# Patient Record
Sex: Female | Born: 1955 | Race: Black or African American | Hispanic: No | Smoking: Never smoker
Health system: Southern US, Community
[De-identification: ages and names within clinical notes are randomized; demographics above are authoritative.]

## PROBLEM LIST (undated history)

## (undated) DIAGNOSIS — D649 Anemia, unspecified: Secondary | ICD-10-CM

## (undated) DIAGNOSIS — I10 Essential (primary) hypertension: Secondary | ICD-10-CM

## (undated) DIAGNOSIS — D573 Sickle-cell trait: Secondary | ICD-10-CM

## (undated) DIAGNOSIS — Z9289 Personal history of other medical treatment: Secondary | ICD-10-CM

---

## 1989-08-12 HISTORY — PX: DILATION AND CURETTAGE OF UTERUS: SHX78

## 1989-08-12 HISTORY — PX: VAGINAL HYSTERECTOMY: SUR661

## 2003-12-27 ENCOUNTER — Emergency Department (HOSPITAL_COMMUNITY): Admission: EM | Admit: 2003-12-27 | Discharge: 2003-12-27 | Payer: Self-pay | Admitting: Internal Medicine

## 2007-10-02 ENCOUNTER — Ambulatory Visit: Payer: Self-pay | Admitting: Family Medicine

## 2007-10-02 LAB — CONVERTED CEMR LAB
AST: 16 units/L (ref 0–37)
Albumin: 4.2 g/dL (ref 3.5–5.2)
BUN: 15 mg/dL (ref 6–23)
Basophils Relative: 1 % (ref 0–1)
CO2: 25 meq/L (ref 19–32)
Chloride: 107 meq/L (ref 96–112)
Creatinine, Ser: 1.04 mg/dL (ref 0.40–1.20)
Eosinophils Absolute: 0.1 10*3/uL (ref 0.0–0.7)
Glucose, Bld: 82 mg/dL (ref 70–99)
HCT: 38.6 % (ref 36.0–46.0)
Hemoglobin: 12.4 g/dL (ref 12.0–15.0)
LDL Cholesterol: 129 mg/dL — ABNORMAL HIGH (ref 0–99)
Lymphocytes Relative: 45 % (ref 12–46)
Lymphs Abs: 2 10*3/uL (ref 0.7–3.3)
Neutro Abs: 2.1 10*3/uL (ref 1.7–7.7)
Sodium: 144 meq/L (ref 135–145)
TSH: 2.848 microintl units/mL (ref 0.350–5.50)
Total Protein: 8 g/dL (ref 6.0–8.3)
VLDL: 20 mg/dL (ref 0–40)

## 2007-10-30 ENCOUNTER — Ambulatory Visit: Payer: Self-pay | Admitting: *Deleted

## 2007-11-02 ENCOUNTER — Ambulatory Visit: Payer: Self-pay | Admitting: Family Medicine

## 2008-01-10 ENCOUNTER — Ambulatory Visit: Payer: Self-pay | Admitting: Family Medicine

## 2008-05-20 ENCOUNTER — Ambulatory Visit: Payer: Self-pay | Admitting: Family Medicine

## 2008-05-20 ENCOUNTER — Encounter (INDEPENDENT_AMBULATORY_CARE_PROVIDER_SITE_OTHER): Payer: Self-pay | Admitting: Family Medicine

## 2009-06-25 ENCOUNTER — Ambulatory Visit: Payer: Self-pay | Admitting: Internal Medicine

## 2009-08-10 ENCOUNTER — Ambulatory Visit: Payer: Self-pay | Admitting: Family Medicine

## 2009-12-03 ENCOUNTER — Ambulatory Visit: Payer: Self-pay | Admitting: Family Medicine

## 2010-05-06 ENCOUNTER — Ambulatory Visit: Payer: Self-pay | Admitting: Family Medicine

## 2010-10-11 ENCOUNTER — Encounter (INDEPENDENT_AMBULATORY_CARE_PROVIDER_SITE_OTHER): Payer: Self-pay | Admitting: Family Medicine

## 2010-10-11 LAB — CONVERTED CEMR LAB
Alkaline Phosphatase: 53 units/L (ref 39–117)
CO2: 27 meq/L (ref 19–32)
Calcium: 9.2 mg/dL (ref 8.4–10.5)
Chloride: 106 meq/L (ref 96–112)
Cholesterol: 187 mg/dL (ref 0–200)
Eosinophils Absolute: 0.1 10*3/uL (ref 0.0–0.7)
Eosinophils Relative: 3 % (ref 0–5)
HCT: 38.6 % (ref 36.0–46.0)
Hemoglobin: 12.1 g/dL (ref 12.0–15.0)
Monocytes Absolute: 0.3 10*3/uL (ref 0.1–1.0)
Neutro Abs: 2.3 10*3/uL (ref 1.7–7.7)
Neutrophils Relative %: 51 % (ref 43–77)
Potassium: 4.2 meq/L (ref 3.5–5.3)
RBC: 4.81 M/uL (ref 3.87–5.11)
Sodium: 143 meq/L (ref 135–145)
Total CHOL/HDL Ratio: 4.9
VLDL: 21 mg/dL (ref 0–40)
WBC: 4.5 10*3/uL (ref 4.0–10.5)

## 2011-01-03 ENCOUNTER — Encounter: Payer: Self-pay | Admitting: Family Medicine

## 2012-01-19 ENCOUNTER — Encounter (HOSPITAL_COMMUNITY): Payer: Self-pay | Admitting: Emergency Medicine

## 2012-01-19 ENCOUNTER — Emergency Department (HOSPITAL_COMMUNITY)
Admission: EM | Admit: 2012-01-19 | Discharge: 2012-01-19 | Disposition: A | Discharge status: Home / Self Care | Payer: Self-pay | Attending: Emergency Medicine | Admitting: Emergency Medicine

## 2012-01-19 DIAGNOSIS — R21 Rash and other nonspecific skin eruption: Secondary | ICD-10-CM | POA: Insufficient documentation

## 2012-01-19 DIAGNOSIS — Z79899 Other long term (current) drug therapy: Secondary | ICD-10-CM | POA: Insufficient documentation

## 2012-01-19 DIAGNOSIS — B029 Zoster without complications: Secondary | ICD-10-CM | POA: Insufficient documentation

## 2012-01-19 DIAGNOSIS — I10 Essential (primary) hypertension: Secondary | ICD-10-CM | POA: Insufficient documentation

## 2012-01-19 HISTORY — DX: Essential (primary) hypertension: I10

## 2012-01-19 MED ORDER — ACYCLOVIR 400 MG PO TABS: 400.0000 mg | ORAL_TABLET | Freq: Four times a day (QID) | ORAL | Status: AC

## 2012-01-19 MED ORDER — PREDNISONE 10 MG PO TABS: ORAL_TABLET | ORAL | Status: DC

## 2012-01-19 NOTE — ED Notes (Signed)
Pt st's she had pain in back then developed a patchy rash on right upper back

## 2012-01-19 NOTE — ED Provider Notes (Signed)
History     CSN: [id number]  Arrival date & time 01/19/12  2053   First MD Initiated Contact with Patient 01/19/12 2322      Chief Complaint  Patient presents with  . Rash     HPI  History provided by the patient. Patient is a 56 year old female with history of hypertension presents with complaints of painful rash to her right flank and side. She states that she began to have some slight pain and burning sensation to her skin 4 days ago. Symptoms are moderate. She then began to have small patches of erythema and rash to her right back and flank and under her right breast. Patient denies having similar symptoms previously. Pain is worse with palpation. She has tried using hydrocortisone cream over the area without any significant change. She denies any fever, chills, sweats.    Past Medical History  Diagnosis Date  . Hypertension     Past Surgical History  Procedure Date  . Abdominal hysterectomy     No family history on file.  History  Substance Use Topics  . Smoking status: Never Smoker   . Smokeless tobacco: Not on file  . Alcohol Use: No    OB History    Grav Para Term Preterm Abortions TAB SAB Ect Mult Living                  Review of Systems  Constitutional: Negative for fever and chills.  Cardiovascular: Negative for chest pain.  Gastrointestinal: Negative for vomiting, abdominal pain and diarrhea.  Skin: Positive for rash.  All other systems reviewed and are negative.    Allergies  Aspirin  Home Medications   Current Outpatient Rx  Name Route Sig Dispense Refill  . HYDROCORTISONE 1 % EX CREA Topical Apply 1 application topically 2 (two) times daily.    . IBUPROFEN 200 MG PO TABS Oral Take 400 mg by mouth every 6 (six) hours as needed. For pain    . LISINOPRIL-HYDROCHLOROTHIAZIDE 20-25 MG PO TABS Oral Take 1 tablet by mouth daily.      BP 131/77  Pulse 61  Temp(Src) 98.2 F (36.8 C) (Oral)  Resp 16  SpO2 97%  Physical Exam  Nursing note  and vitals reviewed. Constitutional: She is oriented to person, place, and time. She appears well-developed and well-nourished. No distress.  Neck: Normal range of motion. Neck supple.       No meningeal signs  Cardiovascular: Normal rate and regular rhythm.   Pulmonary/Chest: Effort normal and breath sounds normal. No respiratory distress. She has no wheezes.  Neurological: She is alert and oriented to person, place, and time.  Skin: Skin is warm and dry. No rash noted.       Erythematous rash a to right back and flank side with a few spots under the right breast area. No vesicles or blisters seen at this time. Skin is sensitive to touch. No induration.  Psychiatric: She has a normal mood and affect. Her behavior is normal.    ED Course  Procedures     1. Shingles       MDM  11:00 PM patient seen and evaluated. Patient no acute distress.        Angus Seller, [place] 01/20/12 [phone]

## 2012-01-20 NOTE — ED Provider Notes (Signed)
Medical screening examination/treatment/procedure(s) were performed by non-physician practitioner and as supervising physician I was immediately available for consultation/collaboration.  Nicholes Stairs, MD 01/20/12 [phone]

## 2012-11-29 ENCOUNTER — Emergency Department (INDEPENDENT_AMBULATORY_CARE_PROVIDER_SITE_OTHER)
Admission: EM | Admit: 2012-11-29 | Discharge: 2012-11-29 | Disposition: A | Discharge status: Home / Self Care | Payer: No Typology Code available for payment source | Source: Home / Self Care

## 2012-11-29 ENCOUNTER — Encounter (HOSPITAL_COMMUNITY): Payer: Self-pay

## 2012-11-29 DIAGNOSIS — I10 Essential (primary) hypertension: Secondary | ICD-10-CM

## 2012-11-29 DIAGNOSIS — R799 Abnormal finding of blood chemistry, unspecified: Secondary | ICD-10-CM

## 2012-11-29 DIAGNOSIS — R7989 Other specified abnormal findings of blood chemistry: Secondary | ICD-10-CM

## 2012-11-29 LAB — COMPREHENSIVE METABOLIC PANEL
ALT: 13 U/L (ref 0–35)
AST: 20 U/L (ref 0–37)
Alkaline Phosphatase: 56 U/L (ref 39–117)
CO2: 27 mEq/L (ref 19–32)
Chloride: 101 mEq/L (ref 96–112)
Creatinine, Ser: 1.13 mg/dL — ABNORMAL HIGH (ref 0.50–1.10)
GFR calc non Af Amer: 54 mL/min — ABNORMAL LOW (ref >90)
Potassium: 3.3 mEq/L — ABNORMAL LOW (ref 3.5–5.1)
Total Bilirubin: 0.5 mg/dL (ref 0.3–1.2)

## 2012-11-29 LAB — LIPID PANEL
LDL Cholesterol: 133 mg/dL — ABNORMAL HIGH (ref 0–99)
Triglycerides: 109 mg/dL (ref <150)
VLDL: 22 mg/dL (ref 0–40)

## 2012-11-29 MED ORDER — LISINOPRIL-HYDROCHLOROTHIAZIDE 20-25 MG PO TABS: 1.0000 | ORAL_TABLET | Freq: Every day | ORAL | Status: DC

## 2012-11-29 NOTE — ED Provider Notes (Signed)
History     CSN: [id number]  Arrival date & time 11/29/12  1508   Chief Complaint  Patient presents with  . Medication Refill   HPI Pt reports that she has been well.  No complaints.  Pt says that she was told 2 years ago that her creatinine was elevated but has not had it checked recently.  Pt also reports that her teeth need to be pulled.      Past Medical History  Diagnosis Date  . Hypertension     Past Surgical History  Procedure Date  . Abdominal hysterectomy     No family history on file.  History  Substance Use Topics  . Smoking status: Never Smoker   . Smokeless tobacco: Not on file  . Alcohol Use: No    OB History    Grav Para Term Preterm Abortions TAB SAB Ect Mult Living                  Review of Systems  Constitutional: Negative.   HENT: Negative.   Eyes: Negative.   Respiratory: Negative.   Gastrointestinal: Negative.   Musculoskeletal: Negative.   Neurological: Negative.   Psychiatric/Behavioral: Negative.   All other systems reviewed and are negative.    Allergies  Aspirin  Home Medications   Current Outpatient Rx  Name  Route  Sig  Dispense  Refill  . LISINOPRIL-HYDROCHLOROTHIAZIDE 20-25 MG PO TABS   Oral   Take 1 tablet by mouth daily.         Marland Kitchen HYDROCORTISONE 1 % EX CREA   Topical   Apply 1 application topically 2 (two) times daily.         . IBUPROFEN 200 MG PO TABS   Oral   Take 400 mg by mouth every 6 (six) hours as needed. For pain         . PREDNISONE 10 MG PO TABS      Take 6 tablets on day one, take 5 times on day 2, take Fortaz on day 3, take 3 tabs on day 4, take 2 tabs on day 5, take 1 tab on day 6   21 tablet   0     BP 136/84  Pulse 65  Temp 97.9 F (36.6 C) (Oral)  Resp 19  SpO2 100%  Physical Exam  Nursing note and vitals reviewed. Constitutional: She is oriented to person, place, and time. She appears well-developed and well-nourished. No distress.  HENT:  Head: Normocephalic and  atraumatic.  Eyes: EOM are normal. Pupils are equal, round, and reactive to light.  Neck: Normal range of motion. Neck supple.  Cardiovascular: Normal rate, regular rhythm and normal heart sounds.   Pulmonary/Chest: Effort normal and breath sounds normal.  Abdominal: Soft. Bowel sounds are normal.  Musculoskeletal: Normal range of motion. She exhibits no edema.  Neurological: She is alert and oriented to person, place, and time.  Skin: Skin is warm and dry. No erythema.  Psychiatric: She has a normal mood and affect. Her behavior is normal. Judgment and thought content normal.    ED Course  Procedures (including critical care time)  Labs Reviewed - No data to display No results found.   No diagnosis found.  MDM  IMPRESSION  hYPERTENSION  HISTORY OF ELEVATED CREATININE  DENTAL CARIES  RECOMMENDATIONS / PLAN DENTAL REFERRAL REFILLED LISINOPRIL/HCTZ 20/25 TAKE 1 PO DAILY   FOLLOW UP 3 MONTHS  The patient was given clear instructions to go to ER or return to  medical center if symptoms don't improve, worsen or new problems develop.  The patient verbalized understanding.  The patient was told to call to get lab results if they haven't heard anything in the next week.            Cleora Fleet, MD 11/29/12 [phone]

## 2012-11-29 NOTE — ED Notes (Signed)
[facility] client- needs medication refill and blood pressure checked

## 2012-12-03 ENCOUNTER — Telehealth (HOSPITAL_COMMUNITY): Payer: Self-pay

## 2012-12-03 NOTE — Telephone Encounter (Signed)
Message copied by Lestine Mount on Mon Dec 03, 2012  3:19 PM ------      Message from: Cleora Fleet      Created: Fri Nov 30, 2012  9:32 PM       Please notify patient that her kidney function is mildly diminished.  Her potassium is a little low and she should take some supplemental potassium with her blood pressure medication.  Please call in KCl 10 meq to take 1 tablet po daily, #30, RFx3.  Her cholesterol was a little elevated but not too bad.   Recheck labs in 3 months.                    Rodney Langton, MD, CDE, FAAFP      [facility]      [facility]      [place], [place]

## 2012-12-03 NOTE — Telephone Encounter (Signed)
Message copied by Lestine Mount on Mon Dec 03, 2012  3:26 PM ------      Message from: Cleora Fleet      Created: Fri Nov 30, 2012  9:32 PM       Please notify patient that her kidney function is mildly diminished.  Her potassium is a little low and she should take some supplemental potassium with her blood pressure medication.  Please call in KCl 10 meq to take 1 tablet po daily, #30, RFx3.  Her cholesterol was a little elevated but not too bad.   Recheck labs in 3 months.                    Rodney Langton, MD, CDE, FAAFP      [facility]      [facility]      [place], [place]

## 2013-06-21 ENCOUNTER — Other Ambulatory Visit (HOSPITAL_COMMUNITY): Payer: Self-pay | Admitting: Internal Medicine

## 2013-06-21 DIAGNOSIS — Z1231 Encounter for screening mammogram for malignant neoplasm of breast: Secondary | ICD-10-CM

## 2013-07-03 ENCOUNTER — Ambulatory Visit (HOSPITAL_COMMUNITY): Payer: No Typology Code available for payment source

## 2013-10-17 ENCOUNTER — Other Ambulatory Visit: Payer: Self-pay

## 2015-03-20 ENCOUNTER — Observation Stay (HOSPITAL_COMMUNITY): Payer: No Typology Code available for payment source

## 2015-03-20 ENCOUNTER — Observation Stay (HOSPITAL_COMMUNITY)
Admission: EM | Admit: 2015-03-20 | Discharge: 2015-03-21 | Disposition: A | Discharge status: Home / Self Care | Payer: Self-pay | Attending: Surgery | Admitting: Surgery

## 2015-03-20 ENCOUNTER — Encounter (HOSPITAL_COMMUNITY)
Admission: EM | Disposition: A | Discharge status: Home / Self Care | Payer: Self-pay | Source: Home / Self Care | Attending: Emergency Medicine

## 2015-03-20 ENCOUNTER — Observation Stay (HOSPITAL_COMMUNITY): Payer: Self-pay | Admitting: Anesthesiology

## 2015-03-20 ENCOUNTER — Emergency Department (HOSPITAL_COMMUNITY): Payer: Self-pay

## 2015-03-20 ENCOUNTER — Encounter (HOSPITAL_COMMUNITY): Payer: Self-pay | Admitting: Emergency Medicine

## 2015-03-20 DIAGNOSIS — J449 Chronic obstructive pulmonary disease, unspecified: Secondary | ICD-10-CM | POA: Insufficient documentation

## 2015-03-20 DIAGNOSIS — E876 Hypokalemia: Secondary | ICD-10-CM | POA: Diagnosis present

## 2015-03-20 DIAGNOSIS — Z6841 Body Mass Index (BMI) 40.0 and over, adult: Secondary | ICD-10-CM | POA: Insufficient documentation

## 2015-03-20 DIAGNOSIS — K8000 Calculus of gallbladder with acute cholecystitis without obstruction: Principal | ICD-10-CM | POA: Diagnosis present

## 2015-03-20 DIAGNOSIS — Z886 Allergy status to analgesic agent status: Secondary | ICD-10-CM | POA: Insufficient documentation

## 2015-03-20 DIAGNOSIS — K420 Umbilical hernia with obstruction, without gangrene: Secondary | ICD-10-CM | POA: Insufficient documentation

## 2015-03-20 DIAGNOSIS — F17210 Nicotine dependence, cigarettes, uncomplicated: Secondary | ICD-10-CM | POA: Insufficient documentation

## 2015-03-20 DIAGNOSIS — Z90710 Acquired absence of both cervix and uterus: Secondary | ICD-10-CM | POA: Insufficient documentation

## 2015-03-20 DIAGNOSIS — I129 Hypertensive chronic kidney disease with stage 1 through stage 4 chronic kidney disease, or unspecified chronic kidney disease: Secondary | ICD-10-CM | POA: Insufficient documentation

## 2015-03-20 DIAGNOSIS — I10 Essential (primary) hypertension: Secondary | ICD-10-CM | POA: Diagnosis present

## 2015-03-20 DIAGNOSIS — K8020 Calculus of gallbladder without cholecystitis without obstruction: Secondary | ICD-10-CM

## 2015-03-20 DIAGNOSIS — K829 Disease of gallbladder, unspecified: Secondary | ICD-10-CM

## 2015-03-20 DIAGNOSIS — R1110 Vomiting, unspecified: Secondary | ICD-10-CM

## 2015-03-20 DIAGNOSIS — N189 Chronic kidney disease, unspecified: Secondary | ICD-10-CM | POA: Diagnosis present

## 2015-03-20 HISTORY — PX: CHOLECYSTECTOMY: SHX55

## 2015-03-20 HISTORY — DX: Anemia, unspecified: D64.9

## 2015-03-20 HISTORY — DX: Personal history of other medical treatment: Z92.89

## 2015-03-20 HISTORY — PX: LAPAROSCOPIC CHOLECYSTECTOMY: SUR755

## 2015-03-20 HISTORY — DX: Sickle-cell trait: D57.3

## 2015-03-20 LAB — COMPREHENSIVE METABOLIC PANEL
ALT: 14 U/L (ref 0–35)
ANION GAP: 11 (ref 5–15)
AST: 23 U/L (ref 0–37)
Albumin: 3.7 g/dL (ref 3.5–5.2)
Alkaline Phosphatase: 51 U/L (ref 39–117)
BUN: 17 mg/dL (ref 6–23)
CALCIUM: 9 mg/dL (ref 8.4–10.5)
CO2: 25 mmol/L (ref 19–32)
Chloride: 105 mmol/L (ref 96–112)
Creatinine, Ser: 1.17 mg/dL — ABNORMAL HIGH (ref 0.50–1.10)
GFR, EST AFRICAN AMERICAN: 59 mL/min — AB (ref >90)
GFR, EST NON AFRICAN AMERICAN: 51 mL/min — AB (ref >90)
GLUCOSE: 122 mg/dL — AB (ref 70–99)
Potassium: 3.4 mmol/L — ABNORMAL LOW (ref 3.5–5.1)
Sodium: 141 mmol/L (ref 135–145)
TOTAL PROTEIN: 7.8 g/dL (ref 6.0–8.3)
Total Bilirubin: 0.8 mg/dL (ref 0.3–1.2)

## 2015-03-20 LAB — CBC WITH DIFFERENTIAL/PLATELET
Basophils Absolute: 0 10*3/uL (ref 0.0–0.1)
Basophils Relative: 0 % (ref 0–1)
EOS ABS: 0.1 10*3/uL (ref 0.0–0.7)
EOS PCT: 2 % (ref 0–5)
HEMATOCRIT: 39 % (ref 36.0–46.0)
Hemoglobin: 12.7 g/dL (ref 12.0–15.0)
LYMPHS ABS: 2 10*3/uL (ref 0.7–4.0)
LYMPHS PCT: 35 % (ref 12–46)
MCH: 25 pg — AB (ref 26.0–34.0)
MCHC: 32.6 g/dL (ref 30.0–36.0)
MCV: 76.8 fL — AB (ref 78.0–100.0)
MONO ABS: 0.3 10*3/uL (ref 0.1–1.0)
Monocytes Relative: 4 % (ref 3–12)
Neutro Abs: 3.3 10*3/uL (ref 1.7–7.7)
Neutrophils Relative %: 59 % (ref 43–77)
Platelets: 228 10*3/uL (ref 150–400)
RBC: 5.08 MIL/uL (ref 3.87–5.11)
RDW: 14.5 % (ref 11.5–15.5)
WBC: 5.7 10*3/uL (ref 4.0–10.5)

## 2015-03-20 LAB — TROPONIN I: Troponin I: <0.03 ng/mL (ref <0.031)

## 2015-03-20 LAB — LIPASE, BLOOD: Lipase: 27 U/L (ref 11–59)

## 2015-03-20 LAB — I-STAT CG4 LACTIC ACID, ED: Lactic Acid, Venous: 2.4 mmol/L (ref 0.5–2.0)

## 2015-03-20 SURGERY — LAPAROSCOPIC CHOLECYSTECTOMY WITH INTRAOPERATIVE CHOLANGIOGRAM
Anesthesia: General | Site: Abdomen

## 2015-03-20 MED ORDER — ONDANSETRON HCL 4 MG/2ML IJ SOLN
INTRAMUSCULAR | Status: DC | PRN
  Administered 2015-03-20: 4 mg via INTRAVENOUS

## 2015-03-20 MED ORDER — PHENYLEPHRINE HCL 10 MG/ML IJ SOLN
INTRAMUSCULAR | Status: DC | PRN
  Administered 2015-03-20: 80 ug via INTRAVENOUS

## 2015-03-20 MED ORDER — BUPIVACAINE HCL (PF) 0.25 % IJ SOLN
INTRAMUSCULAR | Status: AC
  Filled 2015-03-20: qty 30

## 2015-03-20 MED ORDER — LISINOPRIL 20 MG PO TABS
20.0000 mg | ORAL_TABLET | Freq: Every day | ORAL | Status: DC
  Administered 2015-03-20 – 2015-03-21 (×2): 20 mg via ORAL
  Filled 2015-03-20 (×2): qty 1

## 2015-03-20 MED ORDER — PHENOL 1.4 % MT LIQD
1.0000 | OROMUCOSAL | Status: DC | PRN
  Administered 2015-03-20 – 2015-03-21 (×2): 1 via OROMUCOSAL
  Filled 2015-03-20: qty 177

## 2015-03-20 MED ORDER — ACETAMINOPHEN 325 MG PO TABS: 650.0000 mg | ORAL_TABLET | Freq: Four times a day (QID) | ORAL | Status: DC | PRN

## 2015-03-20 MED ORDER — PROMETHAZINE HCL 25 MG/ML IJ SOLN: 6.2500 mg | INTRAMUSCULAR | Status: DC | PRN

## 2015-03-20 MED ORDER — MORPHINE SULFATE 2 MG/ML IJ SOLN: 2.0000 mg | INTRAMUSCULAR | Status: DC | PRN

## 2015-03-20 MED ORDER — ONDANSETRON HCL 4 MG/2ML IJ SOLN: 4.0000 mg | Freq: Four times a day (QID) | INTRAMUSCULAR | Status: DC | PRN

## 2015-03-20 MED ORDER — HYDROMORPHONE HCL 1 MG/ML IJ SOLN
0.2500 mg | INTRAMUSCULAR | Status: DC | PRN
  Administered 2015-03-20: 0.5 mg via INTRAVENOUS

## 2015-03-20 MED ORDER — GLYCOPYRROLATE 0.2 MG/ML IJ SOLN
INTRAMUSCULAR | Status: DC | PRN
  Administered 2015-03-20: 0.4 mg via INTRAVENOUS

## 2015-03-20 MED ORDER — ENOXAPARIN SODIUM 40 MG/0.4ML ~~LOC~~ SOLN: 40.0000 mg | SUBCUTANEOUS | Status: DC

## 2015-03-20 MED ORDER — MIDAZOLAM HCL 5 MG/5ML IJ SOLN
INTRAMUSCULAR | Status: DC | PRN
  Administered 2015-03-20: 2 mg via INTRAVENOUS

## 2015-03-20 MED ORDER — PROPOFOL 10 MG/ML IV BOLUS
INTRAVENOUS | Status: AC
  Filled 2015-03-20: qty 20

## 2015-03-20 MED ORDER — ACETAMINOPHEN 10 MG/ML IV SOLN
1000.0000 mg | Freq: Once | INTRAVENOUS | Status: AC
Start: 2015-03-20 — End: 2015-03-20
  Administered 2015-03-20: 1000 mg via INTRAVENOUS

## 2015-03-20 MED ORDER — HYDROMORPHONE HCL 1 MG/ML IJ SOLN
1.0000 mg | Freq: Once | INTRAMUSCULAR | Status: AC
  Administered 2015-03-20: 1 mg via INTRAVENOUS
  Filled 2015-03-20: qty 1

## 2015-03-20 MED ORDER — ACETAMINOPHEN 10 MG/ML IV SOLN
INTRAVENOUS | Status: AC
  Filled 2015-03-20: qty 100

## 2015-03-20 MED ORDER — HYOSCYAMINE SULFATE 0.125 MG PO TABS
0.1250 mg | ORAL_TABLET | Freq: Once | ORAL | Status: DC
  Filled 2015-03-20: qty 1

## 2015-03-20 MED ORDER — SODIUM CHLORIDE 0.9 % IV SOLN
INTRAVENOUS | Status: DC | PRN
  Administered 2015-03-20: 14 mL

## 2015-03-20 MED ORDER — SUCCINYLCHOLINE CHLORIDE 20 MG/ML IJ SOLN
INTRAMUSCULAR | Status: DC | PRN
  Administered 2015-03-20: 160 mg via INTRAVENOUS

## 2015-03-20 MED ORDER — ACETAMINOPHEN 650 MG RE SUPP: 650.0000 mg | Freq: Four times a day (QID) | RECTAL | Status: DC | PRN

## 2015-03-20 MED ORDER — FENTANYL CITRATE 0.05 MG/ML IJ SOLN
INTRAMUSCULAR | Status: DC | PRN
  Administered 2015-03-20 (×3): 50 ug via INTRAVENOUS
  Administered 2015-03-20: 100 ug via INTRAVENOUS

## 2015-03-20 MED ORDER — HYDROMORPHONE HCL 1 MG/ML IJ SOLN
INTRAMUSCULAR | Status: AC
  Filled 2015-03-20: qty 1

## 2015-03-20 MED ORDER — PROPOFOL 10 MG/ML IV BOLUS
INTRAVENOUS | Status: DC | PRN
  Administered 2015-03-20: 200 mg via INTRAVENOUS

## 2015-03-20 MED ORDER — 0.9 % SODIUM CHLORIDE (POUR BTL) OPTIME
TOPICAL | Status: DC | PRN
  Administered 2015-03-20: 1000 mL

## 2015-03-20 MED ORDER — BUPIVACAINE HCL 0.25 % IJ SOLN
INTRAMUSCULAR | Status: DC | PRN
  Administered 2015-03-20: 30 mL

## 2015-03-20 MED ORDER — FENTANYL CITRATE 0.05 MG/ML IJ SOLN
INTRAMUSCULAR | Status: AC
  Filled 2015-03-20: qty 5

## 2015-03-20 MED ORDER — HYDROMORPHONE HCL 1 MG/ML IJ SOLN
1.0000 mg | Freq: Once | INTRAMUSCULAR | Status: AC
  Administered 2015-03-20: 0.5 mg via INTRAVENOUS
  Filled 2015-03-20: qty 1

## 2015-03-20 MED ORDER — PROMETHAZINE HCL 25 MG/ML IJ SOLN
25.0000 mg | Freq: Once | INTRAMUSCULAR | Status: AC
  Administered 2015-03-20: 25 mg via INTRAVENOUS
  Filled 2015-03-20: qty 1

## 2015-03-20 MED ORDER — ROCURONIUM BROMIDE 100 MG/10ML IV SOLN
INTRAVENOUS | Status: DC | PRN
  Administered 2015-03-20: 40 mg via INTRAVENOUS

## 2015-03-20 MED ORDER — ONDANSETRON HCL 4 MG/2ML IJ SOLN
4.0000 mg | Freq: Once | INTRAMUSCULAR | Status: AC
  Administered 2015-03-20: 4 mg via INTRAVENOUS
  Filled 2015-03-20: qty 2

## 2015-03-20 MED ORDER — LACTATED RINGERS IV SOLN: INTRAVENOUS | Status: DC

## 2015-03-20 MED ORDER — LIDOCAINE HCL (CARDIAC) 20 MG/ML IV SOLN
INTRAVENOUS | Status: DC | PRN
  Administered 2015-03-20: 50 mg via INTRAVENOUS

## 2015-03-20 MED ORDER — KCL IN DEXTROSE-NACL 20-5-0.9 MEQ/L-%-% IV SOLN
INTRAVENOUS | Status: DC
  Administered 2015-03-20 – 2015-03-21 (×2): via INTRAVENOUS
  Filled 2015-03-20 (×3): qty 1000

## 2015-03-20 MED ORDER — DEXTROSE 5 % IV SOLN
2.0000 g | INTRAVENOUS | Status: DC
  Administered 2015-03-20: 2 g via INTRAVENOUS
  Filled 2015-03-20: qty 2

## 2015-03-20 MED ORDER — LACTATED RINGERS IV SOLN
INTRAVENOUS | Status: DC | PRN
  Administered 2015-03-20 (×2): via INTRAVENOUS

## 2015-03-20 MED ORDER — HYDROCHLOROTHIAZIDE 25 MG PO TABS
25.0000 mg | ORAL_TABLET | Freq: Every day | ORAL | Status: DC
  Administered 2015-03-20 – 2015-03-21 (×2): 25 mg via ORAL
  Filled 2015-03-20 (×2): qty 1

## 2015-03-20 MED ORDER — MEPERIDINE HCL 25 MG/ML IJ SOLN: 6.2500 mg | INTRAMUSCULAR | Status: DC | PRN

## 2015-03-20 MED ORDER — HYOSCYAMINE SULFATE 0.125 MG SL SUBL
0.1250 mg | SUBLINGUAL_TABLET | Freq: Once | SUBLINGUAL | Status: AC
Start: 2015-03-20 — End: 2015-03-20
  Administered 2015-03-20: 0.125 mg via ORAL

## 2015-03-20 MED ORDER — HYDROCODONE-ACETAMINOPHEN 5-325 MG PO TABS
1.0000 | ORAL_TABLET | ORAL | Status: DC | PRN
  Administered 2015-03-20 – 2015-03-21 (×2): 2 via ORAL
  Filled 2015-03-20 (×2): qty 2

## 2015-03-20 MED ORDER — KCL IN DEXTROSE-NACL 20-5-0.9 MEQ/L-%-% IV SOLN
INTRAVENOUS | Status: DC
  Administered 2015-03-20: 09:00:00 via INTRAVENOUS
  Filled 2015-03-20 (×3): qty 1000

## 2015-03-20 MED ORDER — LISINOPRIL-HYDROCHLOROTHIAZIDE 20-25 MG PO TABS: 1.0000 | ORAL_TABLET | Freq: Every day | ORAL | Status: DC

## 2015-03-20 MED ORDER — MORPHINE SULFATE 4 MG/ML IJ SOLN
4.0000 mg | Freq: Once | INTRAMUSCULAR | Status: AC
Start: 2015-03-20 — End: 2015-03-20
  Administered 2015-03-20: 4 mg via INTRAVENOUS
  Filled 2015-03-20: qty 1

## 2015-03-20 MED ORDER — MIDAZOLAM HCL 2 MG/2ML IJ SOLN
INTRAMUSCULAR | Status: AC
  Filled 2015-03-20: qty 2

## 2015-03-20 MED ORDER — NEOSTIGMINE METHYLSULFATE 10 MG/10ML IV SOLN
INTRAVENOUS | Status: DC | PRN
  Administered 2015-03-20: 3 mg via INTRAVENOUS

## 2015-03-20 MED ORDER — SODIUM CHLORIDE 0.9 % IR SOLN
Status: DC | PRN
  Administered 2015-03-20: 1000 mL

## 2015-03-20 SURGICAL SUPPLY — 57 items
APPLIER CLIP 5 13 M/L LIGAMAX5 (MISCELLANEOUS)
APPLIER CLIP ROT 10 11.4 M/L (STAPLE)
APR CLP MED LRG 11.4X10 (STAPLE)
APR CLP MED LRG 5 ANG JAW (MISCELLANEOUS)
BAG SPEC RTRVL LRG 6X4 10 (ENDOMECHANICALS) ×1
BLADE SURG ROTATE 9660 (MISCELLANEOUS) IMPLANT
CANISTER SUCTION 2500CC (MISCELLANEOUS) ×3 IMPLANT
CHLORAPREP W/TINT 26ML (MISCELLANEOUS) ×3 IMPLANT
CHOLANGIOGRAM CATH TAUT (CATHETERS) ×3 IMPLANT
CLIP APPLIE 5 13 M/L LIGAMAX5 (MISCELLANEOUS) IMPLANT
CLIP APPLIE ROT 10 11.4 M/L (STAPLE) IMPLANT
COVER MAYO STAND STRL (DRAPES) ×3 IMPLANT
COVER SURGICAL LIGHT HANDLE (MISCELLANEOUS) ×3 IMPLANT
DEVICE TROCAR PUNCTURE CLOSURE (ENDOMECHANICALS) ×2 IMPLANT
DRAPE C-ARM 42X72 X-RAY (DRAPES) ×3 IMPLANT
DRAPE LAPAROSCOPIC ABDOMINAL (DRAPES) ×3 IMPLANT
ELECT REM PT RETURN 9FT ADLT (ELECTROSURGICAL) ×3
ELECTRODE REM PT RTRN 9FT ADLT (ELECTROSURGICAL) ×1 IMPLANT
FILTER SMOKE EVAC LAPAROSHD (FILTER) ×3 IMPLANT
GLOVE BIOGEL PI IND STRL 6.5 (GLOVE) IMPLANT
GLOVE BIOGEL PI IND STRL 7.5 (GLOVE) IMPLANT
GLOVE BIOGEL PI IND STRL 8 (GLOVE) IMPLANT
GLOVE BIOGEL PI INDICATOR 6.5 (GLOVE) ×2
GLOVE BIOGEL PI INDICATOR 7.5 (GLOVE) ×2
GLOVE BIOGEL PI INDICATOR 8 (GLOVE) ×2
GLOVE ECLIPSE 6.5 STRL STRAW (GLOVE) ×2 IMPLANT
GLOVE ECLIPSE 7.0 STRL STRAW (GLOVE) ×2 IMPLANT
GLOVE SURG SIGNA 7.5 PF LTX (GLOVE) ×3 IMPLANT
GLOVE SURG SS PI 7.5 STRL IVOR (GLOVE) ×2 IMPLANT
GOWN STRL REUS W/ TWL LRG LVL3 (GOWN DISPOSABLE) ×2 IMPLANT
GOWN STRL REUS W/ TWL XL LVL3 (GOWN DISPOSABLE) ×1 IMPLANT
GOWN STRL REUS W/TWL LRG LVL3 (GOWN DISPOSABLE) ×9
GOWN STRL REUS W/TWL XL LVL3 (GOWN DISPOSABLE) ×3
IV CATH 14GX2 1/4 (CATHETERS) ×3 IMPLANT
KIT BASIN OR (CUSTOM PROCEDURE TRAY) ×3 IMPLANT
KIT ROOM TURNOVER OR (KITS) ×3 IMPLANT
LIQUID BAND (GAUZE/BANDAGES/DRESSINGS) ×3 IMPLANT
NS IRRIG 1000ML POUR BTL (IV SOLUTION) ×3 IMPLANT
PAD ARMBOARD 7.5X6 YLW CONV (MISCELLANEOUS) ×3 IMPLANT
POUCH SPECIMEN RETRIEVAL 10MM (ENDOMECHANICALS) ×3 IMPLANT
SCISSORS LAP 5X35 DISP (ENDOMECHANICALS) ×3 IMPLANT
SET IRRIG TUBING LAPAROSCOPIC (IRRIGATION / IRRIGATOR) ×3 IMPLANT
SLEEVE ENDOPATH XCEL 5M (ENDOMECHANICALS) ×3 IMPLANT
SPECIMEN JAR SMALL (MISCELLANEOUS) ×3 IMPLANT
STAPLER VISISTAT 35W (STAPLE) ×2 IMPLANT
STOPCOCK 4 WAY LG BORE MALE ST (IV SETS) ×3 IMPLANT
SUT MNCRL AB 4-0 PS2 18 (SUTURE) ×2 IMPLANT
SUT MON AB 5-0 PS2 18 (SUTURE) ×3 IMPLANT
SUT VICRYL 0 UR6 27IN ABS (SUTURE) ×2 IMPLANT
TOWEL OR 17X24 6PK STRL BLUE (TOWEL DISPOSABLE) ×3 IMPLANT
TOWEL OR 17X26 10 PK STRL BLUE (TOWEL DISPOSABLE) ×3 IMPLANT
TRAY LAPAROSCOPIC (CUSTOM PROCEDURE TRAY) ×3 IMPLANT
TROCAR XCEL BLUNT TIP 100MML (ENDOMECHANICALS) ×3 IMPLANT
TROCAR XCEL NON-BLD 11X100MML (ENDOMECHANICALS) ×2 IMPLANT
TROCAR XCEL NON-BLD 5MMX100MML (ENDOMECHANICALS) ×3 IMPLANT
TUBING EXTENTION W/L.L. (IV SETS) ×3 IMPLANT
TUBING INSUFFLATION (TUBING) ×3 IMPLANT

## 2015-03-20 NOTE — ED Provider Notes (Signed)
CSN: [id number]     Arrival date & time 03/20/15  0158 History  This chart was scribed for Marisa Severin, MD by Annye Asa, ED Scribe. This patient was seen in room B15C/B15C and the patient's care was started at 2:26 AM.    Chief Complaint  Patient presents with  . Chest Pain   Patient is a 59 y.o. female presenting with chest pain. The history is provided by the patient. No language interpreter was used.  Chest Pain Associated symptoms: dizziness, nausea, shortness of breath and vomiting    HPI Comments: Aeliana Spates is a 59 y.o. female who presents to the Emergency Department complaining of gradually worsening, constant "sharp" chest pain beginning around 23:00. Patient localizes her pain in her epigastric region, radiating across her right upper flank and into her back; it is exacerbated with applied pressure. She also notes SOB, dizziness and vomiting (3x, beginning en route). Per EMS, patient received  Zofran IM en route; no ASA given due to allergy.   She is a nonsmoker. Prior surgical history includes abdominal hysterectomy; still has both appendix and gallbladder. She denies personal history of gallbladder concerns.   Past Medical History  Diagnosis Date  . Hypertension    Past Surgical History  Procedure Laterality Date  . Abdominal hysterectomy     History reviewed. No pertinent family history. History  Substance Use Topics  . Smoking status: Never Smoker   . Smokeless tobacco: Not on file  . Alcohol Use: No   OB History    No data available     Review of Systems  Respiratory: Positive for shortness of breath.   Cardiovascular: Positive for chest pain.  Gastrointestinal: Positive for nausea and vomiting.  Neurological: Positive for dizziness.  All other systems reviewed and are negative.  Allergies  Aspirin  Home Medications   Prior to Admission medications   Medication Sig Start Date End Date Taking? Authorizing Provider  lisinopril-hydrochlorothiazide  (PRINZIDE,ZESTORETIC) 20-25 MG per tablet Take 1 tablet by mouth daily. 11/29/12  Yes Clanford L Johnson, MD   BP 143/99 mmHg  Pulse 86  Resp 21  SpO2 99% Physical Exam  Constitutional: She is oriented to person, place, and time. She appears well-developed and well-nourished. No distress.  HENT:  Head: Normocephalic and atraumatic.  Mouth/Throat: Oropharynx is clear and moist. No oropharyngeal exudate.  Moist mucous membranes  Eyes: EOM are normal. Pupils are equal, round, and reactive to light.  Neck: Normal range of motion. Neck supple. No JVD present.  Cardiovascular: Normal rate, regular rhythm and normal heart sounds.  Exam reveals no gallop and no friction rub.   No murmur heard. Pulmonary/Chest: Effort normal and breath sounds normal. No respiratory distress. She has no wheezes. She has no rales.  Abdominal: Soft. Bowel sounds are normal. She exhibits no mass. There is tenderness (Epigastrium, LUQ and RUQ). There is no rebound and no guarding.  Musculoskeletal: Normal range of motion. She exhibits no edema.  Moves all extremities normally.   Lymphadenopathy:    She has no cervical adenopathy.  Neurological: She is alert and oriented to person, place, and time. She displays normal reflexes.  Skin: Skin is warm and dry. No rash noted.  Psychiatric: She has a normal mood and affect. Her behavior is normal.  Nursing note and vitals reviewed.   ED Course  Procedures   DIAGNOSTIC STUDIES: Oxygen Saturation is 99% on 2L Carlos, normal by my interpretation.    COORDINATION OF CARE: 2:33 AM Discussed treatment plan  with pt at bedside and pt agreed to plan.   Labs Review Labs Reviewed  COMPREHENSIVE METABOLIC PANEL - Abnormal; Notable for the following:    Potassium 3.4 (*)    Glucose, Bld 122 (*)    Creatinine, Ser 1.17 (*)    GFR calc non Af Amer 51 (*)    GFR calc Af Amer 59 (*)    All other components within normal limits  CBC WITH DIFFERENTIAL/PLATELET - Abnormal; Notable  for the following:    MCV 76.8 (*)    MCH 25.0 (*)    All other components within normal limits  I-STAT CG4 LACTIC ACID, ED - Abnormal; Notable for the following:    Lactic Acid, Venous 2.40 (*)    All other components within normal limits  TROPONIN I  LIPASE, BLOOD    Imaging Review [place] Abdomen Limited  03/20/2015   CLINICAL DATA:  Right upper quadrant pain.  EXAM: US ABDOMEN LIMITED - RIGHT UPPER QUADRANT  COMPARISON:  None.  FINDINGS: Gallbladder:  Multiple stones are demonstrated throughout the dependent portion of the gallbladder. Largest measures about 1.5 cm diameter. No gallbladder wall thickening, sludge, or edema. Murphy's sign is negative.  Common bile duct:  Not visualized.  No visible intrahepatic bile duct dilatation.  Liver:  Diffusely increased parenchymal echotexture suggesting fatty infiltration. Several cysts are demonstrated in the liver, largest measuring about 2.2 cm maximal diameter.  Examination is technically limited due to patient body habitus, bowel gas, and patient's movement.  IMPRESSION: Cholelithiasis without additional changes to suggest cholecystitis. Diffuse fatty infiltration of the liver. Multiple hepatic cysts. Bile ducts not identified.   Electronically Signed   By: Burman NievesWilliam  Stevens M.D.   On: 03/20/2015 04:05     EKG Interpretation   Date/Time:  Friday March 20 2015 [id number]:10:[id number]:24 EDT Ventricular Rate:  80 PR Interval:    QRS Duration: 97 QT Interval:  360 QTC Calculation: 415 R Axis:   32 Text Interpretation:  Normal sinus rhythm Abnormal R-wave progression,  early transition No old tracing to compare Confirmed by Lakeia Bradshaw  MD, Tarquin Welcher  ([id number]) on 03/20/2015 2:34:19 AM      MDM   Final diagnoses:  Cholelithiasis without cholecystitis  Intractable vomiting with nausea, vomiting of unspecified type    I personally performed the services described in this documentation, which was scribed in my presence. The recorded information has been reviewed and is  accurate.   Pt with acute onset of upper abdominal pain tonight when lying down at 11 pm.  N/v, diaphoresis.  Pain with palpation of RUQ, epigastrium.  Plan for labs, pain/nausea medications and RUQ u/s.  5:36 AM Cholelithiasis without cholecystitis on u/s.  Labs without elevated wbc or lfts.      6:23 AM Pt has had persistent nausea and vomiting.  Will d/w surgery for their evaluation.  Marisa Severinlga Madesyn Ast, MD 03/20/15 [phone]

## 2015-03-20 NOTE — ED Notes (Signed)
Pt brought in by EMS. Pt started experiencing chest pain last night sometime after 11 PM. Pain is in the epigastric region, and the pt describes the pain as sharp. Pt reporting SOb and dizziness. Pt only started vomiting en route. Pt received 4mg  Zofran IM en route. Pt did not receive ASA because she has an allergy.

## 2015-03-20 NOTE — Anesthesia Preprocedure Evaluation (Addendum)
Anesthesia Evaluation  Patient identified by MRN, date of birth, ID band Patient awake    Reviewed: Allergy & Precautions, NPO status , Patient's Chart, lab work & pertinent test results  Airway Mallampati: II  TM Distance: >3 FB Neck ROM: Full    Dental no notable dental hx.    Pulmonary COPD COPD inhaler, Current Smoker,  breath sounds clear to auscultation  Pulmonary exam normal       Cardiovascular hypertension, Pt. on medications Rhythm:Regular Rate:Normal     Neuro/Psych negative neurological ROS  negative psych ROS   GI/Hepatic negative GI ROS, Neg liver ROS,   Endo/Other  Morbid obesity  Renal/GU negative Renal ROS     Musculoskeletal negative musculoskeletal ROS (+)   Abdominal   Peds  Hematology negative hematology ROS (+)   Anesthesia Other Findings   Reproductive/Obstetrics negative OB ROS                            Anesthesia Physical Anesthesia Plan  ASA: III  Anesthesia Plan: General   Post-op Pain Management:    Induction: Intravenous  Airway Management Planned: Oral ETT  Additional Equipment: None  Intra-op Plan:   Post-operative Plan: Extubation in OR  Informed Consent: I have reviewed the patients History and Physical, chart, labs and discussed the procedure including the risks, benefits and alternatives for the proposed anesthesia with the patient or authorized representative who has indicated his/her understanding and acceptance.   Dental advisory given  Plan Discussed with: CRNA  Anesthesia Plan Comments:        Anesthesia Quick Evaluation

## 2015-03-20 NOTE — Op Note (Signed)
03/20/2015  1:29 PM  PATIENT:  Rhonda Payne, 59 y.o., female, MRN: [id number]  PREOP DIAGNOSIS:  cholelithiasis  POSTOP DIAGNOSIS:   Acute edematous cholecystitis, cholelithiasis, umbilical hernia with incarcerated fat  PROCEDURE:   Procedure(s): LAPAROSCOPIC CHOLECYSTECTOMY WITH INTRAOPERATIVE CHOLANGIOGRAM  SURGEON:   Rhonda Payne, M.D.  ASSISTANT:   Barnetta Chapel, PA  ANESTHESIA:   general  Anesthesiologist: Judie Petit, MD; Lewie Loron, MD CRNA: Sheppard Evens, CRNA  General  ASA: 3  EBL:  minimal  ml  BLOOD ADMINISTERED: none  DRAINS: none   LOCAL MEDICATIONS USED:   25 cc 1/4 % marcaine  SPECIMEN:   Gall bladder  COUNTS CORRECT:  YES  INDICATIONS FOR PROCEDURE:  Rhonda Payne is a 59 y.o. (DOB: 1956/07/05) AA  female whose primary care physician is Pcp Not In System and comes for cholecystectomy.   She came in through the [facility] ER today with the signs and symptoms of acute cholecystitis.   The indications and risks of the gall bladder surgery were explained to the patient.  The risks include, but are not limited to, infection, bleeding, common bile duct injury and open surgery.  SURGERY:  The patient was taken to room #1 at [facility] OR.  The abdomen was prepped with chloroprep.  The patient was given 2 gm ceftriaxone in the ER.   A time out was held and the surgical checklist run.   An infraumbilical incision was made into the abdominal cavity.  A 12 mm Hasson trocar was inserted into the abdominal cavity through the infraumbilical incision and secured with a 0 Vicryl suture.  Three additional trocars were inserted: a 10 mm trocar in the sub-xiphoid location, a 5 mm trocar in the right mid subcostal area, and a 5 mm trocar in the right lateral subcostal area.   The abdomen was explored and the liver, stomach, and bowel that could be seen were unremarkable.  For her obesity, her liver did not look bad.  She does have omentum  incarcerated in an umbilical hernia.  I left this alone during the operatin.   The gall bladder was edematous.  I grasped, the gall bladder and rotated it cephalad.  Disssection was carried down to the gall bladder/cystic duct junction and the cystic duct isolated.  There was a stone impacted at the neck of the gall bladder and cystic duct.    A clip was placed on the gall bladder side of the cystic duct.   An intra-operative cholangiogram was shot.   The intra-operative cholangiogram was shot using a cut off Taut catheter placed through a 14 gauge angiocath in the RUQ.  The Taut catheter was inserted in the cut cystic duct and secured with an endoclip.  A cholangiogram was shot with 12 cc of 1/2 strength Omnipaque.  Using fluoroscopy, the cholangiogram showed the flow of contrast into the common bile duct, up the hepatic radicals, and into the duodenum.  There was no mass or obstruction.  This was a normal intra-operative cholangiogram.   The Taut catheter was removed.  The cystic duct was tripley endoclipped and the cystic artery was identified and clipped.  The gall bladder was bluntly and sharpley dissected from the gall bladder bed.   After the gall bladder was removed from the liver, the gall bladder bed and [place] were inspected.  There was no bleeding or bile leak.  The gall bladder was placed in a endocatch bag and delivered through the umbilicus.  The  abdomen was irrigated with 1,000 cc saline.   The trocars were then removed.  I infiltrated 25 cc of 1/4% Marcaine into the incisions.  The umbilical port closed with a 0 Vicryl suture and the skin closed with 5-0 Monocryl.  The skin was painted with Dermabond.  The patient's sponge and needle count were correct.  The patient was transported to the RR in good condition.  Rhonda Kinavid Alyla Pietila, MD, [facility] Pager: [phone] Office phone:  [phone]

## 2015-03-20 NOTE — ED Notes (Signed)
OR ready for pt. Taking to bay 36.

## 2015-03-20 NOTE — Anesthesia Procedure Notes (Signed)
Procedure Name: Intubation Date/Time: 03/20/2015 11:53 AM Performed by: Arlice ColtMANESS, Ashling Roane B Pre-anesthesia Checklist: Patient identified, Emergency Drugs available, Suction available, Patient being monitored and Timeout performed Patient Re-evaluated:Patient Re-evaluated prior to inductionOxygen Delivery Method: Circle system utilized Preoxygenation: Pre-oxygenation with 100% oxygen Intubation Type: IV induction and Rapid sequence Laryngoscope Size: Mac and 3 Grade View: Grade I Tube type: Oral Tube size: 7.5 mm Number of attempts: 1 Airway Equipment and Method: Stylet Placement Confirmation: ETT inserted through vocal cords under direct vision,  positive ETCO2 and breath sounds checked- equal and bilateral Secured at: 21 cm Tube secured with: Tape Dental Injury: Teeth and Oropharynx as per pre-operative assessment

## 2015-03-20 NOTE — Anesthesia Postprocedure Evaluation (Signed)
  Anesthesia Post-op Note  Patient: Rhonda Payne  Procedure(s) Performed: Procedure(s): LAPAROSCOPIC CHOLECYSTECTOMY WITH INTRAOPERATIVE CHOLANGIOGRAM (N/A)  Patient Location: PACU  Anesthesia Type:General  Level of Consciousness: awake  Airway and Oxygen Therapy: Patient Spontanous Breathing  Post-op Pain: mild  Post-op Assessment: Post-op Vital signs reviewed  Post-op Vital Signs: Reviewed  Last Vitals:  Filed Vitals:   03/20/15 1430  BP: 122/83  Pulse: 82  Temp:   Resp: 15    Complications: No apparent anesthesia complications

## 2015-03-20 NOTE — ED Notes (Signed)
Per surgery NP, do not send pt to the floor. OR will be coming down to get her. Surgeon also coming down to talk with patient. Pt made aware.

## 2015-03-20 NOTE — H&P (Signed)
Chief Complaint: epigastric abdominal pain HPI: Rhonda Payne is 59 year old female with a history of obesity and hypertension who presents with epigastric abdominal pain.  Onset was sudden last night at about 2300.  She had hamburgers and ice cream for dinner.  She denies previous symptoms.  Onset was sudden.  Coarse is unchanged.  Severe in severity.  Time pattern is constant.  Associated with nausea and vomiting.  She denies fever, chills or sweats.  Denies melena or hematochezia.  Denies recent weight loss.  Denies chest pains or shortness of breath.  Denies a cardiac history and can ambulate up a flight of stairs without shortness of breath. Her work up shows cholelithiasis by abdominal US, normal LFTs, white count was normal.  Scr 1.17 appears her baseline is 1.23 and a potassium of 3.4. We have been asked to evaluate for symptomatic gallstones.  She has been NPO since last evening.    Past Medical History  Diagnosis Date  . Hypertension     Past Surgical History  Procedure Laterality Date  . Abdominal hysterectomy      History reviewed. No pertinent family history. Social History:  reports that she has never smoked. She does not have any smokeless tobacco history on file. She reports that she does not drink alcohol or use illicit drugs.  Allergies:  Allergies  Allergen Reactions  . Aspirin Rash and Other (See Comments)    Caused pain and rash   Medication: Prior to Admission medications   Medication Sig Start Date End Date Taking? Authorizing Provider  lisinopril-hydrochlorothiazide (PRINZIDE,ZESTORETIC) 20-25 MG per tablet Take 1 tablet by mouth daily. 11/29/12  Yes Clanford Marisa Hua, MD     (Not in a hospital admission)  Results for orders placed or performed during the hospital encounter of 03/20/15 (from the past 48 hour(s))  Comprehensive metabolic panel     Status: Abnormal   Collection Time: 03/20/15  2:44 AM  Result Value Ref Range   Sodium 141 135 - 145  mmol/L   Potassium 3.4 (L) 3.5 - 5.1 mmol/L   Chloride 105 96 - 112 mmol/L   CO2 25 19 - 32 mmol/L   Glucose, Bld 122 (H) 70 - 99 mg/dL   BUN 17 6 - 23 mg/dL   Creatinine, Ser 1.17 (H) 0.50 - 1.10 mg/dL   Calcium 9.0 8.4 - 10.5 mg/dL   Total Protein 7.8 6.0 - 8.3 g/dL   Albumin 3.7 3.5 - 5.2 g/dL   AST 23 0 - 37 U/L   ALT 14 0 - 35 U/L   Alkaline Phosphatase 51 39 - 117 U/L   Total Bilirubin 0.8 0.3 - 1.2 mg/dL   GFR calc non Af Amer 51 (L) >90 mL/min   GFR calc Af Amer 59 (L) >90 mL/min    Comment: (NOTE) The eGFR has been calculated using the CKD EPI equation. This calculation has not been validated in all clinical situations. eGFR's persistently <90 mL/min signify possible Chronic Kidney Disease.    Anion gap 11 5 - 15  Troponin I     Status: None   Collection Time: 03/20/15  2:44 AM  Result Value Ref Range   Troponin I <0.03 <0.031 ng/mL    Comment:        NO INDICATION OF MYOCARDIAL INJURY.   CBC with Differential     Status: Abnormal   Collection Time: 03/20/15  2:44 AM  Result Value Ref Range   WBC 5.7 4.0 - 10.5 K/uL  RBC 5.08 3.87 - 5.11 MIL/uL   Hemoglobin 12.7 12.0 - 15.0 g/dL   HCT 39.0 36.0 - 46.0 %   MCV 76.8 (L) 78.0 - 100.0 fL   MCH 25.0 (L) 26.0 - 34.0 pg   MCHC 32.6 30.0 - 36.0 g/dL   RDW 14.5 11.5 - 15.5 %   Platelets 228 150 - 400 K/uL   Neutrophils Relative % 59 43 - 77 %   Neutro Abs 3.3 1.7 - 7.7 K/uL   Lymphocytes Relative 35 12 - 46 %   Lymphs Abs 2.0 0.7 - 4.0 K/uL   Monocytes Relative 4 3 - 12 %   Monocytes Absolute 0.3 0.1 - 1.0 K/uL   Eosinophils Relative 2 0 - 5 %   Eosinophils Absolute 0.1 0.0 - 0.7 K/uL   Basophils Relative 0 0 - 1 %   Basophils Absolute 0.0 0.0 - 0.1 K/uL  Lipase, blood     Status: None   Collection Time: 03/20/15  2:44 AM  Result Value Ref Range   Lipase 27 11 - 59 U/L  I-Stat CG4 Lactic Acid, ED     Status: Abnormal   Collection Time: 03/20/15  3:30 AM  Result Value Ref Range   Lactic Acid, Venous 2.40  (HH) 0.5 - 2.0 mmol/L   Comment NOTIFIED PHYSICIAN    US Abdomen Limited  03/20/2015   CLINICAL DATA:  Right upper quadrant pain.  EXAM: US ABDOMEN LIMITED - RIGHT UPPER QUADRANT  COMPARISON:  None.  FINDINGS: Gallbladder:  Multiple stones are demonstrated throughout the dependent portion of the gallbladder. Largest measures about 1.5 cm diameter. No gallbladder wall thickening, sludge, or edema. Murphy's sign is negative.  Common bile duct:  Not visualized.  No visible intrahepatic bile duct dilatation.  Liver:  Diffusely increased parenchymal echotexture suggesting fatty infiltration. Several cysts are demonstrated in the liver, largest measuring about 2.2 cm maximal diameter.  Examination is technically limited due to patient body habitus, bowel gas, and patient's movement.  IMPRESSION: Cholelithiasis without additional changes to suggest cholecystitis. Diffuse fatty infiltration of the liver. Multiple hepatic cysts. Bile ducts not identified.   Electronically Signed   By: Lucienne Capers M.D.   On: 03/20/2015 04:05    Review of Systems  All other systems reviewed and are negative.   Blood pressure 120/79, pulse 63, temperature 98 F (36.7 C), temperature source Oral, resp. rate 15, SpO2 100 %. Physical Exam  Constitutional: She is oriented to person, place, and time. She appears well-developed and well-nourished. She appears distressed.  Cardiovascular: Normal rate, regular rhythm, normal heart sounds and intact distal pulses.  Exam reveals no gallop and no friction rub.   No murmur heard. Respiratory: Effort normal and breath sounds normal. No respiratory distress. She has no wheezes. She has no rales. She exhibits no tenderness.  GI: Soft. Bowel sounds are normal. She exhibits no distension and no mass. There is no rebound and no guarding.  +murphy's sign  Musculoskeletal: Normal range of motion. She exhibits no edema or tenderness.  Neurological: She is alert and oriented to person,  place, and time.  Skin: Skin is warm. No rash noted. She is not diaphoretic. No erythema. No pallor.  Psychiatric: She has a normal mood and affect. Her behavior is normal. Judgment and thought content normal.     Assessment/Plan Cholelithiasis with possible early cholecystitis -admit -laparoscopic cholecystectomy with IOC today -NPO -risks of surgery discussed including infection, bleeding, injury to surrounding structures, anesthesia risks.  She verbalizes  understanding and wishes to proceed. -obtain a consent -start rocephin -pain control and anti-emetics -SCDs, lovenox post OP -IVF Hypokalemia -KCL in IVF, repeat labs in AM Chronic renal insufficiency -hydrate, avoid nephrotoxins Hypertension -BP is a little soft, give IVF and resume home meds tomorrow if blood pressure is stable   RIEBOCK, EMINA ANP-BC 03/20/2015, 8:45 AM   Agree with above.  Her husband is in the room.  She has children in town, but they are not here.  She does not work.  Her only prior abdominal surgery was a hysterectomy.  She sees Dr. Marlou Sa on [facility].  She is somewhat undecided about surgery.  I told her it was her choice, but most likely the gall bladder would continue to bother her.  I discussed with the patient the indications and risks of gall bladder surgery.  The primary risks of gall bladder surgery include, but are not limited to, bleeding, infection, common bile duct injury, and open surgery.  There is also the risk that the patient may have continued symptoms after surgery.   We discussed the typical post-operative recovery course. I tried to answer the patient's questions.  Alphonsa Overall, MD, [facility] Surgery Pager: [phone] Office phone:  [phone]

## 2015-03-20 NOTE — Transfer of Care (Signed)
Immediate Anesthesia Transfer of Care Note  Patient: Rhonda Payne  Procedure(s) Performed: Procedure(s): LAPAROSCOPIC CHOLECYSTECTOMY WITH INTRAOPERATIVE CHOLANGIOGRAM (N/A)  Patient Location: PACU  Anesthesia Type:General  Level of Consciousness: awake, alert  and oriented  Airway & Oxygen Therapy: Patient Spontanous Breathing and Patient connected to nasal cannula oxygen  Post-op Assessment: Report given to RN and Post -op Vital signs reviewed and stable  Post vital signs: Reviewed and stable  Last Vitals:  Filed Vitals:   03/20/15 1400  BP: 160/68  Pulse: 85  Temp: 36.4 C  Resp: 16    Complications: No apparent anesthesia complications

## 2015-03-21 LAB — BASIC METABOLIC PANEL
Anion gap: 8 (ref 5–15)
BUN: 10 mg/dL (ref 6–23)
CALCIUM: 8.8 mg/dL (ref 8.4–10.5)
CHLORIDE: 106 mmol/L (ref 96–112)
CO2: 26 mmol/L (ref 19–32)
CREATININE: 1.16 mg/dL — AB (ref 0.50–1.10)
GFR calc Af Amer: 59 mL/min — ABNORMAL LOW (ref >90)
GFR calc non Af Amer: 51 mL/min — ABNORMAL LOW (ref >90)
GLUCOSE: 106 mg/dL — AB (ref 70–99)
Potassium: 3.3 mmol/L — ABNORMAL LOW (ref 3.5–5.1)
SODIUM: 140 mmol/L (ref 135–145)

## 2015-03-21 MED ORDER — HYDROCODONE-ACETAMINOPHEN 5-325 MG PO TABS: 1.0000 | ORAL_TABLET | ORAL | Status: DC | PRN

## 2015-03-21 MED ORDER — POTASSIUM CHLORIDE 20 MEQ PO PACK
20.0000 meq | PACK | Freq: Once | ORAL | Status: AC
Start: 2015-03-21 — End: 2015-03-21
  Administered 2015-03-21: 20 meq via ORAL
  Filled 2015-03-21 (×2): qty 1

## 2015-03-21 NOTE — Progress Notes (Signed)
UR completed 

## 2015-03-21 NOTE — Progress Notes (Signed)
Pt ready for discharge to home with family.  DC instructions given and reviewed and Rx given for Vicodin and reviewed.  Family can asssist pt at home as needed.  FU with [facility] for 2 weeks, # given to call on Monday to make appt.  No further questions verbalized about home self care.

## 2015-03-21 NOTE — Progress Notes (Signed)
Patient ID: Rhonda Payne, female   DOB: 08-20-1956, 59 y.o.   MRN: [id number] 1 Day Post-Op  Subjective: Very sore but no severe pain. No nausea, tolerating breakfast. Has been getting up and down to the bathroom. No other complaints.  Objective: Vital signs in last 24 hours: Temp:  [97.6 F (36.4 C)-99.1 F (37.3 C)] 98.4 F (36.9 C) (04/09 0554) Pulse Rate:  [57-85] 83 (04/09 0554) Resp:  [13-25] 17 (04/09 0554) BP: (105-130)/(68-88) 129/79 mmHg (04/09 0554) SpO2:  [92 %-100 %] 95 % (04/09 0554) Weight:  [122.2 kg (269 lb 6.4 oz)] 122.2 kg (269 lb 6.4 oz) (04/08 1500) Last BM Date: 03/19/15  Intake/Output from previous day: 04/08 0701 - 04/09 0700 In: 2858.8 [P.O.:360; I.V.:2498.8] Out: -  Intake/Output this shift:    General appearance: alert, cooperative, no distress and morbidly obese GI: mild appropriate incisional tenderness Incision/Wound: clean and dry without evidence of infection  Lab Results:   Recent Labs  03/20/15 0244  WBC 5.7  HGB 12.7  HCT 39.0  PLT 228   BMET  Recent Labs  03/20/15 0244 03/21/15 0536  NA 141 140  K 3.4* 3.3*  CL 105 106  CO2 25 26  GLUCOSE 122* 106*  BUN 17 10  CREATININE 1.17* 1.16*  CALCIUM 9.0 8.8     Studies/Results: Dg Cholangiogram Operative  03/20/2015   CLINICAL DATA:  Intraoperative cholangiogram for cholelithiasis  EXAM: INTRAOPERATIVE CHOLANGIOGRAM  TECHNIQUE: Cholangiographic images from the C-arm fluoroscopic device were submitted for interpretation post-operatively. Please see the procedural report for the amount of contrast and the fluoroscopy time utilized.  COMPARISON:  None.  FINDINGS: Contrast fills the biliary tree and duodenum without filling defect ending common bile ducts.  IMPRESSION: Patent biliary tree without evidence of common bile duct stones.   Electronically Signed   By: Jolaine ClickArthur  Hoss M.D.   On: 03/20/2015 13:46   [place] Abdomen Limited  03/20/2015   CLINICAL DATA:  Right upper quadrant pain.   EXAM: US ABDOMEN LIMITED - RIGHT UPPER QUADRANT  COMPARISON:  None.  FINDINGS: Gallbladder:  Multiple stones are demonstrated throughout the dependent portion of the gallbladder. Largest measures about 1.5 cm diameter. No gallbladder wall thickening, sludge, or edema. Murphy's sign is negative.  Common bile duct:  Not visualized.  No visible intrahepatic bile duct dilatation.  Liver:  Diffusely increased parenchymal echotexture suggesting fatty infiltration. Several cysts are demonstrated in the liver, largest measuring about 2.2 cm maximal diameter.  Examination is technically limited due to patient body habitus, bowel gas, and patient's movement.  IMPRESSION: Cholelithiasis without additional changes to suggest cholecystitis. Diffuse fatty infiltration of the liver. Multiple hepatic cysts. Bile ducts not identified.   Electronically Signed   By: Burman NievesWilliam  Stevens M.D.   On: 03/20/2015 04:05    Anti-infectives: Anti-infectives    Start     Dose/Rate Route Frequency Ordered Stop   03/20/15 0845  cefTRIAXone (ROCEPHIN) 2 g in dextrose 5 % 50 mL IVPB  Status:  Discontinued    Comments:  Pharmacy may adjust dosing strength / duration / interval for maximal efficacy   2 g 100 mL/hr over 30 Minutes Intravenous Every 24 hours 03/20/15 0830 03/20/15 1521      Assessment/Plan: s/p Procedure(s): LAPAROSCOPIC CHOLECYSTECTOMY WITH INTRAOPERATIVE CHOLANGIOGRAM Doing well postoperatively without apparent complication. Okay for discharge.      Herve Haug T 03/21/2015

## 2015-03-21 NOTE — Discharge Instructions (Signed)
[facility] ______[facility] °LAPAROSCOPIC SURGERY: POST OP INSTRUCTIONS °Always review your discharge instruction sheet given to you by the facility where your surgery was performed. °IF YOU HAVE DISABILITY OR FAMILY LEAVE FORMS, YOU MUST BRING THEM TO THE OFFICE FOR PROCESSING.   °DO NOT GIVE THEM TO YOUR DOCTOR. ° °1. A prescription for pain medication may be given to you upon discharge.  Take your pain medication as prescribed, if needed.  If narcotic pain medicine is not needed, then you may take acetaminophen (Tylenol) or ibuprofen (Advil) as needed. °2. Take your usually prescribed medications unless otherwise directed. °3. If you need a refill on your pain medication, please contact your pharmacy.  They will contact our office to request authorization. Prescriptions will not be filled after 5pm or on week-ends. °4. You should follow a light diet the first few days after arrival home, such as soup and crackers, etc.  Be sure to include lots of fluids daily. °5. Most patients will experience some swelling and bruising in the area of the incisions.  Ice packs will help.  Swelling and bruising can take several days to resolve.  °6. It is common to experience some constipation if taking pain medication after surgery.  Increasing fluid intake and taking a stool softener (such as Colace) will usually help or prevent this problem from occurring.  A mild laxative (Milk of Magnesia or Miralax) should be taken according to package instructions if there are no bowel movements after 48 hours. °7. Unless discharge instructions indicate otherwise, you may remove your bandages 24-48 hours after surgery, and you may shower at that time.  You may have steri-strips (small skin tapes) in place directly over the incision.  These strips should be left on the skin for 7-10 days.  If your surgeon used skin glue on the incision, you may shower in 24 hours.  The glue will flake off over the next 2-3 weeks.  Any sutures or  staples will be removed at the office during your follow-up visit. °8. ACTIVITIES:  You may resume regular (light) daily activities beginning the next day--such as daily self-care, walking, climbing stairs--gradually increasing activities as tolerated.  You may have sexual intercourse when it is comfortable.  Refrain from any heavy lifting or straining until approved by your doctor. °a. You may drive when you are no longer taking prescription pain medication, you can comfortably wear a seatbelt, and you can safely maneuver your car and apply brakes. °b. RETURN TO WORK:  __________________________________________________________ °9. You should see your doctor in the office for a follow-up appointment approximately 2-3 weeks after your surgery.  Make sure that you call for this appointment within a day or two after you arrive home to insure a convenient appointment time. °10. OTHER INSTRUCTIONS: __________________________________________________________________________________________________________________________ __________________________________________________________________________________________________________________________ °WHEN TO CALL YOUR DOCTOR: °1. Fever over 101.0 °2. Inability to urinate °3. Continued bleeding from incision. °4. Increased pain, redness, or drainage from the incision. °5. Increasing abdominal pain ° °The clinic staff is available to answer your questions during regular business hours.  Please don’t hesitate to call and ask to speak to one of the nurses for clinical concerns.  If you have a medical emergency, go to the nearest emergency room or call 911.  A surgeon from [facility] is always on call at the hospital. °[address], [address], [place], [place]  [address] ? [address] [address], [place], [place]   [address] °[phone] ? [phone] ? FAX [phone] °Web site:   [email or web address] °

## 2015-03-24 ENCOUNTER — Encounter (HOSPITAL_COMMUNITY): Payer: Self-pay | Admitting: Surgery

## 2015-03-30 NOTE — Discharge Summary (Signed)
Patient ID: Rhonda Payne MRN: [id number] DOB/AGE: June 07, 1956 59 y.o.  Admit date: 03/20/2015 Discharge date: 03/21/2015  Procedures: lap chole with IOC  Consults: None  Reason for Admission: Rhonda Payne is 59 year old female with a history of obesity and hypertension who presents with epigastric abdominal pain. Onset was sudden last night at about 2300. She had hamburgers and ice cream for dinner. She denies previous symptoms. Onset was sudden. Coarse is unchanged. Severe in severity. Time pattern is constant. Associated with nausea and vomiting. She denies fever, chills or sweats. Denies melena or hematochezia. Denies recent weight loss. Denies chest pains or shortness of breath. Denies a cardiac history and can ambulate up a flight of stairs without shortness of breath. Her work up shows cholelithiasis by abdominal US, normal LFTs, white count was normal. Scr 1.17 appears her baseline is 1.23 and a potassium of 3.4. We have been asked to evaluate for symptomatic gallstones.  She has been NPO since last evening.   Admission Diagnoses:  1. Cholelithiasis, possible early cholecystitis 2. HTN 3. CRI  Hospital Course: THe patient was admitted and taken to the OR where she underwent a lap chole with IOC.  The patient tolerated this well.  She was tolerating a regular diet and her pain was well controlled on POD 1.  She was stable for dc home.  Discharge Diagnoses:  Principal Problem:   Cholelithiasis with acute cholecystitis Active Problems:   Chronic renal insufficiency   Essential hypertension   Hypokalemia   Discharge Medications:   Medication List    TAKE these medications        HYDROcodone-acetaminophen 5-325 MG per tablet  Commonly known as:  NORCO/VICODIN  Take 1-2 tablets by mouth every 4 (four) hours as needed for moderate pain.     lisinopril-hydrochlorothiazide 20-25 MG per tablet  Commonly known as:  PRINZIDE,ZESTORETIC  Take 1 tablet by mouth  daily.        Discharge Instructions:     Follow-up Information    [facility] Schedule an appointment as soon as possible for a visit in 2 weeks.   Specialty:  General Surgery   Contact information:   [address] [place] [address] [phone]       Signed: Letha CapeOSBORNE,Jaleel Allen E 03/30/2015, 9:39 AM

## 2015-05-25 ENCOUNTER — Emergency Department (HOSPITAL_COMMUNITY)
Admission: EM | Admit: 2015-05-25 | Discharge: 2015-05-25 | Disposition: A | Discharge status: Home / Self Care | Payer: No Typology Code available for payment source | Attending: Emergency Medicine | Admitting: Emergency Medicine

## 2015-05-25 ENCOUNTER — Emergency Department (HOSPITAL_COMMUNITY): Payer: No Typology Code available for payment source

## 2015-05-25 ENCOUNTER — Encounter (HOSPITAL_COMMUNITY): Payer: Self-pay | Admitting: Family Medicine

## 2015-05-25 DIAGNOSIS — I10 Essential (primary) hypertension: Secondary | ICD-10-CM | POA: Insufficient documentation

## 2015-05-25 DIAGNOSIS — Z862 Personal history of diseases of the blood and blood-forming organs and certain disorders involving the immune mechanism: Secondary | ICD-10-CM | POA: Insufficient documentation

## 2015-05-25 DIAGNOSIS — F419 Anxiety disorder, unspecified: Secondary | ICD-10-CM | POA: Insufficient documentation

## 2015-05-25 DIAGNOSIS — Z79899 Other long term (current) drug therapy: Secondary | ICD-10-CM | POA: Insufficient documentation

## 2015-05-25 DIAGNOSIS — R0602 Shortness of breath: Secondary | ICD-10-CM | POA: Insufficient documentation

## 2015-05-25 DIAGNOSIS — R079 Chest pain, unspecified: Secondary | ICD-10-CM | POA: Insufficient documentation

## 2015-05-25 DIAGNOSIS — M546 Pain in thoracic spine: Secondary | ICD-10-CM

## 2015-05-25 LAB — CBC
HEMATOCRIT: 36.2 % (ref 36.0–46.0)
HEMOGLOBIN: 11.9 g/dL — AB (ref 12.0–15.0)
MCH: 24.9 pg — AB (ref 26.0–34.0)
MCHC: 32.9 g/dL (ref 30.0–36.0)
MCV: 75.7 fL — ABNORMAL LOW (ref 78.0–100.0)
Platelets: 257 10*3/uL (ref 150–400)
RBC: 4.78 MIL/uL (ref 3.87–5.11)
RDW: 15 % (ref 11.5–15.5)
WBC: 4.6 10*3/uL (ref 4.0–10.5)

## 2015-05-25 LAB — BASIC METABOLIC PANEL WITH GFR
Anion gap: 11 (ref 5–15)
BUN: 9 mg/dL (ref 6–20)
CO2: 22 mmol/L (ref 22–32)
Calcium: 9.5 mg/dL (ref 8.9–10.3)
Chloride: 109 mmol/L (ref 101–111)
Creatinine, Ser: 1.17 mg/dL — ABNORMAL HIGH (ref 0.44–1.00)
GFR calc Af Amer: 59 mL/min — ABNORMAL LOW
GFR calc non Af Amer: 51 mL/min — ABNORMAL LOW
Glucose, Bld: 95 mg/dL (ref 65–99)
Potassium: 3.2 mmol/L — ABNORMAL LOW (ref 3.5–5.1)
Sodium: 142 mmol/L (ref 135–145)

## 2015-05-25 LAB — I-STAT TROPONIN, ED: TROPONIN I, POC: 0 ng/mL (ref 0.00–0.08)

## 2015-05-25 LAB — TROPONIN I: Troponin I: <0.03 ng/mL

## 2015-05-25 MED ORDER — LORAZEPAM 1 MG PO TABS: 1.0000 mg | ORAL_TABLET | Freq: Three times a day (TID) | ORAL | Status: DC | PRN

## 2015-05-25 NOTE — Discharge Instructions (Signed)
Take ativan as needed for anxiety. Refer to attached documents for more information. Return to the ED with worsening or concerning symptoms.

## 2015-05-25 NOTE — ED Notes (Signed)
pt sts she was talking to her son this am and became upset. sts she got a pain in her back that radiated to chest and SOB. Pt crying. sts sharp pain.

## 2015-05-25 NOTE — ED Provider Notes (Signed)
CSN: [id number]     Arrival date & time 05/25/15  1022 History   First MD Initiated Contact with Patient 05/25/15 1129     Chief Complaint  Patient presents with  . Back Pain  . Chest Pain     (Consider location/radiation/quality/duration/timing/severity/associated sxs/prior Treatment) HPI Comments: Patient is a 59 year old female with a past medical history of hypertension and sickle cell trait who presents with back pain that started suddenly prior to arrival. The pain is sharp and starts in her central upper back and radiates to her central chest. The pain is severe and lasted about 30 minutes and gradually started to improve. She reports associated SOB. Patient's symptoms started after having an emotional conversation with her son. No aggravating/alleviating factors. No other associated symptoms.    Past Medical History  Diagnosis Date  . Hypertension   . Sickle cell trait   . History of blood transfusion     "when I was a child; related to sickle cell trait"  . Anemia    Past Surgical History  Procedure Laterality Date  . Laparoscopic cholecystectomy  03/20/2015    w/IOC  . Vaginal hysterectomy  1990's  . Dilation and curettage of uterus  1990's  . Cholecystectomy N/A 03/20/2015    Procedure: LAPAROSCOPIC CHOLECYSTECTOMY WITH INTRAOPERATIVE CHOLANGIOGRAM;  Surgeon: Ovidio Kin, MD;  Location: [facility] OR;  Service: General;  Laterality: N/A;   History reviewed. No pertinent family history. History  Substance Use Topics  . Smoking status: Never Smoker   . Smokeless tobacco: Never Used  . Alcohol Use: No   OB History    No data available     Review of Systems  Constitutional: Negative for fever, chills and fatigue.  HENT: Negative for trouble swallowing.   Eyes: Negative for visual disturbance.  Respiratory: Positive for shortness of breath.   Cardiovascular: Positive for chest pain. Negative for palpitations.  Gastrointestinal: Negative for nausea, vomiting, abdominal pain  and diarrhea.  Genitourinary: Negative for dysuria and difficulty urinating.  Musculoskeletal: Positive for back pain. Negative for arthralgias and neck pain.  Skin: Negative for color change.  Neurological: Negative for dizziness and weakness.  Psychiatric/Behavioral: Negative for dysphoric mood.      Allergies  Aspirin  Home Medications   Prior to Admission medications   Medication Sig Start Date End Date Taking? Authorizing Provider  lisinopril-hydrochlorothiazide (PRINZIDE,ZESTORETIC) 20-25 MG per tablet Take 1 tablet by mouth daily. 11/29/12  Yes Cl[place] Cyndie Mull, MD  HYDROcodone-acetaminophen (NORCO/VICODIN) 5-325 MG per tablet Take 1-2 tablets by mouth every 4 (four) hours as needed for moderate pain. Patient not taking: Reported on 05/25/2015 03/21/15   Glenna Fellows, MD   BP 111/71 mmHg  Pulse 69  Temp(Src) 98.1 F (36.7 C) (Oral)  Resp 18  SpO2 100% Physical Exam  Constitutional: She is oriented to person, place, and time. She appears well-developed and well-nourished. No distress.  HENT:  Head: Normocephalic and atraumatic.  Eyes: Conjunctivae and EOM are normal.  Neck: Normal range of motion.  Cardiovascular: Normal rate and regular rhythm.  Exam reveals no gallop and no friction rub.   No murmur heard. Pulmonary/Chest: Effort normal and breath sounds normal. She has no wheezes. She has no rales. She exhibits no tenderness.  Abdominal: Soft. She exhibits no distension. There is no tenderness. There is no rebound.  Musculoskeletal: Normal range of motion.  No midline spine tenderness to palpation.   Neurological: She is alert and oriented to person, place, and time. Coordination normal.  Speech is goal-oriented. Moves limbs without ataxia.   Skin: Skin is warm and dry.  Psychiatric: She has a normal mood and affect. Her behavior is normal.  Nursing note and vitals reviewed.   ED Course  Procedures (including critical care time) Labs Review Labs Reviewed   CBC - Abnormal; Notable for the following:    Hemoglobin 11.9 (*)    MCV 75.7 (*)    MCH 24.9 (*)    All other components within normal limits  BASIC METABOLIC PANEL - Abnormal; Notable for the following:    Potassium 3.2 (*)    Creatinine, Ser 1.17 (*)    GFR calc non Af Amer 51 (*)    GFR calc Af Amer 59 (*)    All other components within normal limits  TROPONIN I  I-STAT TROPOININ, ED    Imaging Review Dg Chest 2 View  05/25/2015   CLINICAL DATA:  Mid chest pain, shortness of breath, anxiety, dizziness, history hypertension, sickle cell trait  EXAM: CHEST  2 VIEW  COMPARISON:  None  FINDINGS: Normal heart size, mediastinal contours, and pulmonary vascularity.  Atherosclerotic calcification aorta.  Minimal central peribronchial thickening.  Lungs clear.  No pleural effusion or pneumothorax.  No acute osseous findings.  IMPRESSION: Minimal bronchitic changes without infiltrate.   Electronically Signed   By: Ulyses Southward M.D.   On: 05/25/2015 11:07     EKG Interpretation   Date/Time:  Monday May 25 2015 10:31:07 EDT Ventricular Rate:  73 PR Interval:  164 QRS Duration: 94 QT Interval:  382 QTC Calculation: 420 R Axis:   13 Text Interpretation:  Sinus rhythm with marked sinus arrhythmia Otherwise  normal ECG Confirmed by Rubin Payor  MD, Harrold Donath [phone]) on 05/25/2015 3:01:35  PM      MDM   Final diagnoses:  Midline thoracic back pain  Chest pain, unspecified chest pain type  Anxiety    11:56 AM  Labs pending. Chest xray unremarkable for acute changes.   3:18 PM Delta trop unremarkable. Patient feeling better. Patient's heart score 2. Patient attributes her symptoms to emotional upset or anxiety and is similar to previous episodes. Patient will have ativan for anxiety. Patient has PCP follow up. Vitals stable and patient afebrile. Patient instructed to return with worsening or concerning symptoms.   Emilia Beck, PA-C 05/25/15 1527  Benjiman Core, MD 05/28/15  [phone]

## 2015-05-25 NOTE — Progress Notes (Signed)
[facility] & Eligibility Specialist Partnership for [facility] [phone]  Spoke to patient in regarding to her [facility] orange card. Patient currently established with care at [facility] [place]. Pt does have an upcoming appointment with her pcp June 29,2016 @ 1:30pm, I spoke with pts pcp in attempts to gain a closer appointment but unsuccessful. PCP explained to call in the afternoon for cancellations. Pt will be linked with a [facility] case manager upon discharge. My contact information provided for any future questions or concerns. No other [facility]ligibility Specialist needs identified at this time.

## 2019-07-19 ENCOUNTER — Ambulatory Visit: Payer: Self-pay | Attending: Family Medicine | Admitting: Family Medicine

## 2019-07-19 ENCOUNTER — Encounter: Payer: Self-pay | Admitting: Family Medicine

## 2019-07-19 ENCOUNTER — Other Ambulatory Visit: Payer: Self-pay

## 2019-07-19 VITALS — Ht 65.5 in

## 2019-07-19 DIAGNOSIS — I10 Essential (primary) hypertension: Secondary | ICD-10-CM

## 2019-07-19 MED ORDER — LISINOPRIL-HYDROCHLOROTHIAZIDE 20-25 MG PO TABS: 1.0000 | ORAL_TABLET | Freq: Every day | ORAL | 4 refills | Status: DC

## 2019-07-19 NOTE — Progress Notes (Signed)
Virtual Visit via Telephone Note  I connected with Rhonda Payne 07/19/19 at 4:45 PM EDT by telephone and verified that I am speaking with the correct person using two identifiers.   I discussed the limitations, risks, security and privacy concerns of performing an evaluation and management service by telephone and the availability of in person appointments. I also discussed with the patient that there may be a patient responsible charge related to this service. The patient expressed understanding and agreed to proceed.  Patient Location: Home Provider Location: [facility] Others participating in call: Call initiated by Emilio Aspen, RMA who then transferred the call to me   History of Present Illness:       63 year old female new to the practice who states that she is currently uninsured and needs refills of her blood pressure medication.  She states that she did have some old pills at home and she has been taking those and occasionally skipping a day's dose in order to stretch out the medication.  She denies any headaches or dizziness related to her blood pressure.  She has not checked her blood pressure recently but states that her blood pressure has always been controlled on this medication in the past.  She denies any other current issues.  No chest pain or palpitations, no shortness of breath or cough, no fever or chills, no abdominal pain-no nausea or vomiting.   Past Medical History:  Diagnosis Date  . Anemia   . History of blood transfusion    "when I was a child; related to sickle cell trait"  . Hypertension   . Sickle cell trait [facility])     Past Surgical History:  Procedure Laterality Date  . CHOLECYSTECTOMY N/A 03/20/2015   Procedure: LAPAROSCOPIC CHOLECYSTECTOMY WITH INTRAOPERATIVE CHOLANGIOGRAM;  Surgeon: Alphonsa Overall, MD;  Location: [place];  Service: General;  Laterality: N/A;  . DILATION AND CURETTAGE OF UTERUS  1990's  . LAPAROSCOPIC CHOLECYSTECTOMY  03/20/2015    w/IOC  . VAGINAL HYSTERECTOMY  1990's    Family History  Problem Relation Age of Onset  . Diabetes Mother   . Cancer Father        no sure but thinks it's prostate    Social History   Tobacco Use  . Smoking status: Never Smoker  . Smokeless tobacco: Never Used  Substance Use Topics  . Alcohol use: No  . Drug use: No     Allergies  Allergen Reactions  . Aspirin Rash and Other (See Comments)    Caused pain and rash       Observations/Objective: No vital signs or physical exam conducted as visit was done via telephone  Assessment and Plan: 1. Essential hypertension She reports that her blood pressure is stable and controlled with the use of her current medication lisinopril-HCTZ 25-25 and new prescription was sent to [place] with refills.  Discussed with the patient that on the review of her chart she also has a past medical history of renal insufficiency but has not had blood work per chart since 2016 at which time her creatinine was 1.17 and renal insufficiency was discussed with the patient.  Patient was asked to make a follow-up appointment in 6 to 7 weeks and in the meantime meet with the financial counselors in order to see if she qualifies for discount program for medical follow-up and medications.  At her follow-up appointment, will recheck blood work in follow-up of renal insufficiency as well as patient with history of anemia/sickle cell  trait.  Per chart, her last hemoglobin was near normal at 11.9.  Patient is encouraged to remain well-hydrated, avoid the use of nonsteroidal anti-inflammatories and continue blood pressure medication to help decrease renal insufficiency/chronic kidney disease. - lisinopril-hydrochlorothiazide (ZESTORETIC) 20-25 MG tablet; Take 1 tablet by mouth daily.  Dispense: 30 tablet; Refill: 4  Follow Up Instructions:Return in about 6 weeks (around 08/30/2019) for Hypertension/renal insufficiency/anemia .    I discussed the assessment and  treatment plan with the patient. The patient was provided an opportunity to ask questions and all were answered. The patient agreed with the plan and demonstrated an understanding of the instructions.   The patient was advised to call back or seek an in-person evaluation if the symptoms worsen or if the condition fails to improve as anticipated.  I provided 7 minutes of non-face-to-face time during this encounter.   Cain Saupeammie Valerye Kobus, MD

## 2019-07-19 NOTE — Progress Notes (Signed)
Per pt she is a New Patient and do not having any insurance. Per pt she need med refills

## 2019-07-23 ENCOUNTER — Inpatient Hospital Stay (HOSPITAL_COMMUNITY)
Admission: EM | Admit: 2019-07-23 | Discharge: 2019-08-03 | DRG: 354 | Disposition: A | Discharge status: Home Health | Payer: Self-pay | Attending: General Surgery | Admitting: General Surgery

## 2019-07-23 ENCOUNTER — Encounter (HOSPITAL_COMMUNITY): Admission: EM | Disposition: A | Discharge status: Home Health | Payer: Self-pay | Source: Home / Self Care

## 2019-07-23 ENCOUNTER — Encounter (HOSPITAL_COMMUNITY): Payer: Self-pay | Admitting: Emergency Medicine

## 2019-07-23 ENCOUNTER — Other Ambulatory Visit: Payer: Self-pay

## 2019-07-23 ENCOUNTER — Observation Stay (HOSPITAL_COMMUNITY): Payer: Self-pay | Admitting: Certified Registered"

## 2019-07-23 ENCOUNTER — Emergency Department (HOSPITAL_COMMUNITY): Payer: Self-pay

## 2019-07-23 DIAGNOSIS — Z809 Family history of malignant neoplasm, unspecified: Secondary | ICD-10-CM

## 2019-07-23 DIAGNOSIS — E876 Hypokalemia: Secondary | ICD-10-CM | POA: Diagnosis not present

## 2019-07-23 DIAGNOSIS — D573 Sickle-cell trait: Secondary | ICD-10-CM | POA: Diagnosis present

## 2019-07-23 DIAGNOSIS — Y839 Surgical procedure, unspecified as the cause of abnormal reaction of the patient, or of later complication, without mention of misadventure at the time of the procedure: Secondary | ICD-10-CM | POA: Diagnosis not present

## 2019-07-23 DIAGNOSIS — K460 Unspecified abdominal hernia with obstruction, without gangrene: Secondary | ICD-10-CM | POA: Diagnosis present

## 2019-07-23 DIAGNOSIS — K91872 Postprocedural seroma of a digestive system organ or structure following a digestive system procedure: Secondary | ICD-10-CM | POA: Diagnosis not present

## 2019-07-23 DIAGNOSIS — Z90710 Acquired absence of both cervix and uterus: Secondary | ICD-10-CM

## 2019-07-23 DIAGNOSIS — K430 Incisional hernia with obstruction, without gangrene: Principal | ICD-10-CM | POA: Diagnosis present

## 2019-07-23 DIAGNOSIS — Z833 Family history of diabetes mellitus: Secondary | ICD-10-CM

## 2019-07-23 DIAGNOSIS — Z886 Allergy status to analgesic agent status: Secondary | ICD-10-CM

## 2019-07-23 DIAGNOSIS — N179 Acute kidney failure, unspecified: Secondary | ICD-10-CM | POA: Diagnosis present

## 2019-07-23 DIAGNOSIS — N182 Chronic kidney disease, stage 2 (mild): Secondary | ICD-10-CM | POA: Diagnosis present

## 2019-07-23 DIAGNOSIS — K439 Ventral hernia without obstruction or gangrene: Secondary | ICD-10-CM

## 2019-07-23 DIAGNOSIS — Z20828 Contact with and (suspected) exposure to other viral communicable diseases: Secondary | ICD-10-CM | POA: Diagnosis present

## 2019-07-23 DIAGNOSIS — K219 Gastro-esophageal reflux disease without esophagitis: Secondary | ICD-10-CM | POA: Diagnosis not present

## 2019-07-23 DIAGNOSIS — R140 Abdominal distension (gaseous): Secondary | ICD-10-CM

## 2019-07-23 DIAGNOSIS — Z6841 Body Mass Index (BMI) 40.0 and over, adult: Secondary | ICD-10-CM

## 2019-07-23 DIAGNOSIS — I129 Hypertensive chronic kidney disease with stage 1 through stage 4 chronic kidney disease, or unspecified chronic kidney disease: Secondary | ICD-10-CM | POA: Diagnosis present

## 2019-07-23 DIAGNOSIS — Z9049 Acquired absence of other specified parts of digestive tract: Secondary | ICD-10-CM

## 2019-07-23 DIAGNOSIS — K567 Ileus, unspecified: Secondary | ICD-10-CM | POA: Diagnosis not present

## 2019-07-23 HISTORY — PX: LAPAROSCOPY: SHX197

## 2019-07-23 HISTORY — DX: Unspecified abdominal hernia with obstruction, without gangrene: K46.0

## 2019-07-23 LAB — COMPREHENSIVE METABOLIC PANEL
ALT: 13 U/L (ref 0–44)
AST: 21 U/L (ref 15–41)
Albumin: 3.8 g/dL (ref 3.5–5.0)
Alkaline Phosphatase: 47 U/L (ref 38–126)
Anion gap: 8 (ref 5–15)
BUN: 11 mg/dL (ref 8–23)
CO2: 25 mmol/L (ref 22–32)
Calcium: 9.2 mg/dL (ref 8.9–10.3)
Chloride: 107 mmol/L (ref 98–111)
Creatinine, Ser: 1.08 mg/dL — ABNORMAL HIGH (ref 0.44–1.00)
GFR calc Af Amer: >60 mL/min (ref >60)
GFR calc non Af Amer: 55 mL/min — ABNORMAL LOW (ref >60)
Glucose, Bld: 107 mg/dL — ABNORMAL HIGH (ref 70–99)
Potassium: 4 mmol/L (ref 3.5–5.1)
Sodium: 140 mmol/L (ref 135–145)
Total Bilirubin: 1 mg/dL (ref 0.3–1.2)
Total Protein: 7.8 g/dL (ref 6.5–8.1)

## 2019-07-23 LAB — URINALYSIS, ROUTINE W REFLEX MICROSCOPIC
Bacteria, UA: NONE SEEN
Bilirubin Urine: NEGATIVE
Glucose, UA: 50 mg/dL — AB
Ketones, ur: 5 mg/dL — AB
Leukocytes,Ua: NEGATIVE
Nitrite: NEGATIVE
Protein, ur: NEGATIVE mg/dL
Specific Gravity, Urine: >1.046 — ABNORMAL HIGH (ref 1.005–1.030)
pH: 7 (ref 5.0–8.0)

## 2019-07-23 LAB — TYPE AND SCREEN
ABO/RH(D): O POS
Antibody Screen: NEGATIVE

## 2019-07-23 LAB — CBC
HCT: 44.9 % (ref 36.0–46.0)
Hemoglobin: 14.2 g/dL (ref 12.0–15.0)
MCH: 25.8 pg — ABNORMAL LOW (ref 26.0–34.0)
MCHC: 31.6 g/dL (ref 30.0–36.0)
MCV: 81.6 fL (ref 80.0–100.0)
Platelets: 236 10*3/uL (ref 150–400)
RBC: 5.5 MIL/uL — ABNORMAL HIGH (ref 3.87–5.11)
RDW: 14 % (ref 11.5–15.5)
WBC: 7 10*3/uL (ref 4.0–10.5)
nRBC: 0 % (ref 0.0–0.2)

## 2019-07-23 LAB — ABO/RH: ABO/RH(D): O POS

## 2019-07-23 LAB — SARS CORONAVIRUS 2 BY RT PCR (HOSPITAL ORDER, PERFORMED IN ~~LOC~~ HOSPITAL LAB): SARS Coronavirus 2: NEGATIVE

## 2019-07-23 LAB — LIPASE, BLOOD: Lipase: 23 U/L (ref 11–51)

## 2019-07-23 SURGERY — LAPAROSCOPY, DIAGNOSTIC
Anesthesia: General

## 2019-07-23 MED ORDER — FENTANYL CITRATE (PF) 250 MCG/5ML IJ SOLN
INTRAMUSCULAR | Status: DC | PRN
  Administered 2019-07-23 (×5): 50 ug via INTRAVENOUS

## 2019-07-23 MED ORDER — PANTOPRAZOLE SODIUM 40 MG IV SOLR
40.0000 mg | Freq: Every day | INTRAVENOUS | Status: DC
  Administered 2019-07-23 – 2019-08-02 (×10): 40 mg via INTRAVENOUS
  Filled 2019-07-23 (×9): qty 40

## 2019-07-23 MED ORDER — HYDROMORPHONE HCL 1 MG/ML IJ SOLN
1.0000 mg | INTRAMUSCULAR | Status: DC | PRN
  Administered 2019-07-23 – 2019-07-25 (×3): 1 mg via INTRAVENOUS
  Administered 2019-07-27: 0.5 mg via INTRAVENOUS
  Administered 2019-07-27 – 2019-07-31 (×7): 1 mg via INTRAVENOUS
  Filled 2019-07-23 (×12): qty 1

## 2019-07-23 MED ORDER — PHENYLEPHRINE 40 MCG/ML (10ML) SYRINGE FOR IV PUSH (FOR BLOOD PRESSURE SUPPORT)
PREFILLED_SYRINGE | INTRAVENOUS | Status: DC | PRN
  Administered 2019-07-23: 120 ug via INTRAVENOUS
  Administered 2019-07-23: 80 ug via INTRAVENOUS
  Administered 2019-07-23 (×2): 120 ug via INTRAVENOUS

## 2019-07-23 MED ORDER — PROPOFOL 10 MG/ML IV BOLUS
INTRAVENOUS | Status: DC | PRN
  Administered 2019-07-23: 200 mg via INTRAVENOUS

## 2019-07-23 MED ORDER — SODIUM CHLORIDE 0.9 % IV SOLN
2.0000 g | INTRAVENOUS | Status: AC
  Administered 2019-07-23: 2 g via INTRAVENOUS
  Filled 2019-07-23: qty 2

## 2019-07-23 MED ORDER — SODIUM CHLORIDE 0.9 % IV SOLN
INTRAVENOUS | Status: DC | PRN
  Administered 2019-07-23: 25 ug/min via INTRAVENOUS

## 2019-07-23 MED ORDER — ACETAMINOPHEN 650 MG RE SUPP: 650.0000 mg | Freq: Four times a day (QID) | RECTAL | Status: DC | PRN

## 2019-07-23 MED ORDER — OXYCODONE HCL 5 MG PO TABS
5.0000 mg | ORAL_TABLET | ORAL | Status: DC | PRN
  Administered 2019-07-24: 10 mg via ORAL
  Filled 2019-07-23 (×4): qty 2

## 2019-07-23 MED ORDER — PROPOFOL 10 MG/ML IV BOLUS
INTRAVENOUS | Status: AC
  Filled 2019-07-23: qty 20

## 2019-07-23 MED ORDER — ACETAMINOPHEN 325 MG PO TABS
650.0000 mg | ORAL_TABLET | Freq: Four times a day (QID) | ORAL | Status: DC | PRN
  Filled 2019-07-23: qty 2

## 2019-07-23 MED ORDER — DOCUSATE SODIUM 100 MG PO CAPS
100.0000 mg | ORAL_CAPSULE | Freq: Two times a day (BID) | ORAL | Status: DC
  Administered 2019-07-24 – 2019-07-30 (×11): 100 mg via ORAL
  Filled 2019-07-23 (×13): qty 1

## 2019-07-23 MED ORDER — SODIUM CHLORIDE 0.9 % IR SOLN
Status: DC | PRN
  Administered 2019-07-23: 1000 mL

## 2019-07-23 MED ORDER — ONDANSETRON 4 MG PO TBDP: 4.0000 mg | ORAL_TABLET | Freq: Four times a day (QID) | ORAL | Status: DC | PRN

## 2019-07-23 MED ORDER — METOPROLOL TARTRATE 5 MG/5ML IV SOLN: 5.0000 mg | Freq: Four times a day (QID) | INTRAVENOUS | Status: DC | PRN

## 2019-07-23 MED ORDER — MORPHINE SULFATE (PF) 2 MG/ML IV SOLN
2.0000 mg | Freq: Once | INTRAVENOUS | Status: AC
  Administered 2019-07-23: 2 mg via INTRAVENOUS
  Filled 2019-07-23: qty 1

## 2019-07-23 MED ORDER — LACTATED RINGERS IV SOLN
INTRAVENOUS | Status: DC | PRN
  Administered 2019-07-23: 18:00:00 via INTRAVENOUS

## 2019-07-23 MED ORDER — MORPHINE SULFATE (PF) 2 MG/ML IV SOLN
2.0000 mg | INTRAVENOUS | Status: DC | PRN
  Filled 2019-07-23: qty 1

## 2019-07-23 MED ORDER — ONDANSETRON HCL 4 MG/2ML IJ SOLN: 4.0000 mg | Freq: Four times a day (QID) | INTRAMUSCULAR | Status: DC | PRN

## 2019-07-23 MED ORDER — LACTATED RINGERS IV SOLN
INTRAVENOUS | Status: DC | PRN
  Administered 2019-07-23: 17:00:00 via INTRAVENOUS

## 2019-07-23 MED ORDER — FENTANYL CITRATE (PF) 250 MCG/5ML IJ SOLN
INTRAMUSCULAR | Status: AC
  Filled 2019-07-23: qty 5

## 2019-07-23 MED ORDER — ROCURONIUM BROMIDE 10 MG/ML (PF) SYRINGE
PREFILLED_SYRINGE | INTRAVENOUS | Status: DC | PRN
  Administered 2019-07-23: 10 mg via INTRAVENOUS
  Administered 2019-07-23: 50 mg via INTRAVENOUS

## 2019-07-23 MED ORDER — TRAMADOL HCL 50 MG PO TABS
50.0000 mg | ORAL_TABLET | Freq: Four times a day (QID) | ORAL | Status: DC | PRN
  Administered 2019-07-24 – 2019-07-25 (×2): 50 mg via ORAL
  Filled 2019-07-23 (×4): qty 1

## 2019-07-23 MED ORDER — BUPIVACAINE HCL 0.25 % IJ SOLN
INTRAMUSCULAR | Status: DC | PRN
  Administered 2019-07-23: 13 mL

## 2019-07-23 MED ORDER — SUCCINYLCHOLINE CHLORIDE 200 MG/10ML IV SOSY
PREFILLED_SYRINGE | INTRAVENOUS | Status: DC | PRN
  Administered 2019-07-23: 100 mg via INTRAVENOUS

## 2019-07-23 MED ORDER — PROMETHAZINE HCL 25 MG/ML IJ SOLN
6.2500 mg | INTRAMUSCULAR | Status: DC | PRN
  Administered 2019-07-23: 6.25 mg via INTRAVENOUS

## 2019-07-23 MED ORDER — HYDROMORPHONE HCL 1 MG/ML IJ SOLN
INTRAMUSCULAR | Status: DC | PRN
  Administered 2019-07-23: 0.5 mg via INTRAVENOUS

## 2019-07-23 MED ORDER — ONDANSETRON HCL 4 MG/2ML IJ SOLN
INTRAMUSCULAR | Status: DC | PRN
  Administered 2019-07-23: 4 mg via INTRAVENOUS

## 2019-07-23 MED ORDER — POLYETHYLENE GLYCOL 3350 17 G PO PACK: 17.0000 g | PACK | Freq: Every day | ORAL | Status: DC | PRN

## 2019-07-23 MED ORDER — DIPHENHYDRAMINE HCL 50 MG/ML IJ SOLN: 12.5000 mg | Freq: Four times a day (QID) | INTRAMUSCULAR | Status: DC | PRN

## 2019-07-23 MED ORDER — DIPHENHYDRAMINE HCL 12.5 MG/5ML PO ELIX: 12.5000 mg | ORAL_SOLUTION | Freq: Four times a day (QID) | ORAL | Status: DC | PRN

## 2019-07-23 MED ORDER — LABETALOL HCL 5 MG/ML IV SOLN
5.0000 mg | INTRAVENOUS | Status: DC | PRN
  Administered 2019-07-23: 5 mg via INTRAVENOUS

## 2019-07-23 MED ORDER — MIDAZOLAM HCL 5 MG/5ML IJ SOLN
INTRAMUSCULAR | Status: DC | PRN
  Administered 2019-07-23: 2 mg via INTRAVENOUS

## 2019-07-23 MED ORDER — SODIUM CHLORIDE 0.45 % IV SOLN
INTRAVENOUS | Status: DC
  Administered 2019-07-23 – 2019-07-25 (×4): via INTRAVENOUS

## 2019-07-23 MED ORDER — ONDANSETRON HCL 4 MG/2ML IJ SOLN
4.0000 mg | Freq: Four times a day (QID) | INTRAMUSCULAR | Status: DC | PRN
  Administered 2019-07-24 – 2019-07-31 (×10): 4 mg via INTRAVENOUS
  Filled 2019-07-23 (×12): qty 2

## 2019-07-23 MED ORDER — GABAPENTIN 300 MG PO CAPS
300.0000 mg | ORAL_CAPSULE | Freq: Two times a day (BID) | ORAL | Status: DC
  Administered 2019-07-23 – 2019-07-30 (×12): 300 mg via ORAL
  Filled 2019-07-23 (×14): qty 1

## 2019-07-23 MED ORDER — ONDANSETRON HCL 4 MG/2ML IJ SOLN
INTRAMUSCULAR | Status: AC
  Filled 2019-07-23: qty 2

## 2019-07-23 MED ORDER — FENTANYL CITRATE (PF) 100 MCG/2ML IJ SOLN
25.0000 ug | INTRAMUSCULAR | Status: DC | PRN
  Administered 2019-07-23: 25 ug via INTRAVENOUS

## 2019-07-23 MED ORDER — IOPAMIDOL (ISOVUE-300) INJECTION 61%
100.0000 mL | Freq: Once | INTRAVENOUS | Status: AC | PRN
  Administered 2019-07-23: 100 mL via INTRAVENOUS

## 2019-07-23 MED ORDER — SIMETHICONE 80 MG PO CHEW: 40.0000 mg | CHEWABLE_TABLET | Freq: Four times a day (QID) | ORAL | Status: DC | PRN

## 2019-07-23 MED ORDER — LABETALOL HCL 5 MG/ML IV SOLN
INTRAVENOUS | Status: AC
  Filled 2019-07-23: qty 4

## 2019-07-23 MED ORDER — ENOXAPARIN SODIUM 40 MG/0.4ML ~~LOC~~ SOLN
40.0000 mg | SUBCUTANEOUS | Status: DC
  Administered 2019-07-24 – 2019-08-03 (×10): 40 mg via SUBCUTANEOUS
  Filled 2019-07-23 (×10): qty 0.4

## 2019-07-23 MED ORDER — SUGAMMADEX SODIUM 200 MG/2ML IV SOLN
INTRAVENOUS | Status: DC | PRN
  Administered 2019-07-23: 200 mg via INTRAVENOUS

## 2019-07-23 MED ORDER — HYDROMORPHONE HCL 1 MG/ML IJ SOLN
INTRAMUSCULAR | Status: AC
  Filled 2019-07-23: qty 0.5

## 2019-07-23 MED ORDER — 0.9 % SODIUM CHLORIDE (POUR BTL) OPTIME
TOPICAL | Status: DC | PRN
  Administered 2019-07-23: 1000 mL

## 2019-07-23 MED ORDER — BUPIVACAINE HCL (PF) 0.25 % IJ SOLN
INTRAMUSCULAR | Status: AC
  Filled 2019-07-23: qty 30

## 2019-07-23 MED ORDER — MIDAZOLAM HCL 2 MG/2ML IJ SOLN
INTRAMUSCULAR | Status: AC
  Filled 2019-07-23: qty 2

## 2019-07-23 MED ORDER — LACTATED RINGERS IV SOLN: INTRAVENOUS | Status: DC

## 2019-07-23 MED ORDER — SODIUM CHLORIDE 0.9% FLUSH
3.0000 mL | Freq: Once | INTRAVENOUS | Status: AC
  Administered 2019-07-23: 3 mL via INTRAVENOUS

## 2019-07-23 MED ORDER — LIDOCAINE 2% (20 MG/ML) 5 ML SYRINGE
INTRAMUSCULAR | Status: DC | PRN
  Administered 2019-07-23: 60 mg via INTRAVENOUS

## 2019-07-23 MED ORDER — DEXAMETHASONE SODIUM PHOSPHATE 10 MG/ML IJ SOLN
INTRAMUSCULAR | Status: DC | PRN
  Administered 2019-07-23: 10 mg via INTRAVENOUS

## 2019-07-23 MED ORDER — METHOCARBAMOL 500 MG PO TABS
500.0000 mg | ORAL_TABLET | Freq: Four times a day (QID) | ORAL | Status: DC | PRN
  Administered 2019-07-23 – 2019-07-24 (×2): 500 mg via ORAL
  Filled 2019-07-23 (×2): qty 1

## 2019-07-23 MED ORDER — PHENYLEPHRINE 40 MCG/ML (10ML) SYRINGE FOR IV PUSH (FOR BLOOD PRESSURE SUPPORT)
PREFILLED_SYRINGE | INTRAVENOUS | Status: AC
  Filled 2019-07-23: qty 10

## 2019-07-23 MED ORDER — FENTANYL CITRATE (PF) 100 MCG/2ML IJ SOLN
INTRAMUSCULAR | Status: AC
  Filled 2019-07-23: qty 2

## 2019-07-23 MED ORDER — KETOROLAC TROMETHAMINE 15 MG/ML IJ SOLN
15.0000 mg | Freq: Four times a day (QID) | INTRAMUSCULAR | Status: AC | PRN
  Administered 2019-07-24 – 2019-07-27 (×3): 15 mg via INTRAVENOUS
  Filled 2019-07-23 (×4): qty 1

## 2019-07-23 MED ORDER — PROMETHAZINE HCL 25 MG/ML IJ SOLN
INTRAMUSCULAR | Status: AC
  Filled 2019-07-23: qty 1

## 2019-07-23 SURGICAL SUPPLY — 47 items
ADH SKN CLS APL DERMABOND .7 (GAUZE/BANDAGES/DRESSINGS) ×1
APL PRP STRL LF DISP 70% ISPRP (MISCELLANEOUS) ×1
BLADE CLIPPER SURG (BLADE) IMPLANT
BLADE SURG 10 STRL SS (BLADE) ×2 IMPLANT
CANISTER SUCT 3000ML PPV (MISCELLANEOUS) IMPLANT
CHLORAPREP W/TINT 26 (MISCELLANEOUS) ×3 IMPLANT
COVER SURGICAL LIGHT HANDLE (MISCELLANEOUS) ×3 IMPLANT
COVER WAND RF STERILE (DRAPES) ×1 IMPLANT
DEFOGGER SCOPE WARMER CLEARIFY (MISCELLANEOUS) IMPLANT
DERMABOND ADVANCED (GAUZE/BANDAGES/DRESSINGS) ×2
DERMABOND ADVANCED .7 DNX12 (GAUZE/BANDAGES/DRESSINGS) ×1 IMPLANT
DEVICE SECURE STRAP 25 ABSORB (INSTRUMENTS) ×4 IMPLANT
DEVICE TROCAR PUNCTURE CLOSURE (ENDOMECHANICALS) ×2 IMPLANT
DRAPE WARM FLUID 44X44 (DRAPES) ×1 IMPLANT
ELECT REM PT RETURN 9FT ADLT (ELECTROSURGICAL) ×3
ELECTRODE REM PT RTRN 9FT ADLT (ELECTROSURGICAL) ×1 IMPLANT
GLOVE BIO SURGEON STRL SZ 6.5 (GLOVE) ×1 IMPLANT
GLOVE BIO SURGEON STRL SZ7.5 (GLOVE) ×3 IMPLANT
GLOVE BIO SURGEONS STRL SZ 6.5 (GLOVE) ×1
GLOVE INDICATOR 6.5 STRL GRN (GLOVE) ×2 IMPLANT
GLOVE SURG SS PI 6.5 STRL IVOR (GLOVE) ×2 IMPLANT
GOWN STRL REUS W/ TWL LRG LVL3 (GOWN DISPOSABLE) ×2 IMPLANT
GOWN STRL REUS W/ TWL XL LVL3 (GOWN DISPOSABLE) ×1 IMPLANT
GOWN STRL REUS W/TWL LRG LVL3 (GOWN DISPOSABLE) ×6
GOWN STRL REUS W/TWL XL LVL3 (GOWN DISPOSABLE) ×3
KIT BASIN OR (CUSTOM PROCEDURE TRAY) ×3 IMPLANT
KIT TURNOVER KIT B (KITS) ×3 IMPLANT
MESH VENTRALIGHT ST 8IN CRC (Mesh General) ×2 IMPLANT
NDL INSUFFLATION 14GA 120MM (NEEDLE) ×1 IMPLANT
NEEDLE INSUFFLATION 14GA 120MM (NEEDLE) ×3 IMPLANT
NS IRRIG 1000ML POUR BTL (IV SOLUTION) ×3 IMPLANT
PAD ARMBOARD 7.5X6 YLW CONV (MISCELLANEOUS) ×6 IMPLANT
PENCIL BUTTON HOLSTER BLD 10FT (ELECTRODE) ×2 IMPLANT
SCISSORS LAP 5X35 DISP (ENDOMECHANICALS) ×2 IMPLANT
SET IRRIG TUBING LAPAROSCOPIC (IRRIGATION / IRRIGATOR) ×2 IMPLANT
SET TUBE SMOKE EVAC HIGH FLOW (TUBING) ×3 IMPLANT
SLEEVE ENDOPATH XCEL 5M (ENDOMECHANICALS) ×3 IMPLANT
SUT CHROMIC 2 0 SH (SUTURE) ×2 IMPLANT
SUT MNCRL AB 4-0 PS2 18 (SUTURE) ×3 IMPLANT
SUT NOVA NAB GS-21 0 18 T12 DT (SUTURE) ×4 IMPLANT
TOWEL GREEN STERILE (TOWEL DISPOSABLE) ×3 IMPLANT
TOWEL GREEN STERILE FF (TOWEL DISPOSABLE) ×3 IMPLANT
TRAY LAPAROSCOPIC MC (CUSTOM PROCEDURE TRAY) ×3 IMPLANT
TROCAR XCEL 12X100 BLDLESS (ENDOMECHANICALS) IMPLANT
TROCAR XCEL BLUNT TIP 100MML (ENDOMECHANICALS) IMPLANT
TROCAR XCEL NON-BLD 11X100MML (ENDOMECHANICALS) IMPLANT
TROCAR XCEL NON-BLD 5MMX100MML (ENDOMECHANICALS) ×3 IMPLANT

## 2019-07-23 NOTE — Progress Notes (Signed)
Called Anesthesia MD.  Notified him one dose of 5mg  labetalol was given IV and pts BP initially responded at 149/71 (88), although her BP dropped to 58-61 as well.  BP has since rebounded to 171/94 and MD is ok with pt continuing on to the floor.  Will continue to monitor.

## 2019-07-23 NOTE — Progress Notes (Signed)
Report attempted to the RN taking 6N06 RN requesting bedside report in 10 minutes.  Will continue to monitor and notify for further changes.

## 2019-07-23 NOTE — Anesthesia Preprocedure Evaluation (Addendum)
Anesthesia Evaluation  Patient identified by MRN, date of birth, ID band Patient awake    Reviewed: Allergy & Precautions, NPO status , Patient's Chart, lab work & pertinent test results  Airway Mallampati: II  TM Distance: >3 FB     Dental  (+) Dental Advisory Given   Pulmonary neg pulmonary ROS,    breath sounds clear to auscultation       Cardiovascular hypertension, Pt. on medications  Rhythm:Regular Rate:Normal     Neuro/Psych negative neurological ROS     GI/Hepatic Neg liver ROS, Incarcerated hernia     Endo/Other  Morbid obesity  Renal/GU Renal InsufficiencyRenal disease     Musculoskeletal   Abdominal   Peds  Hematology negative hematology ROS (+)   Anesthesia Other Findings   Reproductive/Obstetrics                            Lab Results  Component Value Date   WBC 7.0 07/23/2019   HGB 14.2 07/23/2019   HCT 44.9 07/23/2019   MCV 81.6 07/23/2019   PLT 236 07/23/2019   Lab Results  Component Value Date   CREATININE 1.08 (H) 07/23/2019   BUN 11 07/23/2019   NA 140 07/23/2019   K 4.0 07/23/2019   CL 107 07/23/2019   CO2 25 07/23/2019    Anesthesia Physical Anesthesia Plan  ASA: III and emergent  Anesthesia Plan: General   Post-op Pain Management:    Induction: Intravenous and Rapid sequence  PONV Risk Score and Plan: 3 and Dexamethasone, Ondansetron, Treatment may vary due to age or medical condition and Midazolam  Airway Management Planned: Oral ETT  Additional Equipment:   Intra-op Plan:   Post-operative Plan: Extubation in OR  Informed Consent: I have reviewed the patients History and Physical, chart, labs and discussed the procedure including the risks, benefits and alternatives for the proposed anesthesia with the patient or authorized representative who has indicated his/her understanding and acceptance.     Dental advisory given  Plan  Discussed with: CRNA  Anesthesia Plan Comments:        Anesthesia Quick Evaluation

## 2019-07-23 NOTE — H&P (Signed)
[facility] Admission Note  Rhonda Payne July 11, 1956  [id number].    Requesting MD: Gareth Morgan Chief Complaint/Reason for Consult: hernia  HPI:  Rhonda Payne is a 63yo female PMH HTN and obesity who presented to [facility] earlier today complaining of abdominal pain. States that the pain started yesterday morning. It is diffuse but most severe periumbilical and left lower quadrant. Worse with walking and palpation. Somewhat better with rest. Initially thought she was constipated and took some milk of magnesia. She had a BM yesterday but this did not improve her pain. Denies nausea, vomiting, fever, chills, dysuria, or back pain. Passing some flatus this morning. She has had nothing to eat/drink today. States that she does not have an appetite. 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 WBC 7.0. Creatinine 1.08.  Abdominal surgical history: laparoscopic cholecystectomy, vaginal hysterectomy Anticoagulants: none Nonsmoker Denies alcohol or drug use Not currently employed  ROS: Review of Systems  Constitutional: Negative.  Negative for chills and fever.  HENT: Negative.   Eyes: Negative.   Respiratory: Negative.   Cardiovascular: Negative.   Gastrointestinal: Positive for abdominal pain and constipation. Negative for diarrhea, nausea and vomiting.  Genitourinary: Negative.   Musculoskeletal: Negative.   Skin: Negative.   Neurological: Negative.    All systems reviewed and otherwise negative except for as above  Family History  Problem Relation Age of Onset  . Diabetes Mother   . Cancer Father        no sure but thinks it's prostate    Past Medical History:  Diagnosis Date  . Anemia   . History of blood transfusion    "when I was a child; related to sickle cell trait"  . Hypertension    . Sickle cell trait [facility])     Past Surgical History:  Procedure Laterality Date  . CHOLECYSTECTOMY N/A 03/20/2015   Procedure: LAPAROSCOPIC CHOLECYSTECTOMY WITH INTRAOPERATIVE CHOLANGIOGRAM;  Surgeon: Alphonsa Overall, MD;  Location: [place];  Service: General;  Laterality: N/A;  . DILATION AND CURETTAGE OF UTERUS  1990's  . LAPAROSCOPIC CHOLECYSTECTOMY  03/20/2015   w/IOC  . VAGINAL HYSTERECTOMY  1990's    Social History:  reports that she has never smoked. She has never used smokeless tobacco. She reports that she does not drink alcohol or use drugs.  Allergies:  Allergies  Allergen Reactions  . Aspirin Rash and Other (See Comments)    Caused pain and rash    (Not in a hospital admission)   Prior to Admission medications   Medication Sig Start Date End Date Taking? Authorizing Provider  Cholecalciferol (VITAMIN D3 PO) Take 1 tablet by mouth daily.    Yes [provider]  lisinopril-hydrochlorothiazide (ZESTORETIC) 20-25 MG tablet Take 1 tablet by mouth daily. 07/19/19  Yes Fulp, Cammie, MD    Blood pressure (!) 157/91, pulse 67, temperature 98.3 F (36.8 C), temperature source Oral, resp. rate 15, SpO2 97 %. Physical Exam: General: pleasant, WD/WN AA female who is laying in bed in NAD HEENT: head is normocephalic, atraumatic.  Sclera are noninjected.  Pupils equal and round.  Ears and nose without any masses or lesions.  Mouth is pink and moist. Dentition fair Heart: regular, rate, and rhythm.  No obvious murmurs, gallops, or rubs noted.  Palpable pedal pulses bilaterally Lungs: CTAB, no wheezes, rhonchi, or rales noted.  Respiratory effort  nonlabored Abd: obese, soft, nondistended, +BS, mild generalized abdominal tenderness with more severe tenderness LLQ just distal to umbilicus where a firm mass is palpable which correlates to her hernia/ this is not reducible/ no overlying skin changes MS: no BLE edema Skin: warm and dry with no masses, lesions, or rashes Psych: A&Ox3  with an appropriate affect. Neuro: cranial nerves grossly intact, extremity CSM intact bilaterally, normal speech  Results for orders placed or performed during the hospital encounter of 07/23/19 (from the past 48 hour(s))  Lipase, blood     Status: None   Collection Time: 07/23/19 11:09 AM  Result Value Ref Range   Lipase 23 11 - 51 U/L    Comment: Performed at [facility], [address] [place], [place] [address]  Comprehensive metabolic panel     Status: Abnormal   Collection Time: 07/23/19 11:09 AM  Result Value Ref Range   Sodium 140 135 - 145 mmol/L   Potassium 4.0 3.5 - 5.1 mmol/L   Chloride 107 98 - 111 mmol/L   CO2 25 22 - 32 mmol/L   Glucose, Bld 107 (H) 70 - 99 mg/dL   BUN 11 8 - 23 mg/dL   Creatinine, Ser 6.041.08 (H) 0.44 - 1.00 mg/dL   Calcium 9.2 8.9 - 54.010.3 mg/dL   Total Protein 7.8 6.5 - 8.1 g/dL   Albumin 3.8 3.5 - 5.0 g/dL   AST 21 15 - 41 U/L   ALT 13 0 - 44 U/L   Alkaline Phosphatase 47 38 - 126 U/L   Total Bilirubin 1.0 0.3 - 1.2 mg/dL   GFR calc non Af Amer 55 (L) >60 mL/min   GFR calc Af Amer >60 >60 mL/min   Anion gap 8 5 - 15    Comment: Performed at [facility], [address] [place], [place] [address]  CBC     Status: Abnormal   Collection Time: 07/23/19 11:09 AM  Result Value Ref Range   WBC 7.0 4.0 - 10.5 K/uL   RBC 5.50 (H) 3.87 - 5.11 MIL/uL   Hemoglobin 14.2 12.0 - 15.0 g/dL   HCT 14.744.9 82.936.0 - 56.246.0 %   MCV 81.6 80.0 - 100.0 fL   MCH 25.8 (L) 26.0 - 34.0 pg   MCHC 31.6 30.0 - 36.0 g/dL   RDW 13.014.0 86.511.5 - 78.415.5 %   Platelets 236 150 - 400 K/uL   nRBC 0.0 0.0 - 0.2 %    Comment: Performed at [facility], [address] [place], [place] [address]  Urinalysis, Routine w reflex microscopic     Status: Abnormal   Collection Time: 07/23/19  1:30 PM  Result Value Ref Range   Color, Urine YELLOW YELLOW   APPearance CLEAR CLEAR   Specific Gravity, Urine >1.046 (H) 1.005 - 1.030   pH 7.0 5.0 - 8.0   Glucose, UA 50 (A) NEGATIVE  mg/dL   Hgb urine dipstick SMALL (A) NEGATIVE   Bilirubin Urine NEGATIVE NEGATIVE   Ketones, ur 5 (A) NEGATIVE mg/dL   Protein, ur NEGATIVE NEGATIVE mg/dL   Nitrite NEGATIVE NEGATIVE   Leukocytes,Ua NEGATIVE NEGATIVE   RBC / HPF 0-5 0 - 5 RBC/hpf   WBC, UA 0-5 0 - 5 WBC/hpf   Bacteria, UA NONE SEEN NONE SEEN   Squamous Epithelial / LPF 6-10 0 - 5    Comment: Performed at [facility], [address] [place], [place] [address]   Ct Abdomen Pelvis W  Contrast  Result Date: 07/23/2019 CLINICAL DATA:  Abdominal pain EXAM: CT ABDOMEN AND PELVIS WITH CONTRAST TECHNIQUE: Multidetector CT imaging of the abdomen and pelvis was performed using the standard protocol following bolus administration of intravenous contrast. CONTRAST:  100mL ISOVUE-300 IOPAMIDOL (ISOVUE-300) INJECTION 61% COMPARISON:  None. FINDINGS: Lower chest: Lung bases are clear. Hepatobiliary: There are multiple cysts throughout the liver, largest measuring approximately 3 x 2.5 cm. No noncystic liver lesions are evident. 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 The gallbladder is absent. There is no biliary duct dilatation. Pancreas: There is no pancreatic mass or inflammatory focus. Spleen: There is an apparent hemangioma in the medial spleen measuring 1.4 x 1.0 cm. No other splenic lesions are evident. Adrenals/Urinary Tract: Adrenals bilaterally appear normal. There are cysts in each kidney. Largest cyst arises from the lateral right kidney measuring 3.2 x 3.0 cm. There is no hydronephrosis on either side. There is no evident renal or ureteral calculus on either side. Urinary bladder is midline with wall thickness within normal limits. Stomach/Bowel: There is no appreciable diverticulitis. There is a left-sided spigelian type hernia slightly to the left of midline which contains several loops of bowel. The bowel loops within this hernia show mild thickening with adjacent fluid. There is, however, no appreciable  bowel obstruction. Elsewhere, there is no appreciable bowel wall thickening. The terminal ileum appears unremarkable. No free air or portal venous air evident. Vascular/Lymphatic: No abdominal aortic aneurysm. There is slight aortic and iliac artery atherosclerosis. No adenopathy is evident in the abdomen or pelvis. Reproductive: Uterus is absent. No pelvic mass. There is fluid in the cul-de-sac region. Other: There is no periappendiceal region inflammation. Appendix appears normal. There is no abscess in the abdomen or pelvis. As noted above, there is a left spigelian hernia slightly to the left of midline which contains loops of bowel with mildly thickened walls and moderate fluid. There is no frank bowel obstruction in this area. There is in addition a midline ventral hernia which contains fat but no bowel. Note that at the level of the spigelian hernia, the neck of the hernia from left to right measures 2.6 cm. The neck from superior to inferior dimension measures 3.0 cm. Musculoskeletal: No blastic or lytic bone lesions evident. No intramuscular lesions are evident. IMPRESSION: 1. Left anterior spigelian type hernia. Loops of small bowel extend into this hernia with localized wall thickening of these loops. There is also moderate fluid in this area. There is felt to be localized inflammation involving the bowel in this area, but there is no evident bowel obstruction. The appearance does raise concern for potential early bowel compromise involving the loops within this spigelian hernia. Surgical consultation in this regard may be advisable. No intramural bowel air.  No perforation in this area. 2. Elsewhere no bowel obstruction. No abscess in the abdomen or pelvis. Appendix appears normal. 3. Fluid in the cul-de-sac potentially could indicate recent ovarian cyst rupture or have inflammatory etiology, possibly as a result of the spigelian hernia. 4. No renal or ureteral calculus. No hydronephrosis. Urinary  bladder wall thickness normal. 5.  Several renal and hepatic cysts. 6.  Small benign splenic hemangioma. 7.  Gallbladder and uterus absent. Electronically Signed   By: Bretta BangWilliam  Woodruff III M.D.   On: 07/23/2019 13:37      Assessment/Plan HTN Morbid obesity AKI - Cr 1.08. some possible CKD as she has a documented elevated creatinine 4 and 6 years ago. Continue IVF  Incarcerated  left anterior spigelian type hernia - Patient with an incarcerated left anterior spigelian type hernia. WBC is WNL and VSS but she is very tender on exam. Will plan for laparoscopic versus open hernia repair possibly with mesh tonight. Keep NPO. Covid pending.   ID - cefotetan on call to OR VTE - SCDs, lovenox FEN - IVF, NPO Foley - none Follow up - none  Franne FortsBrooke A , [facility] LPA-C [facility] 07/23/2019, 2:38 PM Pager: [phone] Mon-Thurs 7:00 am-4:30 pm Fri 7:00 am -11:30 AM Sat-Sun 7:00 am-11:30 am

## 2019-07-23 NOTE — ED Provider Notes (Signed)
[facility] EMERGENCY [facility] Provider Note   CSN: [id number] Arrival date & time: 07/23/19  1038    History   Chief Complaint Chief Complaint  Patient presents with  . Abdominal Pain    HPI Rhonda Payne is a 63 y.o. female a past medical history of hypertension, status post cholecystectomy and hysterectomy, presenting to the emergency department with 2 days of generalized abdominal pain that began yesterday.  She states initially she felt constipated and treated with milk of magnesia.  She states this morning she had a loose bowel movement, however she continues to have the aching pain in her abdomen.  She states it feels worse in her left lower quadrant.  When she had her gallbladder removed she was told by the surgeon that she had a hernia somewhere in her left lower quadrant.  She states sometimes it feels soft and sometimes it feels more firm.  She states she was told to be seen if symptoms changed.  She denies associated nausea, vomiting, urinary symptoms, fevers, chills.  No known history of diverticulitis.     The history is provided by the patient.    Past Medical History:  Diagnosis Date  . Anemia   . History of blood transfusion    "when I was a child; related to sickle cell trait"  . Hypertension   . Sickle cell trait [facility](HCC)     Patient Active Problem List   Diagnosis Date Noted  . Incarcerated hernia 07/23/2019  . Cholelithiasis with acute cholecystitis 03/20/2015  . Chronic renal insufficiency 03/20/2015  . Essential hypertension 03/20/2015  . Hypokalemia 03/20/2015    Past Surgical History:  Procedure Laterality Date  . CHOLECYSTECTOMY N/A 03/20/2015   Procedure: LAPAROSCOPIC CHOLECYSTECTOMY WITH INTRAOPERATIVE CHOLANGIOGRAM;  Surgeon: Ovidio Kinavid Newman, MD;  Location: [facility];  Service: General;  Laterality: N/A;  . DILATION AND CURETTAGE OF UTERUS  1990's  . LAPAROSCOPIC CHOLECYSTECTOMY  03/20/2015   w/IOC  . VAGINAL HYSTERECTOMY  1990's     OB History   No obstetric history on file.      Home Medications    Prior to Admission medications   Medication Sig Start Date End Date Taking? Authorizing Provider  Cholecalciferol (VITAMIN D3 PO) Take 1 tablet by mouth daily.    Yes [provider]  lisinopril-hydrochlorothiazide (ZESTORETIC) 20-25 MG tablet Take 1 tablet by mouth daily. 07/19/19  Yes Fulp, Hewitt Shortsammie, MD    Family History Family History  Problem Relation Age of Onset  . Diabetes Mother   . Cancer Father        no sure but thinks it's prostate    Social History Social History   Tobacco Use  . Smoking status: Never Smoker  . Smokeless tobacco: Never Used  Substance Use Topics  . Alcohol use: No  . Drug use: No     Allergies   Aspirin   Review of Systems Review of Systems  Gastrointestinal: Positive for abdominal pain and constipation.  All other systems reviewed and are negative.    Physical Exam Updated Vital Signs BP (!) 163/94   Pulse 64   Temp 98.3 F (36.8 C) (Oral)   Resp 14   SpO2 98%   Physical Exam Vitals signs and nursing note reviewed.  Constitutional:      General: She is not in acute distress.    Appearance: She is well-developed. She is obese. She is not ill-appearing.  HENT:     Head: Normocephalic and atraumatic.  Eyes:  Conjunctiva/sclera: Conjunctivae normal.  Cardiovascular:     Rate and Rhythm: Normal rate and regular rhythm.  Pulmonary:     Effort: Pulmonary effort is normal. No respiratory distress.     Breath sounds: Normal breath sounds.  Abdominal:     General: Bowel sounds are normal.     Palpations: Abdomen is soft.     Tenderness: There is generalized abdominal tenderness. There is no guarding or rebound.     Hernia: A hernia (firm tender mass palpated surrounding left lower portion of umbilicus and left of midline. Unable to reduce, very tender) is present.    Skin:    General: Skin is warm.  Neurological:     Mental Status: She is  alert.  Psychiatric:        Behavior: Behavior normal.      ED Treatments / Results  Labs (all labs ordered are listed, but only abnormal results are displayed) Labs Reviewed  COMPREHENSIVE METABOLIC PANEL - Abnormal; Notable for the following components:      Result Value   Glucose, Bld 107 (*)    Creatinine, Ser 1.08 (*)    GFR calc non Af Amer 55 (*)    All other components within normal limits  CBC - Abnormal; Notable for the following components:   RBC 5.50 (*)    MCH 25.8 (*)    All other components within normal limits  URINALYSIS, ROUTINE W REFLEX MICROSCOPIC - Abnormal; Notable for the following components:   Specific Gravity, Urine >1.046 (*)    Glucose, UA 50 (*)    Hgb urine dipstick SMALL (*)    Ketones, ur 5 (*)    All other components within normal limits  SARS CORONAVIRUS 2 (HOSPITAL ORDER, [place] [facility])  LIPASE, BLOOD  HIV ANTIBODY (ROUTINE TESTING W REFLEX)  TYPE AND SCREEN    EKG None  Radiology Ct Abdomen Pelvis W Contrast  Result Date: 07/23/2019 CLINICAL DATA:  Abdominal pain EXAM: CT ABDOMEN AND PELVIS WITH CONTRAST TECHNIQUE: Multidetector CT imaging of the abdomen and pelvis was performed using the standard protocol following bolus administration of intravenous contrast. CONTRAST:  144mL ISOVUE-300 IOPAMIDOL (ISOVUE-300) INJECTION 61% COMPARISON:  None. FINDINGS: Lower chest: Lung bases are clear. Hepatobiliary: There are multiple cysts throughout the liver, largest measuring approximately 3 x 2.5 cm. No noncystic liver lesions are evident. 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 The gallbladder is absent. There is no biliary duct dilatation. Pancreas: There is no pancreatic mass or inflammatory focus. Spleen: There is an apparent hemangioma in the medial spleen measuring 1.4 x 1.0 cm. No other splenic lesions are evident. Adrenals/Urinary Tract: Adrenals bilaterally appear normal. There are cysts in each  kidney. Largest cyst arises from the lateral right kidney measuring 3.2 x 3.0 cm. There is no hydronephrosis on either side. There is no evident renal or ureteral calculus on either side. Urinary bladder is midline with wall thickness within normal limits. Stomach/Bowel: There is no appreciable diverticulitis. There is a left-sided spigelian type hernia slightly to the left of midline which contains several loops of bowel. The bowel loops within this hernia show mild thickening with adjacent fluid. There is, however, no appreciable bowel obstruction. Elsewhere, there is no appreciable bowel wall thickening. The terminal ileum appears unremarkable. No free air or portal venous air evident. Vascular/Lymphatic: No abdominal aortic aneurysm. There is slight aortic and iliac artery atherosclerosis. No adenopathy is evident in the abdomen or pelvis. Reproductive: Uterus  is absent. No pelvic mass. There is fluid in the cul-de-sac region. Other: There is no periappendiceal region inflammation. Appendix appears normal. There is no abscess in the abdomen or pelvis. As noted above, there is a left spigelian hernia slightly to the left of midline which contains loops of bowel with mildly thickened walls and moderate fluid. There is no frank bowel obstruction in this area. There is in addition a midline ventral hernia which contains fat but no bowel. Note that at the level of the spigelian hernia, the neck of the hernia from left to right measures 2.6 cm. The neck from superior to inferior dimension measures 3.0 cm. Musculoskeletal: No blastic or lytic bone lesions evident. No intramuscular lesions are evident. IMPRESSION: 1. Left anterior spigelian type hernia. Loops of small bowel extend into this hernia with localized wall thickening of these loops. There is also moderate fluid in this area. There is felt to be localized inflammation involving the bowel in this area, but there is no evident bowel obstruction. The appearance  does raise concern for potential early bowel compromise involving the loops within this spigelian hernia. Surgical consultation in this regard may be advisable. No intramural bowel air.  No perforation in this area. 2. Elsewhere no bowel obstruction. No abscess in the abdomen or pelvis. Appendix appears normal. 3. Fluid in the cul-de-sac potentially could indicate recent ovarian cyst rupture or have inflammatory etiology, possibly as a result of the spigelian hernia. 4. No renal or ureteral calculus. No hydronephrosis. Urinary bladder wall thickness normal. 5.  Several renal and hepatic cysts. 6.  Small benign splenic hemangioma. 7.  Gallbladder and uterus absent. Electronically Signed   By: Bretta BangWilliam  Woodruff III M.D.   On: 07/23/2019 13:37    Procedures Procedures (including critical care time)  Medications Ordered in ED Medications  enoxaparin (LOVENOX) injection 40 mg (has no administration in time range)  0.45 % sodium chloride infusion ( Intravenous Hold 07/23/19 1503)  metoprolol tartrate (LOPRESSOR) injection 5 mg (has no administration in time range)  pantoprazole (PROTONIX) injection 40 mg (has no administration in time range)  simethicone (MYLICON) chewable tablet 40 mg (has no administration in time range)  ondansetron (ZOFRAN-ODT) disintegrating tablet 4 mg (has no administration in time range)    Or  ondansetron (ZOFRAN) injection 4 mg (has no administration in time range)  polyethylene glycol (MIRALAX / GLYCOLAX) packet 17 g (has no administration in time range)  docusate sodium (COLACE) capsule 100 mg (has no administration in time range)  diphenhydrAMINE (BENADRYL) 12.5 MG/5ML elixir 12.5 mg (has no administration in time range)    Or  diphenhydrAMINE (BENADRYL) injection 12.5 mg (has no administration in time range)  methocarbamol (ROBAXIN) tablet 500 mg (has no administration in time range)  morphine 2 MG/ML injection 2-4 mg (has no administration in time range)  oxyCODONE  (Oxy IR/ROXICODONE) immediate release tablet 5-10 mg (has no administration in time range)  acetaminophen (TYLENOL) tablet 650 mg (has no administration in time range)    Or  acetaminophen (TYLENOL) suppository 650 mg (has no administration in time range)  cefoTEtan (CEFOTAN) 2 g in sodium chloride 0.9 % 100 mL IVPB (has no administration in time range)  sodium chloride flush (NS) 0.9 % injection 3 mL (3 mLs Intravenous Given 07/23/19 1149)  morphine 2 MG/ML injection 2 mg (2 mg Intravenous Given 07/23/19 1149)  iopamidol (ISOVUE-300) 61 % injection 100 mL (100 mLs Intravenous Contrast Given 07/23/19 1309)     Initial Impression / Assessment  and Plan / ED Course  I have reviewed the triage vital signs and the nursing notes.  Pertinent labs & imaging results that were available during my care of the patient were reviewed by me and considered in my medical decision making (see chart for details).  Clinical Course as of Jul 22 1548  Tue Jul 23, 2019  1408 Brooke with surgery to evaluate patient   [JR]  1440 Pt to be taken to OR for hernia repair to prevent obstruction. COVID sent. Pt agreeable to plan. Appreciate consult.   [JR]    Clinical Course User Index [JR] , [place] N, PA-C       Pt w hx abdominal hernia, presenting with abdominal pain since yesterday. Pt with palpable mass, suspected to be hernia to the left of the midline near the umbilicus.  It is firm and unable to be reduced.  Generalized tenderness is also present on exam.  Hemodynamically stable.  Labs and imaging obtained which reveal inflammation of the bowels with bowel loops inside of spigelian hernia, not obviously incarcerated.  Consulted surgery who will bring patient to the OR for repair to prevent worsening.  Appreciate consult.  Patient agreeable to plan.  The patient appears reasonably stabilized for admission considering the current resources, flow, and capabilities available in the ED at this time, and I  doubt [facility] other [facility] further screening and/or treatment in the ED prior to admission.   Final Clinical Impressions(s) / ED Diagnoses   Final diagnoses:  Spigelian hernia    ED Discharge Orders    None       , [place] N, PA-C 07/23/19 1550    Alvira MondaySchlossman, Erin, MD 07/25/19 1552

## 2019-07-23 NOTE — Anesthesia Procedure Notes (Addendum)
Procedure Name: Intubation Date/Time: 07/23/2019 5:39 PM Performed by: Jearld Pies, CRNA Pre-anesthesia Checklist: Patient identified, Emergency Drugs available, Suction available and Patient being monitored Patient Re-evaluated:Patient Re-evaluated prior to induction Oxygen Delivery Method: Circle System Utilized Preoxygenation: Pre-oxygenation with 100% oxygen Induction Type: IV induction and Rapid sequence Laryngoscope Size: Mac and 3 Grade View: Grade I Tube type: Oral Tube size: 7.0 mm Number of attempts: 1 Airway Equipment and Method: Stylet and Oral airway Placement Confirmation: ETT inserted through vocal cords under direct vision,  positive ETCO2 and breath sounds checked- equal and bilateral Secured at: 22 cm Tube secured with: Tape Dental Injury: Teeth and Oropharynx as per pre-operative assessment

## 2019-07-23 NOTE — Progress Notes (Signed)
Notified MD was ready to take pt to the floor but blood pressure remains a little elevated.  166/101 (120).  MD ordered labetalol.  Will give and continue to monitor.

## 2019-07-23 NOTE — ED Notes (Signed)
Pt made aware urine is needed. Pt unable to go at this time

## 2019-07-23 NOTE — ED Notes (Signed)
ED TO INPATIENT HANDOFF REPORT  ED Nurse Name and Phone #: Magnus IvanLouie, RN  S Name/Age/Gender Rhonda Payne 63 y.o. female Room/Bed: 045C/045C  Code Status   Code Status: Full Code  Home/SNF/Other Home Patient oriented to: situation Is this baseline? Yes   Triage Complete: Triage complete  Chief Complaint abdominal pain  Triage Note Pt has pain in mid and and in LLQ where she has a known hernia. Pt thought yesterday this was due to constipation so had taken a stool soften. Pt did have a BM but did not relieve the pain. Denies any n/v/d.    Allergies Allergies  Allergen Reactions  . Aspirin Rash and Other (See Comments)    Caused pain and rash    Level of Care/Admitting Diagnosis ED Disposition    ED Disposition Condition Comment   Admit  [facility]ospital Area: [facility] [[id number]]  Level of Care: Med-Surg [16]  Covid Evaluation: N/A  Diagnosis: Incarcerated hernia [161096][[id number]]  Admitting Physician: CCS, MD [[id number]]  Attending Physician: CCS, MD [[id number]]  Bed request comments: 6N  PT Class (Do Not Modify): Observation [104]  PT Acc Code (Do Not Modify): Observation [10022]       B Medical/Surgery History Past Medical History:  Diagnosis Date  . Anemia   . History of blood transfusion    "when I was a child; related to sickle cell trait"  . Hypertension   . Sickle cell trait [facility](HCC)    Past Surgical History:  Procedure Laterality Date  . CHOLECYSTECTOMY N/A 03/20/2015   Procedure: LAPAROSCOPIC CHOLECYSTECTOMY WITH INTRAOPERATIVE CHOLANGIOGRAM;  Surgeon: Ovidio Kinavid Newman, MD;  Location: [facility] OR;  Service: General;  Laterality: N/A;  . DILATION AND CURETTAGE OF UTERUS  1990's  . LAPAROSCOPIC CHOLECYSTECTOMY  03/20/2015   w/IOC  . VAGINAL HYSTERECTOMY  1990's     A IV Location/Drains/Wounds Patient Lines/Drains/Airways Status   Active Line/Drains/Airways    Name:   Placement date:   Placement time:   Site:   Days:   Peripheral IV 07/23/19 Left Antecubital    07/23/19    1126    Antecubital   less than 1   Incision (Closed) 03/20/15 Abdomen Other (Comment)   03/20/15    1317     1586   Incision - 4 Ports Abdomen 1: Umbilicus 2: Mid;Upper 3: Right;Lateral 4: Right;Lower   03/20/15    1220     1586          Intake/Output Last 24 hours No intake or output data in the 24 hours ending 07/23/19 1509  Labs/Imaging Results for orders placed or performed during the hospital encounter of 07/23/19 (from the past 48 hour(s))  Lipase, blood     Status: None   Collection Time: 07/23/19 11:09 AM  Result Value Ref Range   Lipase 23 11 - 51 U/L    Comment: Performed at [facility], [address] [place], [place] [address]  Comprehensive metabolic panel     Status: Abnormal   Collection Time: 07/23/19 11:09 AM  Result Value Ref Range   Sodium 140 135 - 145 mmol/L   Potassium 4.0 3.5 - 5.1 mmol/L   Chloride 107 98 - 111 mmol/L   CO2 25 22 - 32 mmol/L   Glucose, Bld 107 (H) 70 - 99 mg/dL   BUN 11 8 - 23 mg/dL   Creatinine, Ser 9.811.08 (H) 0.44 - 1.00 mg/dL   Calcium 9.2 8.9 - 19.110.3 mg/dL   Total Protein 7.8 6.5 -  8.1 g/dL   Albumin 3.8 3.5 - 5.0 g/dL   AST 21 15 - 41 U/L   ALT 13 0 - 44 U/L   Alkaline Phosphatase 47 38 - 126 U/L   Total Bilirubin 1.0 0.3 - 1.2 mg/dL   GFR calc non Af Amer 55 (L) >60 mL/min   GFR calc Af Amer >60 >60 mL/min   Anion gap 8 5 - 15    Comment: Performed at [facility], [address] [place], [place] [address]  CBC     Status: Abnormal   Collection Time: 07/23/19 11:09 AM  Result Value Ref Range   WBC 7.0 4.0 - 10.5 K/uL   RBC 5.50 (H) 3.87 - 5.11 MIL/uL   Hemoglobin 14.2 12.0 - 15.0 g/dL   HCT 19.144.9 47.836.0 - 29.546.0 %   MCV 81.6 80.0 - 100.0 fL   MCH 25.8 (L) 26.0 - 34.0 pg   MCHC 31.6 30.0 - 36.0 g/dL   RDW 62.114.0 30.811.5 - 65.715.5 %   Platelets 236 150 - 400 K/uL   nRBC 0.0 0.0 - 0.2 %    Comment: Performed at [facility], [address] [place], [place] [address]  Urinalysis, Routine w reflex  microscopic     Status: Abnormal   Collection Time: 07/23/19  1:30 PM  Result Value Ref Range   Color, Urine YELLOW YELLOW   APPearance CLEAR CLEAR   Specific Gravity, Urine >1.046 (H) 1.005 - 1.030   pH 7.0 5.0 - 8.0   Glucose, UA 50 (A) NEGATIVE mg/dL   Hgb urine dipstick SMALL (A) NEGATIVE   Bilirubin Urine NEGATIVE NEGATIVE   Ketones, ur 5 (A) NEGATIVE mg/dL   Protein, ur NEGATIVE NEGATIVE mg/dL   Nitrite NEGATIVE NEGATIVE   Leukocytes,Ua NEGATIVE NEGATIVE   RBC / HPF 0-5 0 - 5 RBC/hpf   WBC, UA 0-5 0 - 5 WBC/hpf   Bacteria, UA NONE SEEN NONE SEEN   Squamous Epithelial / LPF 6-10 0 - 5    Comment: Performed at [facility], [address] [place], [place] [address]   Ct Abdomen Pelvis W Contrast  Result Date: 07/23/2019 CLINICAL DATA:  Abdominal pain EXAM: CT ABDOMEN AND PELVIS WITH CONTRAST TECHNIQUE: Multidetector CT imaging of the abdomen and pelvis was performed using the standard protocol following bolus administration of intravenous contrast. CONTRAST:  100mL ISOVUE-300 IOPAMIDOL (ISOVUE-300) INJECTION 61% COMPARISON:  None. FINDINGS: Lower chest: Lung bases are clear. Hepatobiliary: There are multiple cysts throughout the liver, largest measuring approximately 3 x 2.5 cm. No noncystic liver lesions are evident. 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 The gallbladder is absent. There is no biliary duct dilatation. Pancreas: There is no pancreatic mass or inflammatory focus. Spleen: There is an apparent hemangioma in the medial spleen measuring 1.4 x 1.0 cm. No other splenic lesions are evident. Adrenals/Urinary Tract: Adrenals bilaterally appear normal. There are cysts in each kidney. Largest cyst arises from the lateral right kidney measuring 3.2 x 3.0 cm. There is no hydronephrosis on either side. There is no evident renal or ureteral calculus on either side. Urinary bladder is midline with wall thickness within normal limits. Stomach/Bowel: There is no  appreciable diverticulitis. There is a left-sided spigelian type hernia slightly to the left of midline which contains several loops of bowel. The bowel loops within this hernia show mild thickening with adjacent fluid. There is, however, no appreciable bowel obstruction. Elsewhere, there is no appreciable  bowel wall thickening. The terminal ileum appears unremarkable. No free air or portal venous air evident. Vascular/Lymphatic: No abdominal aortic aneurysm. There is slight aortic and iliac artery atherosclerosis. No adenopathy is evident in the abdomen or pelvis. Reproductive: Uterus is absent. No pelvic mass. There is fluid in the cul-de-sac region. Other: There is no periappendiceal region inflammation. Appendix appears normal. There is no abscess in the abdomen or pelvis. As noted above, there is a left spigelian hernia slightly to the left of midline which contains loops of bowel with mildly thickened walls and moderate fluid. There is no frank bowel obstruction in this area. There is in addition a midline ventral hernia which contains fat but no bowel. Note that at the level of the spigelian hernia, the neck of the hernia from left to right measures 2.6 cm. The neck from superior to inferior dimension measures 3.0 cm. Musculoskeletal: No blastic or lytic bone lesions evident. No intramuscular lesions are evident. IMPRESSION: 1. Left anterior spigelian type hernia. Loops of small bowel extend into this hernia with localized wall thickening of these loops. There is also moderate fluid in this area. There is felt to be localized inflammation involving the bowel in this area, but there is no evident bowel obstruction. The appearance does raise concern for potential early bowel compromise involving the loops within this spigelian hernia. Surgical consultation in this regard may be advisable. No intramural bowel air.  No perforation in this area. 2. Elsewhere no bowel obstruction. No abscess in the abdomen or  pelvis. Appendix appears normal. 3. Fluid in the cul-de-sac potentially could indicate recent ovarian cyst rupture or have inflammatory etiology, possibly as a result of the spigelian hernia. 4. No renal or ureteral calculus. No hydronephrosis. Urinary bladder wall thickness normal. 5.  Several renal and hepatic cysts. 6.  Small benign splenic hemangioma. 7.  Gallbladder and uterus absent. Electronically Signed   By: Bretta BangWilliam  Woodruff III M.D.   On: 07/23/2019 13:37    Pending Labs Unresulted Labs (From admission, onward)    Start     Ordered   07/30/19 0500  Creatinine, serum  (enoxaparin (LOVENOX)    CrCl >/= 30 ml/min)  Weekly,   R    Comments: while on enoxaparin therapy    07/23/19 1501   07/24/19 0500  CBC  Tomorrow morning,   R     07/23/19 1501   07/24/19 0500  Magnesium  Tomorrow morning,   R     07/23/19 1501   07/24/19 0500  Basic metabolic panel  Tomorrow morning,   R     07/23/19 1501   07/23/19 1459  HIV antibody (Routine Testing)  Once,   STAT     07/23/19 1501   07/23/19 1440  SARS Coronavirus 2 [facility](Hospital order, Performed in [facility] hospital lab) Nasopharyngeal Nasopharyngeal Swab  (Symptomatic/High Risk of Exposure/Tier 1 Patients Labs with Precautions)  Once,   STAT    Question Answer Comment  Is this test for diagnosis or screening Screening   Symptomatic for COVID-19 as defined by CDC No   Hospitalized for COVID-19 No   Admitted to ICU for COVID-19 No   Previously tested for COVID-19 No   Resident in a congregate (group) care setting No   Employed in healthcare setting No   Pregnant No      07/23/19 1439          Vitals/Pain Today's Vitals   07/23/19 1245 07/23/19 1330 07/23/19 1400 07/23/19 1430  BP: 137/86 Marland Kitchen(!)  160/89 (!) 157/91 (!) 163/94  Pulse: 62 68 67 64  Resp: 17 17 15 14   Temp:      TempSrc:      SpO2: 95% 97% 97% 98%  PainSc:        Isolation Precautions Airborne and Contact precautions  Medications Medications  enoxaparin (LOVENOX)  injection 40 mg (has no administration in time range)  0.45 % sodium chloride infusion ( Intravenous Hold 07/23/19 1503)  metoprolol tartrate (LOPRESSOR) injection 5 mg (has no administration in time range)  pantoprazole (PROTONIX) injection 40 mg (has no administration in time range)  simethicone (MYLICON) chewable tablet 40 mg (has no administration in time range)  ondansetron (ZOFRAN-ODT) disintegrating tablet 4 mg (has no administration in time range)    Or  ondansetron (ZOFRAN) injection 4 mg (has no administration in time range)  polyethylene glycol (MIRALAX / GLYCOLAX) packet 17 g (has no administration in time range)  docusate sodium (COLACE) capsule 100 mg (has no administration in time range)  diphenhydrAMINE (BENADRYL) 12.5 MG/5ML elixir 12.5 mg (has no administration in time range)    Or  diphenhydrAMINE (BENADRYL) injection 12.5 mg (has no administration in time range)  methocarbamol (ROBAXIN) tablet 500 mg (has no administration in time range)  morphine 2 MG/ML injection 2-4 mg (has no administration in time range)  oxyCODONE (Oxy IR/ROXICODONE) immediate release tablet 5-10 mg (has no administration in time range)  acetaminophen (TYLENOL) tablet 650 mg (has no administration in time range)    Or  acetaminophen (TYLENOL) suppository 650 mg (has no administration in time range)  cefoTEtan (CEFOTAN) 2 g in sodium chloride 0.9 % 100 mL IVPB (has no administration in time range)  sodium chloride flush (NS) 0.9 % injection 3 mL (3 mLs Intravenous Given 07/23/19 1149)  morphine 2 MG/ML injection 2 mg (2 mg Intravenous Given 07/23/19 1149)  iopamidol (ISOVUE-300) 61 % injection 100 mL (100 mLs Intravenous Contrast Given 07/23/19 1309)    Mobility walks     Focused Assessments Abdominal Pain   R Recommendations: See Admitting Provider Note  Report given to:   Additional Notes:

## 2019-07-23 NOTE — Progress Notes (Signed)
Pt has 6 lap sites confirmed with second RN.  Tiney Rouge RN

## 2019-07-23 NOTE — ED Notes (Signed)
Patient transported to CT 

## 2019-07-23 NOTE — Transfer of Care (Signed)
Immediate Anesthesia Transfer of Care Note  Patient: Rhonda Payne  Procedure(s) Performed: laparotomy with hernia repair with mesh (N/A )  Patient Location: PACU  Anesthesia Type:General  Level of Consciousness: awake, alert  and oriented  Airway & Oxygen Therapy: Patient Spontanous Breathing and Patient connected to nasal cannula oxygen  Post-op Assessment: Report given to RN and Post -op Vital signs reviewed and stable  Post vital signs: Reviewed and stable  Last Vitals:  Vitals Value Taken Time  BP 155/86 07/23/19 1918  Temp 36.4 C 07/23/19 1916  Pulse 76 07/23/19 1923  Resp 11 07/23/19 1923  SpO2 98 % 07/23/19 1923  Vitals shown include unvalidated device data.  Last Pain:  Vitals:   07/23/19 1102  TempSrc:   PainSc: 6          Complications: No apparent anesthesia complications

## 2019-07-23 NOTE — Op Note (Signed)
07/23/2019  6:52 PM  PATIENT:  Rhonda Payne  63 y.o. female  PRE-OPERATIVE DIAGNOSIS:  Incarcerated incisional hernia  POST-OPERATIVE DIAGNOSIS:  Incarcerated incisional Hernia x 2  PROCEDURE:  Procedure(s): Laparoscopic incisional hernia repair with mesh x2   SURGEON:  Surgeon(s) and Role:    Ralene Ok, MD - Primary  ANESTHESIA:   local and general  EBL:  25 mL   BLOOD ADMINISTERED:none  DRAINS: none   LOCAL MEDICATIONS USED:  BUPIVICAINE   SPECIMEN:  No Specimen  DISPOSITION OF SPECIMEN:  N/A  COUNTS:  YES  TOURNIQUET:  * No tourniquets in log *  DICTATION: .Dragon Dictation     Findings: Patient had 2 incisional hernias.  One was at the umbilicus most severe.  The 1 inferior to the umbilicus had several loops of small bowel that were chronically incarcerated.  These were reduced.  The hernia in this area was approximately 2 cm.  The area at the umbilicus had incarcerated omentum.  This was reduced and this was also approximately 2 cm.  A piece of 20 cm ventral light ST mesh was used to cover both hernias.  This overlapped by approximately 4 cm of each of the hernia sites.  Details of the procedure:  After the patient was consented patient was taken back to the operating room patient was then placed in supine position bilateral SCDs in place.  The patient was prepped and draped in the usual sterile fashion. After antibiotics were confirmed a timeout was called and all facts were verified. The Veress needle technique was used to insuflate the abdomen at Palmer's point. The abdomen was insufflated to 14 mm mercury. Subsequently a 5 mm trocar was placed a camera inserted there was no injury to any intra-abdominal organs.    There was seen to be an incarcerated  Incisional and umbilical hernia.  The incisional hernia inferior to the umbilicus appeared to have small bowel within the hernia.  This was incarcerated.  A second camera port was in placed into the left  lower quadrant.   A 6mm port was placed in the epigastrium .   I proceeded to reduce the hernia contents of both the umbilical and incisional hernia.  The fascia at the hernia was reapproximated using a #0 Novafil x 3 for each independent hernia..  Once the hernia was cleared away, a Bard Ventralight 20cm  mesh was inserted into the abdomen.  The mesh was secured circumferentially with am Securestrap tacker in a double crown fashion.   The omentum was brought over the area of the mesh. The pneumoperitoneum was evacuated  & all trocars  were removed. The skin was reapproximated with 4-0  Monocryl sutures in a subcuticular fashion. The skin was dressed with Dermabond.  The patient was taken to the recovery room in stable condition.  Type of repair -primary suture & mesh  Mesh overlap - 4cm  Placement of mesh -  beneath fascia and into peritoneal cavity   PLAN OF CARE: Admit for overnight observation  PATIENT DISPOSITION:  PACU - hemodynamically stable.   Delay start of Pharmacological VTE agent (>24hrs) due to surgical blood loss or risk of bleeding: not applicable

## 2019-07-23 NOTE — ED Triage Notes (Signed)
Pt has pain in mid and and in LLQ where she has a known hernia. Pt thought yesterday this was due to constipation so had taken a stool soften. Pt did have a BM but did not relieve the pain. Denies any n/v/d.

## 2019-07-24 ENCOUNTER — Other Ambulatory Visit: Payer: Self-pay

## 2019-07-24 ENCOUNTER — Encounter (HOSPITAL_COMMUNITY): Payer: Self-pay | Admitting: General Surgery

## 2019-07-24 LAB — HIV ANTIBODY (ROUTINE TESTING W REFLEX): HIV Screen 4th Generation wRfx: NONREACTIVE

## 2019-07-24 LAB — BASIC METABOLIC PANEL
Anion gap: 11 (ref 5–15)
BUN: 13 mg/dL (ref 8–23)
CO2: 23 mmol/L (ref 22–32)
Calcium: 9 mg/dL (ref 8.9–10.3)
Chloride: 103 mmol/L (ref 98–111)
Creatinine, Ser: 1.11 mg/dL — ABNORMAL HIGH (ref 0.44–1.00)
GFR calc Af Amer: >60 mL/min (ref >60)
GFR calc non Af Amer: 54 mL/min — ABNORMAL LOW (ref >60)
Glucose, Bld: 143 mg/dL — ABNORMAL HIGH (ref 70–99)
Potassium: 3.9 mmol/L (ref 3.5–5.1)
Sodium: 137 mmol/L (ref 135–145)

## 2019-07-24 LAB — CBC
HCT: 42.6 % (ref 36.0–46.0)
Hemoglobin: 13.5 g/dL (ref 12.0–15.0)
MCH: 25.6 pg — ABNORMAL LOW (ref 26.0–34.0)
MCHC: 31.7 g/dL (ref 30.0–36.0)
MCV: 80.8 fL (ref 80.0–100.0)
Platelets: 242 10*3/uL (ref 150–400)
RBC: 5.27 MIL/uL — ABNORMAL HIGH (ref 3.87–5.11)
RDW: 14.1 % (ref 11.5–15.5)
WBC: 9.3 10*3/uL (ref 4.0–10.5)
nRBC: 0 % (ref 0.0–0.2)

## 2019-07-24 LAB — MAGNESIUM: Magnesium: 1.8 mg/dL (ref 1.7–2.4)

## 2019-07-24 MED ORDER — HYDROCHLOROTHIAZIDE 25 MG PO TABS
25.0000 mg | ORAL_TABLET | Freq: Every day | ORAL | Status: DC
  Administered 2019-07-25 – 2019-07-31 (×8): 25 mg via ORAL
  Filled 2019-07-24 (×8): qty 1

## 2019-07-24 MED ORDER — LISINOPRIL 20 MG PO TABS
20.0000 mg | ORAL_TABLET | Freq: Every day | ORAL | Status: DC
  Administered 2019-07-25 – 2019-07-31 (×8): 20 mg via ORAL
  Filled 2019-07-24 (×8): qty 1

## 2019-07-24 NOTE — Plan of Care (Signed)
  Problem: Clinical Measurements: Goal: Ability to maintain clinical measurements within normal limits will improve Outcome: Progressing   Problem: Coping: Goal: Level of anxiety will decrease Outcome: Progressing   Problem: Pain Managment: Goal: General experience of comfort will improve Outcome: Progressing   

## 2019-07-24 NOTE — Evaluation (Addendum)
Physical Therapy Evaluation Patient Details Name: Rhonda RampJeanette Snyders MRN: [id number] DOB: [birth year] Today's Date: 07/24/2019   History of Present Illness  Pt is a 63 y.o. F with significant PMH of HTN, obesity who presented with abdomain pain now s/p laparoscopic incisional hernia repair with mesh x 2 8/11.  Clinical Impression  Pt admitted s/p procedure above. Pt presents with decreased functional mobility secondary to surgical pain. On PT evaluation, pt ambulating 150 feet with no assistive device at supervision level. Reports fair pain control (premedicated prior). Education re: lifting restrictions, activity modifications, gentle splinting, bed mobility. No PT follow up recommended, will follow acutely to encourage continued mobilization.      Follow Up Recommendations No PT follow up    Equipment Recommendations  None recommended by PT    Recommendations for Other Services       Precautions / Restrictions Precautions Precautions: None Restrictions Weight Bearing Restrictions: No      Mobility  Bed Mobility Overal bed mobility: Modified Independent             General bed mobility comments: cues for log roll technique for pain control  Transfers Overall transfer level: Modified independent Equipment used: None             General transfer comment: Increased time required to achieve upright  Ambulation/Gait Ambulation/Gait assistance: Supervision Gait Distance (Feet): 150 Feet Assistive device: None Gait Pattern/deviations: Step-through pattern;Decreased stride length;Narrow base of support Gait velocity: decreased   General Gait Details: Slow, guarded gait speed. No overt LOB  Stairs            Wheelchair Mobility    Modified Rankin (Stroke Patients Only)       Balance Overall balance assessment: Mild deficits observed, not formally tested                                           Pertinent Vitals/Pain Pain Assessment:  Faces Faces Pain Scale: Hurts little more Pain Location: abdomen Pain Descriptors / Indicators: Sharp;Guarding;Discomfort Pain Intervention(s): Monitored during session;Premedicated before session    Home Living Family/patient expects to be discharged to:: Private residence Living Arrangements: Spouse/significant other Available Help at Discharge: Family Type of Home: House Home Access: Level entry     Home Layout: One level Home Equipment: None      Prior Function Level of Independence: Independent         Comments: Enjoys Audiological scientistknitting     Hand Dominance        Extremity/Trunk Assessment   Upper Extremity Assessment Upper Extremity Assessment: Overall WFL for tasks assessed    Lower Extremity Assessment Lower Extremity Assessment: Overall WFL for tasks assessed    Cervical / Trunk Assessment Cervical / Trunk Assessment: Normal  Communication   Communication: No difficulties  Cognition Arousal/Alertness: Awake/alert Behavior During Therapy: WFL for tasks assessed/performed Overall Cognitive Status: Within Functional Limits for tasks assessed                                        General Comments      Exercises     Assessment/Plan    PT Assessment Patient needs continued PT services  PT Problem List Pain;Decreased mobility       PT Treatment Interventions Gait training;Stair training;Functional mobility training;Therapeutic activities;Therapeutic exercise;Balance training;Patient/family  education    PT Goals (Current goals can be found in the Care Plan section)  Acute Rehab PT Goals Patient Stated Goal: "less pain." PT Goal Formulation: With patient Time For Goal Achievement: 08/07/19 Potential to Achieve Goals: Good    Frequency Min 3X/week   Barriers to discharge        Co-evaluation               AM-PAC PT "6 Clicks" Mobility  Outcome Measure Help needed turning from your back to your side while in a flat bed  without using bedrails?: None Help needed moving from lying on your back to sitting on the side of a flat bed without using bedrails?: None Help needed moving to and from a bed to a chair (including a wheelchair)?: None Help needed standing up from a chair using your arms (e.g., wheelchair or bedside chair)?: None Help needed to walk in hospital room?: None Help needed climbing 3-5 steps with a railing? : A Little 6 Click Score: 23    End of Session   Activity Tolerance: Patient tolerated treatment well Patient left: in bed;with call bell/phone within reach   PT Visit Diagnosis: Pain Pain - part of body: (abdomen)    Time: 1535-1550 PT Time Calculation (min) (ACUTE ONLY): 15 min   Charges:   PT Evaluation $PT Eval Low Complexity: 1 Low          Ellamae Sia, PT, DPT Acute Rehabilitation Services Pager [phone] Office [phone]   Willy Eddy 07/24/2019, 4:15 PM

## 2019-07-24 NOTE — Progress Notes (Signed)
[facility] Progress Note  1 Day Post-Op  Subjective: CC-  Comfortable this morning. States that her abdomen is sore but pain well controlled with current medication regimen. Tolerating sips of clear liquids. Denies n/v. No flatus or BM. She is getting up to use the restroom.  Objective: Vital signs in last 24 hours: Temp:  [97.5 F (36.4 C)-98.8 F (37.1 C)] 98.8 F (37.1 C) (08/12 0439) Pulse Rate:  [57-80] 78 (08/12 0439) Resp:  [13-20] 18 (08/12 0439) BP: (133-180)/(71-106) 142/95 (08/12 0439) SpO2:  [95 %-100 %] 96 % (08/12 0439) Weight:  [111.1 kg] 111.1 kg (08/11 1631) Last BM Date: 07/22/19  Intake/Output from previous day: 08/11 0701 - 08/12 0700 In: 2032.1 [I.V.:2032.1] Out: 25 [Blood:25] Intake/Output this shift: No intake/output data recorded.  PE: Gen:  Alert, NAD, pleasant HEENT: EOM's intact, pupils equal and round Pulm:  Crate and effort normal Abd: obese, soft, nondistended, hypoactive BS, appropriately tender left abdomen without rebound or guarding, lap incisions cdi Skin: no rashes noted, warm and dry  Lab Results:  Recent Labs    07/23/19 1109 07/24/19 0424  WBC 7.0 9.3  HGB 14.2 13.5  HCT 44.9 42.6  PLT 236 242   BMET Recent Labs    07/23/19 1109 07/24/19 0424  NA 140 137  K 4.0 3.9  CL 107 103  CO2 25 23  GLUCOSE 107* 143*  BUN 11 13  CREATININE 1.08* 1.11*  CALCIUM 9.2 9.0   PT/INR No results for input(s): LABPROT, INR in the last 72 hours. CMP     Component Value Date/Time   NA 137 07/24/2019 0424   K 3.9 07/24/2019 0424   CL 103 07/24/2019 0424   CO2 23 07/24/2019 0424   GLUCOSE 143 (H) 07/24/2019 0424   BUN 13 07/24/2019 0424   CREATININE 1.11 (H) 07/24/2019 0424   CALCIUM 9.0 07/24/2019 0424   PROT 7.8 07/23/2019 1109   ALBUMIN 3.8 07/23/2019 1109   AST 21 07/23/2019 1109   ALT 13 07/23/2019 1109   ALKPHOS 47 07/23/2019 1109   BILITOT 1.0 07/23/2019 1109   GFRNONAA 54 (L) 07/24/2019 0424   GFRAA  >60 07/24/2019 0424   Lipase     Component Value Date/Time   LIPASE 23 07/23/2019 1109       Studies/Results: Ct Abdomen Pelvis W Contrast  Result Date: 07/23/2019 CLINICAL DATA:  Abdominal pain EXAM: CT ABDOMEN AND PELVIS WITH CONTRAST TECHNIQUE: Multidetector CT imaging of the abdomen and pelvis was performed using the standard protocol following bolus administration of intravenous contrast. CONTRAST:  139mL ISOVUE-300 IOPAMIDOL (ISOVUE-300) INJECTION 61% COMPARISON:  None. FINDINGS: Lower chest: Lung bases are clear. Hepatobiliary: There are multiple cysts throughout the liver, largest measuring approximately 3 x 2.5 cm. No noncystic liver lesions are evident. 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 The gallbladder is absent. There is no biliary duct dilatation. Pancreas: There is no pancreatic mass or inflammatory focus. Spleen: There is an apparent hemangioma in the medial spleen measuring 1.4 x 1.0 cm. No other splenic lesions are evident. Adrenals/Urinary Tract: Adrenals bilaterally appear normal. There are cysts in each kidney. Largest cyst arises from the lateral right kidney measuring 3.2 x 3.0 cm. There is no hydronephrosis on either side. There is no evident renal or ureteral calculus on either side. Urinary bladder is midline with wall thickness within normal limits. Stomach/Bowel: There is no appreciable diverticulitis. There is a left-sided spigelian type hernia slightly to the left of midline  which contains several loops of bowel. The bowel loops within this hernia show mild thickening with adjacent fluid. There is, however, no appreciable bowel obstruction. Elsewhere, there is no appreciable bowel wall thickening. The terminal ileum appears unremarkable. No free air or portal venous air evident. Vascular/Lymphatic: No abdominal aortic aneurysm. There is slight aortic and iliac artery atherosclerosis. No adenopathy is evident in the abdomen or pelvis. Reproductive:  Uterus is absent. No pelvic mass. There is fluid in the cul-de-sac region. Other: There is no periappendiceal region inflammation. Appendix appears normal. There is no abscess in the abdomen or pelvis. As noted above, there is a left spigelian hernia slightly to the left of midline which contains loops of bowel with mildly thickened walls and moderate fluid. There is no frank bowel obstruction in this area. There is in addition a midline ventral hernia which contains fat but no bowel. Note that at the level of the spigelian hernia, the neck of the hernia from left to right measures 2.6 cm. The neck from superior to inferior dimension measures 3.0 cm. Musculoskeletal: No blastic or lytic bone lesions evident. No intramuscular lesions are evident. IMPRESSION: 1. Left anterior spigelian type hernia. Loops of small bowel extend into this hernia with localized wall thickening of these loops. There is also moderate fluid in this area. There is felt to be localized inflammation involving the bowel in this area, but there is no evident bowel obstruction. The appearance does raise concern for potential early bowel compromise involving the loops within this spigelian hernia. Surgical consultation in this regard may be advisable. No intramural bowel air.  No perforation in this area. 2. Elsewhere no bowel obstruction. No abscess in the abdomen or pelvis. Appendix appears normal. 3. Fluid in the cul-de-sac potentially could indicate recent ovarian cyst rupture or have inflammatory etiology, possibly as a result of the spigelian hernia. 4. No renal or ureteral calculus. No hydronephrosis. Urinary bladder wall thickness normal. 5.  Several renal and hepatic cysts. 6.  Small benign splenic hemangioma. 7.  Gallbladder and uterus absent. Electronically Signed   By: Bretta BangWilliam  Woodruff III M.D.   On: 07/23/2019 13:37    Anti-infectives: Anti-infectives (From admission, onward)   Start     Dose/Rate Route Frequency Ordered Stop    07/23/19 1615  cefoTEtan (CEFOTAN) 2 g in sodium chloride 0.9 % 100 mL IVPB     2 g 200 mL/hr over 30 Minutes Intravenous On call to O.R. 07/23/19 1501 07/23/19 1820       Assessment/Plan HTN Morbid obesity AKI - ?some CKD. Cr 1.11, continue IVF  Incarcerated incisional Hernia x 2 S/p Laparoscopic incisional hernia repair with mesh x2 8/11 Dr. Derrell Lollingamirez - POD #1 - no flatus or BM  ID - cefotetan periop VTE - SCDs, lovenox FEN - decrease IVF @ 75cc/hr, CLD Foley - none Follow up - none  Plan - Continue clear liquids and await return in bowel function. Continue mobilizing. Consult PT. Labs in AM.   LOS: 0 days    Franne FortsBrooke A Meuth , [facility] 07/24/2019, 7:41 AM Pager: [phone] Mon-Thurs 7:00 am-4:30 pm Fri 7:00 am -11:30 AM Sat-Sun 7:00 am-11:30 am

## 2019-07-24 NOTE — Discharge Instructions (Signed)
[facility] _______[facility] ° °HERNIA REPAIR: POST OP INSTRUCTIONS ° °Always review your discharge instruction sheet given to you by the facility where your surgery was performed. °IF YOU HAVE DISABILITY OR FAMILY LEAVE FORMS, YOU MUST BRING THEM TO THE OFFICE FOR PROCESSING.   °DO NOT GIVE THEM TO YOUR DOCTOR. ° °1. A  prescription for pain medication may be given to you upon discharge.  Take your pain medication as prescribed, if needed.  If narcotic pain medicine is not needed, then you may take acetaminophen (Tylenol) or ibuprofen (Advil) as needed. °2. Take your usually prescribed medications unless otherwise directed. °If you need a refill on your pain medication, please contact your pharmacy.  They will contact our office to request authorization. Prescriptions will not be filled after 5 pm or on week-ends. °3. You should follow a light diet the first 24 hours after arrival home, such as soup and crackers, etc.  Be sure to include lots of fluids daily.  Resume your normal diet the day after surgery. °4.Most patients will experience some swelling and bruising around the umbilicus or in the groin and scrotum.  Ice packs and reclining will help.  Swelling and bruising can take several days to resolve.  °6. It is common to experience some constipation if taking pain medication after surgery.  Increasing fluid intake and taking a stool softener (such as Colace) will usually help or prevent this problem from occurring.  A mild laxative (Milk of Magnesia or Miralax) should be taken according to package directions if there are no bowel movements after 48 hours. °7. Unless discharge instructions indicate otherwise, you may remove your bandages 24-48 hours after surgery, and you may shower at that time.  You may have steri-strips (small skin tapes) in place directly over the incision.  These strips should be left on the skin for 7-10 days.  If your surgeon used skin glue on the incision, you may shower in  24 hours.  The glue will flake off over the next 2-3 weeks.  Any sutures or staples will be removed at the office during your follow-up visit. °8. ACTIVITIES:  You may resume regular (light) daily activities beginning the next day--such as daily self-care, walking, climbing stairs--gradually increasing activities as tolerated.  You may have sexual intercourse when it is comfortable.  Refrain from any heavy lifting or straining until approved by your doctor. ° °a.You may drive when you are no longer taking prescription pain medication, you can comfortably wear a seatbelt, and you can safely maneuver your car and apply brakes. °b.RETURN TO WORK:   °_____________________________________________ ° °9.You should see your doctor in the office for a follow-up appointment approximately 2-3 weeks after your surgery.  Make sure that you call for this appointment within a day or two after you arrive home to insure a convenient appointment time. °10.OTHER INSTRUCTIONS: _________________________ °   _____________________________________ ° °WHEN TO CALL YOUR DOCTOR: °1. Fever over 101.0 °2. Inability to urinate °3. Nausea and/or vomiting °4. Extreme swelling or bruising °5. Continued bleeding from incision. °6. Increased pain, redness, or drainage from the incision ° °The clinic staff is available to answer your questions during regular business hours.  Please don’t hesitate to call and ask to speak to one of the nurses for clinical concerns.  If you have a medical emergency, go to the nearest emergency room or call 911.  A surgeon from [facility] is always on call at the hospital ° ° °[address], [address], [place], [place]  [address] ? ° [address] [address], [place], [place]   [address] °[phone] ? [phone] ? FAX [phone] °Web site: [email or web address] ° °

## 2019-07-24 NOTE — Progress Notes (Signed)
2030 Pt received from PACU, AOX4 and in severe pain with 6 abdominal lap sites which is confirmed with second RN. Angelina Pih. RN VSS: Temp- 97.5, BP- 145/73 (96), PR- 80, RR- 19, Sp02- 97% on 2L nasal cannula. Oriented to room, bed controls and plan of care. Given PRN dilaudid 1 mg for pain. Left lying comfortably in bed and will continue to monitor.

## 2019-07-25 ENCOUNTER — Inpatient Hospital Stay (HOSPITAL_COMMUNITY): Payer: Self-pay

## 2019-07-25 LAB — BASIC METABOLIC PANEL
Anion gap: 12 (ref 5–15)
BUN: 15 mg/dL (ref 8–23)
CO2: 23 mmol/L (ref 22–32)
Calcium: 9.2 mg/dL (ref 8.9–10.3)
Chloride: 102 mmol/L (ref 98–111)
Creatinine, Ser: 1.16 mg/dL — ABNORMAL HIGH (ref 0.44–1.00)
GFR calc Af Amer: 59 mL/min — ABNORMAL LOW (ref >60)
GFR calc non Af Amer: 51 mL/min — ABNORMAL LOW (ref >60)
Glucose, Bld: 90 mg/dL (ref 70–99)
Potassium: 3.5 mmol/L (ref 3.5–5.1)
Sodium: 137 mmol/L (ref 135–145)

## 2019-07-25 LAB — MAGNESIUM: Magnesium: 2 mg/dL (ref 1.7–2.4)

## 2019-07-25 MED ORDER — ACETAMINOPHEN 650 MG RE SUPP: 650.0000 mg | Freq: Three times a day (TID) | RECTAL | Status: DC

## 2019-07-25 MED ORDER — METHOCARBAMOL 1000 MG/10ML IJ SOLN
500.0000 mg | Freq: Three times a day (TID) | INTRAVENOUS | Status: DC
  Administered 2019-07-25 – 2019-08-03 (×25): 500 mg via INTRAVENOUS
  Filled 2019-07-25: qty 5
  Filled 2019-07-25 (×2): qty 500
  Filled 2019-07-25 (×2): qty 5
  Filled 2019-07-25: qty 500
  Filled 2019-07-25: qty 5
  Filled 2019-07-25: qty 500
  Filled 2019-07-25: qty 5
  Filled 2019-07-25: qty 500
  Filled 2019-07-25 (×3): qty 5
  Filled 2019-07-25: qty 500
  Filled 2019-07-25: qty 5
  Filled 2019-07-25: qty 500
  Filled 2019-07-25 (×4): qty 5
  Filled 2019-07-25: qty 500
  Filled 2019-07-25 (×9): qty 5

## 2019-07-25 MED ORDER — CALCIUM CARBONATE ANTACID 500 MG PO CHEW
1.0000 | CHEWABLE_TABLET | Freq: Two times a day (BID) | ORAL | Status: DC
  Administered 2019-07-25 – 2019-07-30 (×9): 200 mg via ORAL
  Filled 2019-07-25 (×11): qty 1

## 2019-07-25 MED ORDER — KCL IN DEXTROSE-NACL 20-5-0.45 MEQ/L-%-% IV SOLN
INTRAVENOUS | Status: DC
  Administered 2019-07-25 – 2019-07-31 (×10): via INTRAVENOUS
  Filled 2019-07-25 (×14): qty 1000

## 2019-07-25 MED ORDER — POTASSIUM CHLORIDE 10 MEQ/100ML IV SOLN
10.0000 meq | INTRAVENOUS | Status: AC
  Administered 2019-07-25 (×3): 10 meq via INTRAVENOUS
  Filled 2019-07-25 (×2): qty 100

## 2019-07-25 MED ORDER — ACETAMINOPHEN 500 MG PO TABS
1000.0000 mg | ORAL_TABLET | Freq: Three times a day (TID) | ORAL | Status: DC
  Administered 2019-07-25 – 2019-07-26 (×3): 1000 mg via ORAL
  Filled 2019-07-25 (×4): qty 2

## 2019-07-25 NOTE — Anesthesia Postprocedure Evaluation (Signed)
Anesthesia Post Note  Patient: Rhonda Payne  Procedure(s) Performed: laparotomy with hernia repair with mesh (N/A )     Patient location during evaluation: PACU Anesthesia Type: General Level of consciousness: awake and alert Pain management: pain level controlled Vital Signs Assessment: post-procedure vital signs reviewed and stable Respiratory status: spontaneous breathing, nonlabored ventilation, respiratory function stable and patient connected to nasal cannula oxygen Cardiovascular status: stable and blood pressure returned to baseline Postop Assessment: no apparent nausea or vomiting Anesthetic complications: no    Last Vitals:  Vitals:   07/25/19 0438 07/25/19 0949  BP: 108/72 (!) 145/100  Pulse: 67 90  Resp: 16   Temp: 36.8 C   SpO2: 96%     Last Pain:  Vitals:   07/25/19 0945  TempSrc:   PainSc: 10-Worst pain ever                 Alvera Tourigny DAVID

## 2019-07-25 NOTE — Progress Notes (Signed)
[facility] Progress Note  2 Days Post-Op  Subjective: CC-  Hurting more this morning than yesterday. States that she started feeling worse last night and vomited twice. Thinks it may be have the oral pain medication on an empty stomach. Still a little nauseated this morning sipping on liquids. No flatus or BM.  Objective: Vital signs in last 24 hours: Temp:  [97.6 F (36.4 C)-98.5 F (36.9 C)] 98.2 F (36.8 C) (08/13 0438) Pulse Rate:  [67-79] 67 (08/13 0438) Resp:  [15-18] 16 (08/13 0438) BP: (108-166)/(72-103) 108/72 (08/13 0438) SpO2:  [96 %-99 %] 96 % (08/13 0438) Last BM Date: 07/22/19  Intake/Output from previous day: 08/12 0701 - 08/13 0700 In: 720 [P.O.:120; I.V.:600] Out: -  Intake/Output this shift: No intake/output data recorded.  PE: Gen:  Alert, NAD, pleasant HEENT: EOM's intact, pupils equal and round Pulm:  Rate and effort normal Abd: obese, soft, nondistended, hypoactive BS, mild diffuse tenderness without peritonitis, lap incisions cdi Skin: no rashes noted, warm and dry   Lab Results:  Recent Labs    07/23/19 1109 07/24/19 0424  WBC 7.0 9.3  HGB 14.2 13.5  HCT 44.9 42.6  PLT 236 242   BMET Recent Labs    07/24/19 0424 07/25/19 0500  NA 137 137  K 3.9 3.5  CL 103 102  CO2 23 23  GLUCOSE 143* 90  BUN 13 15  CREATININE 1.11* 1.16*  CALCIUM 9.0 9.2   PT/INR No results for input(s): LABPROT, INR in the last 72 hours. CMP     Component Value Date/Time   NA 137 07/25/2019 0500   K 3.5 07/25/2019 0500   CL 102 07/25/2019 0500   CO2 23 07/25/2019 0500   GLUCOSE 90 07/25/2019 0500   BUN 15 07/25/2019 0500   CREATININE 1.16 (H) 07/25/2019 0500   CALCIUM 9.2 07/25/2019 0500   PROT 7.8 07/23/2019 1109   ALBUMIN 3.8 07/23/2019 1109   AST 21 07/23/2019 1109   ALT 13 07/23/2019 1109   ALKPHOS 47 07/23/2019 1109   BILITOT 1.0 07/23/2019 1109   GFRNONAA 51 (L) 07/25/2019 0500   GFRAA 59 (L) 07/25/2019 0500   Lipase      Component Value Date/Time   LIPASE 23 07/23/2019 1109       Studies/Results: Ct Abdomen Pelvis W Contrast  Result Date: 07/23/2019 CLINICAL DATA:  Abdominal pain EXAM: CT ABDOMEN AND PELVIS WITH CONTRAST TECHNIQUE: Multidetector CT imaging of the abdomen and pelvis was performed using the standard protocol following bolus administration of intravenous contrast. CONTRAST:  100mL ISOVUE-300 IOPAMIDOL (ISOVUE-300) INJECTION 61% COMPARISON:  None. FINDINGS: Lower chest: Lung bases are clear. Hepatobiliary: There are multiple cysts throughout the liver, largest measuring approximately 3 x 2.5 cm. No noncystic liver lesions are evident. 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 The gallbladder is absent. There is no biliary duct dilatation. Pancreas: There is no pancreatic mass or inflammatory focus. Spleen: There is an apparent hemangioma in the medial spleen measuring 1.4 x 1.0 cm. No other splenic lesions are evident. Adrenals/Urinary Tract: Adrenals bilaterally appear normal. There are cysts in each kidney. Largest cyst arises from the lateral right kidney measuring 3.2 x 3.0 cm. There is no hydronephrosis on either side. There is no evident renal or ureteral calculus on either side. Urinary bladder is midline with wall thickness within normal limits. Stomach/Bowel: There is no appreciable diverticulitis. There is a left-sided spigelian type hernia slightly to the left of midline which contains  several loops of bowel. The bowel loops within this hernia show mild thickening with adjacent fluid. There is, however, no appreciable bowel obstruction. Elsewhere, there is no appreciable bowel wall thickening. The terminal ileum appears unremarkable. No free air or portal venous air evident. Vascular/Lymphatic: No abdominal aortic aneurysm. There is slight aortic and iliac artery atherosclerosis. No adenopathy is evident in the abdomen or pelvis. Reproductive: Uterus is absent. No pelvic mass.  There is fluid in the cul-de-sac region. Other: There is no periappendiceal region inflammation. Appendix appears normal. There is no abscess in the abdomen or pelvis. As noted above, there is a left spigelian hernia slightly to the left of midline which contains loops of bowel with mildly thickened walls and moderate fluid. There is no frank bowel obstruction in this area. There is in addition a midline ventral hernia which contains fat but no bowel. Note that at the level of the spigelian hernia, the neck of the hernia from left to right measures 2.6 cm. The neck from superior to inferior dimension measures 3.0 cm. Musculoskeletal: No blastic or lytic bone lesions evident. No intramuscular lesions are evident. IMPRESSION: 1. Left anterior spigelian type hernia. Loops of small bowel extend into this hernia with localized wall thickening of these loops. There is also moderate fluid in this area. There is felt to be localized inflammation involving the bowel in this area, but there is no evident bowel obstruction. The appearance does raise concern for potential early bowel compromise involving the loops within this spigelian hernia. Surgical consultation in this regard may be advisable. No intramural bowel air.  No perforation in this area. 2. Elsewhere no bowel obstruction. No abscess in the abdomen or pelvis. Appendix appears normal. 3. Fluid in the cul-de-sac potentially could indicate recent ovarian cyst rupture or have inflammatory etiology, possibly as a result of the spigelian hernia. 4. No renal or ureteral calculus. No hydronephrosis. Urinary bladder wall thickness normal. 5.  Several renal and hepatic cysts. 6.  Small benign splenic hemangioma. 7.  Gallbladder and uterus absent. Electronically Signed   By: Lowella Grip III M.D.   On: 07/23/2019 13:37    Anti-infectives: Anti-infectives (From admission, onward)   Start     Dose/Rate Route Frequency Ordered Stop   07/23/19 1615  cefoTEtan (CEFOTAN)  2 g in sodium chloride 0.9 % 100 mL IVPB     2 g 200 mL/hr over 30 Minutes Intravenous On call to O.R. 07/23/19 1501 07/23/19 1820       Assessment/Plan HTN Morbid obesity AKI - ?some CKD. Cr 1.16, continue IVF Hypokalemia - K 3.5, replace  Incarcerated incisionalHerniax 2 S/p Laparoscopic incisional hernia repair with mesh x28/11 Dr. Rosendo Gros - POD #2 - no flatus or BM - increased nausea and emesis x2 last night  ID -cefotetan periop VTE -SCDs, lovenox FEN - IVF @ 75cc/hr, CLD Foley -none Follow up -none  Plan - Schedule tylenol and robaxin for better pain control. Continue clears as tolerated. Mobilize. Replace potassium. Repeat labs in AM.   LOS: 1 day    Wellington Hampshire, [facility] 07/25/2019, 8:32 AM Pager: [phone] Mon-Thurs 7:00 am-4:30 pm Fri 7:00 am -11:30 AM Sat-Sun 7:00 am-11:30 am

## 2019-07-26 LAB — CBC
HCT: 43 % (ref 36.0–46.0)
Hemoglobin: 13.7 g/dL (ref 12.0–15.0)
MCH: 25.4 pg — ABNORMAL LOW (ref 26.0–34.0)
MCHC: 31.9 g/dL (ref 30.0–36.0)
MCV: 79.8 fL — ABNORMAL LOW (ref 80.0–100.0)
Platelets: 222 10*3/uL (ref 150–400)
RBC: 5.39 MIL/uL — ABNORMAL HIGH (ref 3.87–5.11)
RDW: 14 % (ref 11.5–15.5)
WBC: 7.3 10*3/uL (ref 4.0–10.5)
nRBC: 0 % (ref 0.0–0.2)

## 2019-07-26 LAB — BASIC METABOLIC PANEL
Anion gap: 12 (ref 5–15)
BUN: 13 mg/dL (ref 8–23)
CO2: 21 mmol/L — ABNORMAL LOW (ref 22–32)
Calcium: 9.4 mg/dL (ref 8.9–10.3)
Chloride: 104 mmol/L (ref 98–111)
Creatinine, Ser: 1.16 mg/dL — ABNORMAL HIGH (ref 0.44–1.00)
GFR calc Af Amer: 59 mL/min — ABNORMAL LOW (ref >60)
GFR calc non Af Amer: 51 mL/min — ABNORMAL LOW (ref >60)
Glucose, Bld: 94 mg/dL (ref 70–99)
Potassium: 3.7 mmol/L (ref 3.5–5.1)
Sodium: 137 mmol/L (ref 135–145)

## 2019-07-26 MED ORDER — ALUM & MAG HYDROXIDE-SIMETH 200-200-20 MG/5ML PO SUSP
30.0000 mL | ORAL | Status: DC | PRN
  Administered 2019-07-26 – 2019-07-29 (×4): 30 mL via ORAL
  Filled 2019-07-26 (×4): qty 30

## 2019-07-26 MED ORDER — POLYETHYLENE GLYCOL 3350 17 G PO PACK
17.0000 g | PACK | Freq: Every day | ORAL | Status: DC
  Administered 2019-07-26 – 2019-07-29 (×4): 17 g via ORAL
  Filled 2019-07-26 (×5): qty 1

## 2019-07-26 MED ORDER — POTASSIUM CHLORIDE 10 MEQ/50ML IV SOLN
10.0000 meq | INTRAVENOUS | Status: AC
  Administered 2019-07-26 (×3): 10 meq via INTRAVENOUS
  Filled 2019-07-26 (×3): qty 50

## 2019-07-26 NOTE — Progress Notes (Addendum)
3 Days Post-Op   Subjective/Chief Complaint: Some flatus, very sore, no emesis   Objective: Vital signs in last 24 hours: Temp:  [98.3 F (36.8 C)-99.2 F (37.3 C)] 98.3 F (36.8 C) (08/14 0518) Pulse Rate:  [79-96] 79 (08/14 0518) Resp:  [17-18] 17 (08/14 0518) BP: (135-149)/(90-100) 135/91 (08/14 0518) SpO2:  [96 %-98 %] 98 % (08/14 0518) Last BM Date: 07/23/19  Intake/Output from previous day: 08/13 0701 - 08/14 0700 In: 1752.6 [I.V.:1352.6; IV Piggyback:400] Out: -  Intake/Output this shift: No intake/output data recorded.  General appearance: no distress Resp: clear to auscultation bilaterally Cardio: regular rate and rhythm GI: approp tender some bs present incisions clean  Lab Results:  Recent Labs    07/24/19 0424 07/26/19 0547  WBC 9.3 7.3  HGB 13.5 13.7  HCT 42.6 43.0  PLT 242 222   BMET Recent Labs    07/25/19 0500 07/26/19 0547  NA 137 137  K 3.5 3.7  CL 102 104  CO2 23 21*  GLUCOSE 90 94  BUN 15 13  CREATININE 1.16* 1.16*  CALCIUM 9.2 9.4   PT/INR No results for input(s): LABPROT, INR in the last 72 hours. ABG No results for input(s): PHART, HCO3 in the last 72 hours.  Invalid input(s): PCO2, PO2  Studies/Results: Dg Abd 1 View  Result Date: 07/25/2019 CLINICAL DATA:  Ileus. EXAM: ABDOMEN - 1 VIEW COMPARISON:  CT 07/23/2019. FINDINGS: Surgical clips right upper quadrant. Several dilated loops of small bowel are noted. Reference is made to CT report of 07/23/2019. No free air identified. IMPRESSION: Several loops of dilated small bowel noted. Reference is made to CT report of 07/23/2019. Electronically Signed   By: Marcello Moores  Register   On: 07/25/2019 13:28    Anti-infectives: Anti-infectives (From admission, onward)   Start     Dose/Rate Route Frequency Ordered Stop   07/23/19 1615  cefoTEtan (CEFOTAN) 2 g in sodium chloride 0.9 % 100 mL IVPB     2 g 200 mL/hr over 30 Minutes Intravenous On call to O.R. 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

## 2019-07-26 NOTE — Progress Notes (Signed)
Physical Therapy Treatment Patient Details Name: Rhonda RampJeanette Broski MRN: [id number] DOB: March 30, 1956 Today's Date: 07/26/2019    History of Present Illness Pt is a 63 y.o. F with significant PMH of HTN, obesity who presented with abdomain pain now s/p laparoscopic incisional hernia repair with mesh x 2 8/11.    PT Comments    Pt making steady progress with functional mobility. She reported she has been up and moving a lot in her room on her own. Pt ambulated in hallway with supervision for safety while pushing IV pole. Somewhat limited secondary to pain; however, pt very motivated to move and to continue to progress. PT will continue to follow acutely to progress mobility as tolerated.    Follow Up Recommendations  No PT follow up     Equipment Recommendations  None recommended by PT    Recommendations for Other Services       Precautions / Restrictions Precautions Precautions: None Restrictions Weight Bearing Restrictions: No    Mobility  Bed Mobility               General bed mobility comments: pt standing in room upon arrival  Transfers Overall transfer level: Modified independent Equipment used: None                Ambulation/Gait Ambulation/Gait assistance: Supervision Gait Distance (Feet): 400 Feet Assistive device: IV Pole Gait Pattern/deviations: Trunk flexed;Step-through pattern;Decreased stride length Gait velocity: decreased   General Gait Details: pt with slow, cautious and guarded gait with mild instability but no overt LOB or need for physical assistance. pt needing brief standing rest break x2   Stairs             Wheelchair Mobility    Modified Rankin (Stroke Patients Only)       Balance Overall balance assessment: Needs assistance Sitting-balance support: Feet supported Sitting balance-Leahy Scale: Good     Standing balance support: No upper extremity supported Standing balance-Leahy Scale: Fair                               Cognition Arousal/Alertness: Awake/alert Behavior During Therapy: WFL for tasks assessed/performed Overall Cognitive Status: Within Functional Limits for tasks assessed                                        Exercises      General Comments        Pertinent Vitals/Pain Pain Assessment: Faces Faces Pain Scale: Hurts little more Pain Location: abdomen Pain Descriptors / Indicators: Guarding;Grimacing Pain Intervention(s): Monitored during session;Repositioned    Home Living                      Prior Function            PT Goals (current goals can now be found in the care plan section) Acute Rehab PT Goals PT Goal Formulation: With patient Time For Goal Achievement: 08/07/19 Potential to Achieve Goals: Good Progress towards PT goals: Progressing toward goals    Frequency    Min 3X/week      PT Plan Current plan remains appropriate    Co-evaluation              AM-PAC PT "6 Clicks" Mobility   Outcome Measure  Help needed turning from your back to your side while in a  flat bed without using bedrails?: None Help needed moving from lying on your back to sitting on the side of a flat bed without using bedrails?: None Help needed moving to and from a bed to a chair (including a wheelchair)?: None Help needed standing up from a chair using your arms (e.g., wheelchair or bedside chair)?: None Help needed to walk in hospital room?: None Help needed climbing 3-5 steps with a railing? : A Little 6 Click Score: 23    End of Session   Activity Tolerance: Patient tolerated treatment well Patient left: in bed;with call bell/phone within reach Nurse Communication: Mobility status PT Visit Diagnosis: Pain Pain - part of body: (abdomen)     Time: 6283-1517 PT Time Calculation (min) (ACUTE ONLY): 23 min  Charges:  $Gait Training: 23-37 mins                     Sherie Don, [place], DPT  Acute Rehabilitation  Services Pager [phone] Office [place] 07/26/2019, 4:08 PM

## 2019-07-27 LAB — BASIC METABOLIC PANEL
Anion gap: 10 (ref 5–15)
BUN: 11 mg/dL (ref 8–23)
CO2: 21 mmol/L — ABNORMAL LOW (ref 22–32)
Calcium: 9.1 mg/dL (ref 8.9–10.3)
Chloride: 104 mmol/L (ref 98–111)
Creatinine, Ser: 1.24 mg/dL — ABNORMAL HIGH (ref 0.44–1.00)
GFR calc Af Amer: 54 mL/min — ABNORMAL LOW (ref >60)
GFR calc non Af Amer: 47 mL/min — ABNORMAL LOW (ref >60)
Glucose, Bld: 104 mg/dL — ABNORMAL HIGH (ref 70–99)
Potassium: 3.9 mmol/L (ref 3.5–5.1)
Sodium: 135 mmol/L (ref 135–145)

## 2019-07-27 NOTE — Progress Notes (Signed)
4 Days Post-Op   Subjective/Chief Complaint: Complains of pain in abd. She thinks it is a little better. Passing only small amount of flatus. No bm yet   Objective: Vital signs in last 24 hours: Temp:  [98.5 F (36.9 C)-99 F (37.2 C)] 98.5 F (36.9 C) (08/15 0438) Pulse Rate:  [75-87] 78 (08/15 0438) Resp:  [16-18] 16 (08/15 0438) BP: (111-141)/(80-95) 111/80 (08/15 0438) SpO2:  [97 %-100 %] 97 % (08/15 0438) Last BM Date: 07/23/19  Intake/Output from previous day: 08/14 0701 - 08/15 0700 In: 1512 [P.O.:480; I.V.:1032] Out: -  Intake/Output this shift: No intake/output data recorded.  General appearance: alert and cooperative Resp: clear to auscultation bilaterally Cardio: regular rate and rhythm GI: soft, mild to moderate tenderness. incisions ok  Lab Results:  Recent Labs    07/26/19 0547  WBC 7.3  HGB 13.7  HCT 43.0  PLT 222   BMET Recent Labs    07/26/19 0547 07/27/19 0411  NA 137 135  K 3.7 3.9  CL 104 104  CO2 21* 21*  GLUCOSE 94 104*  BUN 13 11  CREATININE 1.16* 1.24*  CALCIUM 9.4 9.1   PT/INR No results for input(s): LABPROT, INR in the last 72 hours. ABG No results for input(s): PHART, HCO3 in the last 72 hours.  Invalid input(s): PCO2, PO2  Studies/Results: Dg Abd 1 View  Result Date: 07/25/2019 CLINICAL DATA:  Ileus. EXAM: ABDOMEN - 1 VIEW COMPARISON:  CT 07/23/2019. FINDINGS: Surgical clips right upper quadrant. Several dilated loops of small bowel are noted. Reference is made to CT report of 07/23/2019. No free air identified. IMPRESSION: Several loops of dilated small bowel noted. Reference is made to CT report of 07/23/2019. Electronically Signed   By: Marcello Moores  Register   On: 07/25/2019 13:28    Anti-infectives: Anti-infectives (From admission, onward)   Start     Dose/Rate Route Frequency Ordered Stop   07/23/19 1615  cefoTEtan (CEFOTAN) 2 g in sodium chloride 0.9 % 100 mL IVPB     2 g 200 mL/hr over 30 Minutes Intravenous On call  to O.R. 07/23/19 1501 07/23/19 1820      Assessment/Plan: s/p Procedure(s): laparotomy with hernia repair with mesh (N/A) Advance diet. 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

## 2019-07-28 ENCOUNTER — Inpatient Hospital Stay (HOSPITAL_COMMUNITY): Payer: Self-pay

## 2019-07-28 LAB — CBC WITH DIFFERENTIAL/PLATELET
Abs Immature Granulocytes: 0.01 10*3/uL (ref 0.00–0.07)
Basophils Absolute: 0 10*3/uL (ref 0.0–0.1)
Basophils Relative: 0 %
Eosinophils Absolute: 0.3 10*3/uL (ref 0.0–0.5)
Eosinophils Relative: 4 %
HCT: 42.3 % (ref 36.0–46.0)
Hemoglobin: 13.2 g/dL (ref 12.0–15.0)
Immature Granulocytes: 0 %
Lymphocytes Relative: 24 %
Lymphs Abs: 1.7 10*3/uL (ref 0.7–4.0)
MCH: 25 pg — ABNORMAL LOW (ref 26.0–34.0)
MCHC: 31.2 g/dL (ref 30.0–36.0)
MCV: 80.3 fL (ref 80.0–100.0)
Monocytes Absolute: 0.6 10*3/uL (ref 0.1–1.0)
Monocytes Relative: 9 %
Neutro Abs: 4.5 10*3/uL (ref 1.7–7.7)
Neutrophils Relative %: 63 %
Platelets: 256 10*3/uL (ref 150–400)
RBC: 5.27 MIL/uL — ABNORMAL HIGH (ref 3.87–5.11)
RDW: 13.8 % (ref 11.5–15.5)
WBC: 7.1 10*3/uL (ref 4.0–10.5)
nRBC: 0 % (ref 0.0–0.2)

## 2019-07-28 LAB — BASIC METABOLIC PANEL
Anion gap: 18 — ABNORMAL HIGH (ref 5–15)
BUN: 14 mg/dL (ref 8–23)
CO2: 14 mmol/L — ABNORMAL LOW (ref 22–32)
Calcium: 9.4 mg/dL (ref 8.9–10.3)
Chloride: 105 mmol/L (ref 98–111)
Creatinine, Ser: 1.16 mg/dL — ABNORMAL HIGH (ref 0.44–1.00)
GFR calc Af Amer: 59 mL/min — ABNORMAL LOW (ref >60)
GFR calc non Af Amer: 51 mL/min — ABNORMAL LOW (ref >60)
Glucose, Bld: 96 mg/dL (ref 70–99)
Potassium: 4.2 mmol/L (ref 3.5–5.1)
Sodium: 137 mmol/L (ref 135–145)

## 2019-07-28 NOTE — Progress Notes (Signed)
5 Days Post-Op   Subjective/Chief Complaint: Feeling more distended and nauseated this am. No flatus   Objective: Vital signs in last 24 hours: Temp:  [97.9 F (36.6 C)-98.2 F (36.8 C)] 97.9 F (36.6 C) (08/16 0535) Pulse Rate:  [76-82] 82 (08/16 0535) Resp:  [16-18] 16 (08/16 0535) BP: (127-135)/(86-93) 127/86 (08/16 0535) SpO2:  [98 %-99 %] 99 % (08/16 0535) Last BM Date: 07/23/19  Intake/Output from previous day: 08/15 0701 - 08/16 0700 In: 770 [P.O.:370; I.V.:50; IV Piggyback:350] Out: 1 [Urine:1] Intake/Output this shift: No intake/output data recorded.  General appearance: alert and cooperative Resp: clear to auscultation bilaterally Cardio: regular rate and rhythm GI: soft, distended  Lab Results:  Recent Labs    07/26/19 0547  WBC 7.3  HGB 13.7  HCT 43.0  PLT 222   BMET Recent Labs    07/26/19 0547 07/27/19 0411  NA 137 135  K 3.7 3.9  CL 104 104  CO2 21* 21*  GLUCOSE 94 104*  BUN 13 11  CREATININE 1.16* 1.24*  CALCIUM 9.4 9.1   PT/INR No results for input(s): LABPROT, INR in the last 72 hours. ABG No results for input(s): PHART, HCO3 in the last 72 hours.  Invalid input(s): PCO2, PO2  Studies/Results: No results found.  Anti-infectives: Anti-infectives (From admission, onward)   Start     Dose/Rate Route Frequency Ordered Stop   07/23/19 1615  cefoTEtan (CEFOTAN) 2 g in sodium chloride 0.9 % 100 mL IVPB     2 g 200 mL/hr over 30 Minutes Intravenous On call to O.R. 07/23/19 1501 07/23/19 1820      Assessment/Plan: s/p Procedure(s): laparotomy with hernia repair with mesh (N/A) will make npo. check abd xrays POD 5 lap incarcerated incisional hernia repair-Ramirez -ileus continues -pain control issue but I think slowly getting better ID -cefotetanperiop VTE -SCDs, lovenox FEN -IVF at 100cc/hr. Recheck abd xrays Foley -none Follow up -Dr Rosendo Gros HTN Morbid obesity CKD II-Cr 1.16, continue IVF, will continuefollow  LOS:  4 days    Autumn Messing III 07/28/2019

## 2019-07-29 ENCOUNTER — Inpatient Hospital Stay: Payer: Self-pay

## 2019-07-29 ENCOUNTER — Inpatient Hospital Stay (HOSPITAL_COMMUNITY): Payer: Self-pay

## 2019-07-29 LAB — BASIC METABOLIC PANEL
Anion gap: 10 (ref 5–15)
BUN: 12 mg/dL (ref 8–23)
CO2: 22 mmol/L (ref 22–32)
Calcium: 9.5 mg/dL (ref 8.9–10.3)
Chloride: 106 mmol/L (ref 98–111)
Creatinine, Ser: 1.29 mg/dL — ABNORMAL HIGH (ref 0.44–1.00)
GFR calc Af Amer: 52 mL/min — ABNORMAL LOW (ref >60)
GFR calc non Af Amer: 45 mL/min — ABNORMAL LOW (ref >60)
Glucose, Bld: 108 mg/dL — ABNORMAL HIGH (ref 70–99)
Potassium: 4.3 mmol/L (ref 3.5–5.1)
Sodium: 138 mmol/L (ref 135–145)

## 2019-07-29 LAB — CBC
HCT: 39.9 % (ref 36.0–46.0)
Hemoglobin: 12.9 g/dL (ref 12.0–15.0)
MCH: 25.5 pg — ABNORMAL LOW (ref 26.0–34.0)
MCHC: 32.3 g/dL (ref 30.0–36.0)
MCV: 79 fL — ABNORMAL LOW (ref 80.0–100.0)
Platelets: 279 10*3/uL (ref 150–400)
RBC: 5.05 MIL/uL (ref 3.87–5.11)
RDW: 13.8 % (ref 11.5–15.5)
WBC: 7.4 10*3/uL (ref 4.0–10.5)
nRBC: 0 % (ref 0.0–0.2)

## 2019-07-29 LAB — GLUCOSE, CAPILLARY
Glucose-Capillary: 102 mg/dL — ABNORMAL HIGH (ref 70–99)
Glucose-Capillary: 110 mg/dL — ABNORMAL HIGH (ref 70–99)
Glucose-Capillary: 92 mg/dL (ref 70–99)

## 2019-07-29 MED ORDER — MAGNESIUM HYDROXIDE 400 MG/5ML PO SUSP
30.0000 mL | Freq: Every day | ORAL | Status: DC
  Administered 2019-07-29 – 2019-07-30 (×2): 30 mL via ORAL
  Filled 2019-07-29: qty 30

## 2019-07-29 MED ORDER — ACETAMINOPHEN 325 MG PO TABS: 650.0000 mg | ORAL_TABLET | Freq: Four times a day (QID) | ORAL | Status: DC | PRN

## 2019-07-29 MED ORDER — INSULIN ASPART 100 UNIT/ML ~~LOC~~ SOLN: 0.0000 [IU] | SUBCUTANEOUS | Status: DC

## 2019-07-29 NOTE — Progress Notes (Signed)
At patient bedside to obtain consent for PICC, after explaining purpose, risk, benefit and schedule for insertion, patient states that "this is a lot, I just wasn't expecting all this".  Discussed with patient that TPN had to go through central line and unfortunately if she requires belly rest then she could not get her nutrition orally.  Patient wishes to discuss with MD in morning.  PICC would not have been placed until tomorrow and TPN hung tomorrow evening so there is no delay in care to wait until patient speaks with MD.  Bedside RN updated and sticky note left for MD on chart.  Carolee Rota, RN VAST

## 2019-07-29 NOTE — Progress Notes (Signed)
6 Days Post-Op  Subjective: CC: Patient reports yesterday she became more distended and nauseated. She had an episode of emesis yesterday and again this morning. Denies current nausea or abdominal pain/ distension. Did pass a small amount of flatus yesterday evening as well. No BM since admission. Xrays yesterday showed ileus. She has been mobilizing  Objective: Vital signs in last 24 hours: Temp:  [98.1 F (36.7 C)-98.4 F (36.9 C)] 98.1 F (36.7 C) (08/17 0506) Pulse Rate:  [75-94] 86 (08/17 0506) Resp:  [18-19] 19 (08/17 0506) BP: (117-131)/(88-94) 123/94 (08/17 0506) SpO2:  [99 %-100 %] 100 % (08/17 0506) Last BM Date: 07/25/19  Intake/Output from previous day: 08/16 0701 - 08/17 0700 In: 1683.9 [I.V.:1683.9] Out: -  Intake/Output this shift: No intake/output data recorded.  PE: Gen: Alert, NAD, pleasant Pulm: Rate andeffort normal IPJ:ASNKN, soft, nondistended, hypoactive BS, nontender, lap incisions cdi Skin: no rashes noted, warm and dry   Lab Results:  Recent Labs    07/28/19 1010  WBC 7.1  HGB 13.2  HCT 42.3  PLT 256   BMET Recent Labs    07/27/19 0411 07/28/19 1010  NA 135 137  K 3.9 4.2  CL 104 105  CO2 21* 14*  GLUCOSE 104* 96  BUN 11 14  CREATININE 1.24* 1.16*  CALCIUM 9.1 9.4   PT/INR No results for input(s): LABPROT, INR in the last 72 hours. CMP     Component Value Date/Time   NA 137 07/28/2019 1010   K 4.2 07/28/2019 1010   CL 105 07/28/2019 1010   CO2 14 (L) 07/28/2019 1010   GLUCOSE 96 07/28/2019 1010   BUN 14 07/28/2019 1010   CREATININE 1.16 (H) 07/28/2019 1010   CALCIUM 9.4 07/28/2019 1010   PROT 7.8 07/23/2019 1109   ALBUMIN 3.8 07/23/2019 1109   AST 21 07/23/2019 1109   ALT 13 07/23/2019 1109   ALKPHOS 47 07/23/2019 1109   BILITOT 1.0 07/23/2019 1109   GFRNONAA 51 (L) 07/28/2019 1010   GFRAA 59 (L) 07/28/2019 1010   Lipase     Component Value Date/Time   LIPASE 23 07/23/2019 1109        Studies/Results: Dg Abd 2 Views  Result Date: 07/28/2019 CLINICAL DATA:  Abdominal distention.  Bloating and discomfort EXAM: ABDOMEN - 2 VIEW COMPARISON:  Abdominal CT from 5 days ago FINDINGS: Abdominal wall gas on the left correlating with recent surgery. Relatively gasless abdomen with visible fluid-filled bowel loops showing fluid levels. Bowel loops measure up to 3.5 cm, which are likely small bowel. Cholecystectomy clips. Moderate fluid filling of the stomach. Lung bases are clear. No evidence of pneumoperitoneum. IMPRESSION: Limited by relatively gasless abdomen. A few fluid-filled bowel loops are visible in this patient with suspected ileus per notes. A recurrent obstruction cannot be excluded on this study. Electronically Signed   By: Monte Fantasia M.D.   On: 07/28/2019 13:53    Anti-infectives: Anti-infectives (From admission, onward)   Start     Dose/Rate Route Frequency Ordered Stop   07/23/19 1615  cefoTEtan (CEFOTAN) 2 g in sodium chloride 0.9 % 100 mL IVPB     2 g 200 mL/hr over 30 Minutes Intravenous On call to O.R. 07/23/19 1501 07/23/19 1820       Assessment/Plan HTN Anemia  CKD - Cr trending down, check labs   Incarcerated incisional hernia POD 6 - Laparoscopic incarcerated incisional hernia repair with mesh x2 - Dr. Rosendo Gros, 07/23/2019 - Appears to still have an ongoing  ileus - Currently pain free and without nausea. If develops abdominal distension, nausea, or emesis recommended NGT for proximal decompression - Keep Mg >2 and K >4 for bowel function.  - Bowel rest. If ileus does not begin to improve, will have to consider TPN  - Repeat plain film. If WBC increases or develops abdominal pain could CT but would hold off right now given her reassuring exam and she is afebrile.   ID -cefotetanperiop VTE -SCDs, lovenox FEN -IVF@ 75cc/hr  Foley -none Follow up -Dr Derrell Lollingamirez   LOS: 5 days    Jacinto HalimMichael M Tanija Germani , [facility] 07/29/2019,  11:29 AM Pager: [phone]

## 2019-07-29 NOTE — Progress Notes (Signed)
PT Cancellation Note  Patient Details Name: Rhonda Payne MRN: [id number] DOB: [birth year]   Cancelled Treatment:    Reason Eval/Treat Not Completed: Patient declined. Reports she has been walking every day with her family member in the morning and by herself in the evening. At this time she is trying to get her pills down and waiting on the nurse to let her know what the doctor said about changing her constipation medication to what she uses at home. Reluctantly agreed to PT follow up later today vs another day as she feels she is moving enough.  Willow Ora, PTA, CLT Acute Rehab Services [phone] 07/29/19, 12:03 PM   Willow Ora 07/29/2019, 12:01 PM

## 2019-07-30 ENCOUNTER — Inpatient Hospital Stay (HOSPITAL_COMMUNITY): Payer: Self-pay

## 2019-07-30 LAB — BASIC METABOLIC PANEL
Anion gap: 9 (ref 5–15)
BUN: 10 mg/dL (ref 8–23)
CO2: 21 mmol/L — ABNORMAL LOW (ref 22–32)
Calcium: 9.2 mg/dL (ref 8.9–10.3)
Chloride: 104 mmol/L (ref 98–111)
Creatinine, Ser: 1.24 mg/dL — ABNORMAL HIGH (ref 0.44–1.00)
GFR calc Af Amer: 54 mL/min — ABNORMAL LOW (ref >60)
GFR calc non Af Amer: 47 mL/min — ABNORMAL LOW (ref >60)
Glucose, Bld: 117 mg/dL — ABNORMAL HIGH (ref 70–99)
Potassium: 4.3 mmol/L (ref 3.5–5.1)
Sodium: 134 mmol/L — ABNORMAL LOW (ref 135–145)

## 2019-07-30 LAB — CBC
HCT: 37.2 % (ref 36.0–46.0)
Hemoglobin: 12 g/dL (ref 12.0–15.0)
MCH: 25.6 pg — ABNORMAL LOW (ref 26.0–34.0)
MCHC: 32.3 g/dL (ref 30.0–36.0)
MCV: 79.5 fL — ABNORMAL LOW (ref 80.0–100.0)
Platelets: 263 10*3/uL (ref 150–400)
RBC: 4.68 MIL/uL (ref 3.87–5.11)
RDW: 13.6 % (ref 11.5–15.5)
WBC: 6.8 10*3/uL (ref 4.0–10.5)
nRBC: 0 % (ref 0.0–0.2)

## 2019-07-30 LAB — GLUCOSE, CAPILLARY
Glucose-Capillary: 104 mg/dL — ABNORMAL HIGH (ref 70–99)
Glucose-Capillary: 113 mg/dL — ABNORMAL HIGH (ref 70–99)
Glucose-Capillary: 96 mg/dL (ref 70–99)
Glucose-Capillary: 104 mg/dL — ABNORMAL HIGH (ref 70–99)
Glucose-Capillary: 93 mg/dL (ref 70–99)

## 2019-07-30 LAB — PREALBUMIN: Prealbumin: 20.8 mg/dL (ref 18–38)

## 2019-07-30 MED ORDER — IOPAMIDOL (ISOVUE-300) INJECTION 61%
100.0000 mL | Freq: Once | INTRAVENOUS | Status: AC | PRN
  Administered 2019-07-30: 100 mL via INTRAVENOUS

## 2019-07-30 NOTE — Progress Notes (Signed)
Patient had one episode of brown colored emesis this morning after PRN dose of dilaudid.  Administered PRN Zofran and nausea and vomiting subsided.  Will continue to monitor and report to this to the oncoming nurse.

## 2019-07-30 NOTE — Progress Notes (Addendum)
Patient had 1000 medications scheduled. States she would like to wait and take these medications following her CT scan. She is concerned about nausea with contrast and medications. 1000 medications moved to 1200.

## 2019-07-30 NOTE — Progress Notes (Signed)
Physical Therapy Treatment Patient Details Name: Rhonda RampJeanette Payne MRN: [id number] DOB: Jan 20, 1956 Today's Date: 07/30/2019    History of Present Illness Pt is a 63 y.o. F with significant PMH of HTN, obesity who presented with abdomain pain now s/p laparoscopic incisional hernia repair with mesh x 2 8/11.    PT Comments    Pt functioning at mod I level and is amb in hallway with IV pole several times a day and is taking self to/from the bathroom. Pt however is going for a CT scan as she continues to have nausea and vomiting. Acute PT to monitor patient, 1x/wk, to assess mobility as patient with increasing length of stay however suspect pt will progress well as patient is very motivated to maintain independence and recover quickly.    Follow Up Recommendations  No PT follow up;Supervision - Intermittent     Equipment Recommendations  None recommended by PT    Recommendations for Other Services       Precautions / Restrictions Precautions Precautions: None Restrictions Weight Bearing Restrictions: No    Mobility  Bed Mobility               General bed mobility comments: pt in bathroom upon PT arrival  Transfers Overall transfer level: Modified independent Equipment used: None             General transfer comment: pt with no difficulty  Ambulation/Gait Ambulation/Gait assistance: Modified independent (Device/Increase time) Gait Distance (Feet): 400 Feet Assistive device: None Gait Pattern/deviations: Step-through pattern;Trunk flexed Gait velocity: decreased Gait velocity interpretation: 1.31 - 2.62 ft/sec, indicative of limited community ambulator General Gait Details: pt with mildly flexed trunk and decreased cadence but no instability or LOB   Stairs             Wheelchair Mobility    Modified Rankin (Stroke Patients Only)       Balance Overall balance assessment: Mild deficits observed, not formally tested         Standing balance  support: During functional activity Standing balance-Leahy Scale: Good Standing balance comment: able to toliet self and wash hands without difficulty                            Cognition Arousal/Alertness: Awake/alert Behavior During Therapy: WFL for tasks assessed/performed Overall Cognitive Status: Within Functional Limits for tasks assessed                                        Exercises      General Comments General comments (skin integrity, edema, etc.): VSS      Pertinent Vitals/Pain Pain Assessment: 0-10 Pain Score: 3  Pain Location: abdomen Pain Descriptors / Indicators: Guarding Pain Intervention(s): Monitored during session    Home Living                      Prior Function            PT Goals (current goals can now be found in the care plan section) Progress towards PT goals: Progressing toward goals    Frequency    Min 1X/week      PT Plan Frequency needs to be updated    Co-evaluation              AM-PAC PT "6 Clicks" Mobility   Outcome Measure  Help needed  turning from your back to your side while in a flat bed without using bedrails?: None Help needed moving from lying on your back to sitting on the side of a flat bed without using bedrails?: None Help needed moving to and from a bed to a chair (including a wheelchair)?: None Help needed standing up from a chair using your arms (e.g., wheelchair or bedside chair)?: None Help needed to walk in hospital room?: None Help needed climbing 3-5 steps with a railing? : A Little 6 Click Score: 23    End of Session   Activity Tolerance: Patient tolerated treatment well Patient left: in bed;with call bell/phone within reach(sitting EOB) Nurse Communication: Mobility status PT Visit Diagnosis: Pain Pain - part of body: (abdomen)     Time: 7169-6789 PT Time Calculation (min) (ACUTE ONLY): 13 min  Charges:  $Gait Training: 8-22 mins                      Kittie Plater, PT, DPT Acute Rehabilitation Services Pager #: [phone] Office #: [phone]    Berline Lopes 07/30/2019, 10:11 AM

## 2019-07-30 NOTE — Plan of Care (Signed)
  Problem: Education: Goal: Knowledge of General Education information will improve Description: Including pain rating scale, medication(s)/side effects and non-pharmacologic comfort measures Outcome: Progressing   Problem: Health Behavior/Discharge Planning: Goal: Ability to manage health-related needs will improve Outcome: Progressing   Problem: Clinical Measurements: Goal: Ability to maintain clinical measurements within normal limits will improve Outcome: Progressing Goal: Will remain free from infection Outcome: Progressing Goal: Diagnostic test results will improve Outcome: Progressing Goal: Respiratory complications will improve Outcome: Progressing   Problem: Coping: Goal: Level of anxiety will decrease Outcome: Progressing   Problem: Elimination: Goal: Will not experience complications related to urinary retention Outcome: Progressing   Problem: Pain Managment: Goal: General experience of comfort will improve Outcome: Progressing   Problem: Safety: Goal: Ability to remain free from injury will improve Outcome: Progressing   Problem: Skin Integrity: Goal: Risk for impaired skin integrity will decrease Outcome: Progressing

## 2019-07-30 NOTE — Progress Notes (Signed)
CT scan reviewed which shows recurrence of both hernias.  I suspect that the sutures holding the mesh pulled away causing hernia recurrence.  She is still having no significant bowel function nor she throwing up.  Her pain is controlled but I feel she needs a return trip to the operating room for open repair of these hernias.  We will plan on explaining her old mesh.  She may require retrorectus repair but both hernias were quite small and these may be amendable to primary repair.  Given her morbid obesity she is at high risk for recurrence no matter how her hernias are fixed or technique used.  Risk of recurrence is at least 1 and 4 over the next 5 years.  Given the fact that she still has a partial small bowel obstruction from these there is no other management of this.  She understands this and is agreeable to proceed with repair of hernia tomorrow which will be an open technique.The risk of hernia repair include bleeding,  Infection,   Recurrence of the hernia,  Mesh use, chronic pain,  Organ injury,  Bowel injury,  Bladder injury,   nerve injury with numbness around the incision,  Death,  and worsening of preexisting  medical problems.  The alternatives to surgery have been discussed as well..  Long term expectations of both operative and non operative treatments have been discussed.   The patient agrees to proceed.

## 2019-07-30 NOTE — Progress Notes (Signed)
7 Days Post-Op  Subjective: CC: Had n/v this am. Took milk of mag yesterday and GERD sxs resolved but still without flatus or BM.   Objective: Vital signs in last 24 hours: Temp:  [97.7 F (36.5 C)-99.2 F (37.3 C)] 99.2 F (37.3 C) (08/18 0358) Pulse Rate:  [81-89] 89 (08/18 0358) Resp:  [17] 17 (08/18 0358) BP: (124-137)/(90-91) 124/91 (08/18 0358) SpO2:  [98 %-99 %] 98 % (08/18 0358) Weight:  [315 kg] 114 kg (08/17 1506) Last BM Date: 07/22/19  Intake/Output from previous day: 08/17 0701 - 08/18 0700 In: 2430 [I.V.:2180; IV Piggyback:250] Out: -  Intake/Output this shift: No intake/output data recorded.  PE: Gen: Alert, NAD, pleasant Pulm: Rate andeffort normal VVO:HYWVP, soft, nontender, lap incisions cdi Skin: no rashes noted, warm and dry   Lab Results:  Recent Labs    07/29/19 1247 07/30/19 0708  WBC 7.4 6.8  HGB 12.9 12.0  HCT 39.9 37.2  PLT 279 263   BMET Recent Labs    07/29/19 1247 07/30/19 0708  NA 138 134*  K 4.3 4.3  CL 106 104  CO2 22 21*  GLUCOSE 108* 117*  BUN 12 10  CREATININE 1.29* 1.24*  CALCIUM 9.5 9.2   PT/INR No results for input(s): LABPROT, INR in the last 72 hours. CMP     Component Value Date/Time   NA 134 (L) 07/30/2019 0708   K 4.3 07/30/2019 0708   CL 104 07/30/2019 0708   CO2 21 (L) 07/30/2019 0708   GLUCOSE 117 (H) 07/30/2019 0708   BUN 10 07/30/2019 0708   CREATININE 1.24 (H) 07/30/2019 0708   CALCIUM 9.2 07/30/2019 0708   PROT 7.8 07/23/2019 1109   ALBUMIN 3.8 07/23/2019 1109   AST 21 07/23/2019 1109   ALT 13 07/23/2019 1109   ALKPHOS 47 07/23/2019 1109   BILITOT 1.0 07/23/2019 1109   GFRNONAA 47 (L) 07/30/2019 0708   GFRAA 54 (L) 07/30/2019 0708   Lipase     Component Value Date/Time   LIPASE 23 07/23/2019 1109       Studies/Results: Dg Abd 2 Views  Result Date: 07/28/2019 CLINICAL DATA:  Abdominal distention.  Bloating and discomfort EXAM: ABDOMEN - 2 VIEW COMPARISON:  Abdominal CT  from 5 days ago FINDINGS: Abdominal wall gas on the left correlating with recent surgery. Relatively gasless abdomen with visible fluid-filled bowel loops showing fluid levels. Bowel loops measure up to 3.5 cm, which are likely small bowel. Cholecystectomy clips. Moderate fluid filling of the stomach. Lung bases are clear. No evidence of pneumoperitoneum. IMPRESSION: Limited by relatively gasless abdomen. A few fluid-filled bowel loops are visible in this patient with suspected ileus per notes. A recurrent obstruction cannot be excluded on this study. Electronically Signed   By: Monte Fantasia M.D.   On: 07/28/2019 13:53   Dg Abd Portable 1v  Result Date: 07/30/2019 CLINICAL DATA:  Abdominal pain EXAM: PORTABLE ABDOMEN - 1 VIEW COMPARISON:  July 29, 2019 FINDINGS: There remains paucity of bowel. There is no bowel dilatation or air-fluid level to suggest bowel obstruction. No free air. Surgical clips noted in gallbladder fossa region. IMPRESSION: Paucity of bowel noted. Suspect ileus or a degree of enteritis. Bowel obstruction not felt to be likely. No free air. Electronically Signed   By: Lowella Grip III M.D.   On: 07/30/2019 08:09   Dg Abd Portable 1v  Result Date: 07/29/2019 CLINICAL DATA:  Constipation, nausea Hx of htn, incarcerated hernia, sickle cell EXAM: PORTABLE ABDOMEN -  1 VIEW COMPARISON:  Abdominal x-ray 07/28/2019, 07/25/2019 FINDINGS: Paucity of bowel gas limits evaluation for obstruction but there are no dilated loops identified. No supine evidence for free air. Surgical clips are seen in the right upper quadrant. The visualized lung bases appear clear. No acute osseous abnormality. IMPRESSION: Paucity of bowel gas limits evaluation for obstruction however no dilated loops are identified. Electronically Signed   By: Emmaline KluverNancy  Ballantyne M.D.   On: 07/29/2019 16:40   [place] Ekg Site Rite  Result Date: 07/29/2019 If Site Rite image not attached, placement could not be confirmed due to  current cardiac rhythm.   Anti-infectives: Anti-infectives (From admission, onward)   Start     Dose/Rate Route Frequency Ordered Stop   07/23/19 1615  cefoTEtan (CEFOTAN) 2 g in sodium chloride 0.9 % 100 mL IVPB     2 g 200 mL/hr over 30 Minutes Intravenous On call to O.R. 07/23/19 1501 07/23/19 1820       Assessment/Plan HTN Anemia  CKD - Cr trending down, check labs   Incarcerated incisional hernia POD 7 - Laparoscopic incarcerated incisional hernia repair with mesh x2 - Dr. Derrell Lollingamirez, 07/23/2019 - Appears to still have an ongoing ileus, now 7d out - AF WBC normal; abdominal film didn't show clear obstruction but given interval from surgery will plan for CT A/P today with PO contrast - May need PICC/TPN as well - will await CT results  ID -cefotetanperiop VTE -SCDs, lovenox FEN -IVF@ 75cc/hr  Foley -none Follow up -Dr Derrell Lollingamirez   LOS: 6 days   Stephanie Couphristopher M. Cliffton AstersWhite, M.D. [facility]

## 2019-07-30 NOTE — Progress Notes (Signed)
[place] CONSULT NOTE   Pharmacy Consult for TPN Indication: Prolonged Ileus  Patient Measurements: Height: 5' 5.51" (166.4 cm) Weight: 251 lb 5.2 oz (114 kg) IBW/kg (Calculated) : 58.18 TPN AdjBW (KG): 71.4 Body mass index is 41.17 kg/m. Usual Weight: ~ as recorded this admission  Assessment: 60 YOF with significant PMH of HTN, obesity, presented with incarcerated incisional hernia s/p repair with mesh on 8/11, with post-op Ileus ongoing.  Continues with n/v, no flatus or BM, pharmacy consulted for TPN.   GI: Some emesis this AM, may have NGT today for decompression.  No obstruction on films, for CT today to eval for TPN start and PICC placement Prealbumin 20.8 on 8/18 Endo: No hx DM - on SSI moderate  Insulin requirements in the past 24 hours: 0 units Lytes: Na 134, others wnl Renal: SCr 1.24, no UOP recorded - D5W 1/2NS w/20KCl @100  Pulm:RA Cards: VSS Hepatobil: LFTs wnl on 8/11, TPN labs for AM Neuro: ID: Af, wbc wnl  TPN Access: Pt refused PICC last night, surgery to eval CT today for TPN/PICC need TPN start date: Pending need Nutritional Goals (Awaiting RD recs): KCal: 1750-2400 Protein: 85-105 Fluid: >2L  Goal TPN rate is 65 ml/hr  Current Nutrition: NPO  Plan:  Will await CT results and surgery recs for initiation of TPN/PICC plaement Continue D5W 1/2NS w/20KCl @100  TPN labs in AM  Bertis Ruddy, PharmD Clinical Pharmacist Please check AMION for all [place] numbers 07/30/2019 11:07 AM

## 2019-07-30 NOTE — Progress Notes (Signed)
Initial Nutrition Assessment  RD working remotely.  DOCUMENTATION CODES:   Morbid obesity  INTERVENTION:   -RD will follow for diet advancement and supplement as appropriate -If prolonged NPO/ clear liquid diet status is anticipated, consider initiation of nutrition support  NUTRITION DIAGNOSIS:   Inadequate oral intake related to altered GI function as evidenced by NPO status.  GOAL:   Patient will meet greater than or equal to 90% of their needs  MONITOR:   Diet advancement, Labs, Weight trends, Skin, I & O's  REASON FOR ASSESSMENT:   Consult New TPN/TNA  ASSESSMENT:   Rhonda Payne is a 63yo female PMH HTN and obesity who presented to [facility] earlier today complaining of abdominal pain. States that the pain started yesterday morning. It is diffuse but most severe periumbilical and left lower quadrant. Worse with walking and palpation. Somewhat better with rest. Initially thought she was constipated and took some milk of magnesia. She had a BM yesterday but this did not improve her pain. Denies nausea, vomiting, fever, chills, dysuria, or back pain. Passing some flatus this morning. She has had nothing to eat/drink today. States that she does not have an appetite.  Pt admitted with incarcerated lt anterior spigelian type hernia.   8/11- s/p Procedure(s): Laparoscopic incisional hernia repair with mesh x2  8/12- advanced to clear liquid diet 8/13- NPO 8/17- pt refused PICC placement  Reviewed I/O's: +2.4 L x 24 hours and +10.8 L since admission  Per general surgery notes, pt with abdominal distention and possible ileus. Abdominal films did not reveal clear obstruction. Plan for CT of abdomen and pelvis today. Depending on result, pt may require TPN.   Pt has been mainly NPO/clear liquids since hospitalization and has not received adequate nutrition during stay.   Medications reviewed and include dextrose 5% and 0.45% NaCl with KCl 20 mEq/L infusion @ 100 ml/hr.   Labs  reviewed: Na: 134, CBGS: 96-113 (inpatient orders for glycemic control are 0-15 units insulin aspart every 4 hours).   Diet Order:   Diet Order            Diet NPO time specified Except for: Ice Chips, Sips with Meds  Diet effective now              EDUCATION NEEDS:   Not appropriate for education at this time  Skin:  Skin Assessment: Skin Integrity Issues: Skin Integrity Issues:: Incisions Incisions: closed abdomen  Last BM:  07/22/19  Height:   Ht Readings from Last 1 Encounters:  07/23/19 5' 5.51" (1.664 m)    Weight:   Wt Readings from Last 1 Encounters:  07/29/19 114 kg    Ideal Body Weight:  56.8 kg  BMI:  Body mass index is 41.17 kg/m.  Estimated Nutritional Needs:   Kcal:  1800-2000  Protein:  110-125 grams  Fluid:  > 1.8 L    Challen Spainhour A. Jimmye Norman, RD, LDN, [place] Registered Dietitian II Certified Diabetes Care and Education Specialist Pager: [phone] After hours Pager: [phone]

## 2019-07-31 ENCOUNTER — Inpatient Hospital Stay (HOSPITAL_COMMUNITY): Payer: Self-pay | Admitting: Anesthesiology

## 2019-07-31 ENCOUNTER — Encounter (HOSPITAL_COMMUNITY): Payer: Self-pay | Admitting: Orthopedic Surgery

## 2019-07-31 ENCOUNTER — Encounter (HOSPITAL_COMMUNITY): Admission: EM | Disposition: A | Discharge status: Home Health | Payer: Self-pay | Source: Home / Self Care

## 2019-07-31 HISTORY — PX: INCISIONAL HERNIA REPAIR: SHX193

## 2019-07-31 LAB — COMPREHENSIVE METABOLIC PANEL
ALT: 105 U/L — ABNORMAL HIGH (ref 0–44)
AST: 76 U/L — ABNORMAL HIGH (ref 15–41)
Albumin: 3 g/dL — ABNORMAL LOW (ref 3.5–5.0)
Alkaline Phosphatase: 52 U/L (ref 38–126)
Anion gap: 8 (ref 5–15)
BUN: 8 mg/dL (ref 8–23)
CO2: 22 mmol/L (ref 22–32)
Calcium: 8.9 mg/dL (ref 8.9–10.3)
Chloride: 106 mmol/L (ref 98–111)
Creatinine, Ser: 1.14 mg/dL — ABNORMAL HIGH (ref 0.44–1.00)
GFR calc Af Amer: >60 mL/min (ref >60)
GFR calc non Af Amer: 52 mL/min — ABNORMAL LOW (ref >60)
Glucose, Bld: 110 mg/dL — ABNORMAL HIGH (ref 70–99)
Potassium: 3.9 mmol/L (ref 3.5–5.1)
Sodium: 136 mmol/L (ref 135–145)
Total Bilirubin: 1.3 mg/dL — ABNORMAL HIGH (ref 0.3–1.2)
Total Protein: 6.7 g/dL (ref 6.5–8.1)

## 2019-07-31 LAB — GLUCOSE, CAPILLARY
Glucose-Capillary: 83 mg/dL (ref 70–99)
Glucose-Capillary: 89 mg/dL (ref 70–99)
Glucose-Capillary: 98 mg/dL (ref 70–99)

## 2019-07-31 LAB — CBC
HCT: 35.9 % — ABNORMAL LOW (ref 36.0–46.0)
Hemoglobin: 11.7 g/dL — ABNORMAL LOW (ref 12.0–15.0)
MCH: 25.6 pg — ABNORMAL LOW (ref 26.0–34.0)
MCHC: 32.6 g/dL (ref 30.0–36.0)
MCV: 78.6 fL — ABNORMAL LOW (ref 80.0–100.0)
Platelets: 261 10*3/uL (ref 150–400)
RBC: 4.57 MIL/uL (ref 3.87–5.11)
RDW: 13.5 % (ref 11.5–15.5)
WBC: 5.8 10*3/uL (ref 4.0–10.5)
nRBC: 0 % (ref 0.0–0.2)

## 2019-07-31 LAB — MAGNESIUM: Magnesium: 1.7 mg/dL (ref 1.7–2.4)

## 2019-07-31 LAB — DIFFERENTIAL
Abs Immature Granulocytes: 0.03 10*3/uL (ref 0.00–0.07)
Basophils Absolute: 0 10*3/uL (ref 0.0–0.1)
Basophils Relative: 1 %
Eosinophils Absolute: 0.3 10*3/uL (ref 0.0–0.5)
Eosinophils Relative: 5 %
Immature Granulocytes: 1 %
Lymphocytes Relative: 23 %
Lymphs Abs: 1.4 10*3/uL (ref 0.7–4.0)
Monocytes Absolute: 0.5 10*3/uL (ref 0.1–1.0)
Monocytes Relative: 8 %
Neutro Abs: 3.6 10*3/uL (ref 1.7–7.7)
Neutrophils Relative %: 62 %

## 2019-07-31 LAB — PHOSPHORUS: Phosphorus: 4 mg/dL (ref 2.5–4.6)

## 2019-07-31 LAB — SURGICAL PCR SCREEN
MRSA, PCR: NEGATIVE
Staphylococcus aureus: NEGATIVE

## 2019-07-31 LAB — TRIGLYCERIDES: Triglycerides: 82 mg/dL (ref <150)

## 2019-07-31 SURGERY — REPAIR, HERNIA, INCISIONAL
Anesthesia: General | Site: Abdomen

## 2019-07-31 MED ORDER — FENTANYL CITRATE (PF) 100 MCG/2ML IJ SOLN
INTRAMUSCULAR | Status: DC | PRN
  Administered 2019-07-31 (×5): 50 ug via INTRAVENOUS

## 2019-07-31 MED ORDER — DEXAMETHASONE SODIUM PHOSPHATE 10 MG/ML IJ SOLN
INTRAMUSCULAR | Status: AC
  Filled 2019-07-31: qty 1

## 2019-07-31 MED ORDER — LACTATED RINGERS IV SOLN
INTRAVENOUS | Status: DC
  Administered 2019-07-31 (×2): via INTRAVENOUS

## 2019-07-31 MED ORDER — FENTANYL CITRATE (PF) 100 MCG/2ML IJ SOLN
INTRAMUSCULAR | Status: AC
  Filled 2019-07-31: qty 2

## 2019-07-31 MED ORDER — MIDAZOLAM HCL 5 MG/5ML IJ SOLN
INTRAMUSCULAR | Status: DC | PRN
  Administered 2019-07-31: 2 mg via INTRAVENOUS

## 2019-07-31 MED ORDER — EPHEDRINE 5 MG/ML INJ
INTRAVENOUS | Status: AC
  Filled 2019-07-31: qty 10

## 2019-07-31 MED ORDER — ROCURONIUM BROMIDE 10 MG/ML (PF) SYRINGE
PREFILLED_SYRINGE | INTRAVENOUS | Status: DC | PRN
  Administered 2019-07-31: 20 mg via INTRAVENOUS
  Administered 2019-07-31: 50 mg via INTRAVENOUS

## 2019-07-31 MED ORDER — MIDAZOLAM HCL 2 MG/2ML IJ SOLN
INTRAMUSCULAR | Status: AC
  Filled 2019-07-31: qty 2

## 2019-07-31 MED ORDER — ROCURONIUM BROMIDE 10 MG/ML (PF) SYRINGE
PREFILLED_SYRINGE | INTRAVENOUS | Status: AC
  Filled 2019-07-31: qty 10

## 2019-07-31 MED ORDER — PROMETHAZINE HCL 25 MG/ML IJ SOLN: 6.2500 mg | INTRAMUSCULAR | Status: DC | PRN

## 2019-07-31 MED ORDER — HYDROMORPHONE 1 MG/ML IV SOLN
INTRAVENOUS | Status: DC
  Administered 2019-07-31: 30 mg via INTRAVENOUS
  Administered 2019-08-01: 0.3 mg via INTRAVENOUS
  Administered 2019-08-01 (×2): 0.6 mg via INTRAVENOUS
  Administered 2019-08-01: 1.2 mg via INTRAVENOUS
  Administered 2019-08-02: 0.6 mg via INTRAVENOUS
  Administered 2019-08-02: 0.3 mg via INTRAVENOUS
  Administered 2019-08-02: 0.6 mg via INTRAVENOUS
  Filled 2019-07-31: qty 30

## 2019-07-31 MED ORDER — DEXAMETHASONE SODIUM PHOSPHATE 4 MG/ML IJ SOLN
INTRAMUSCULAR | Status: DC | PRN
  Administered 2019-07-31: 10 mg via INTRAVENOUS

## 2019-07-31 MED ORDER — BUPIVACAINE-EPINEPHRINE 0.25% -1:200000 IJ SOLN
INTRAMUSCULAR | Status: AC
  Filled 2019-07-31: qty 1

## 2019-07-31 MED ORDER — SODIUM CHLORIDE 0.9% FLUSH: 9.0000 mL | INTRAVENOUS | Status: DC | PRN

## 2019-07-31 MED ORDER — LIDOCAINE 2% (20 MG/ML) 5 ML SYRINGE
INTRAMUSCULAR | Status: AC
  Filled 2019-07-31: qty 5

## 2019-07-31 MED ORDER — PROPOFOL 10 MG/ML IV BOLUS
INTRAVENOUS | Status: AC
  Filled 2019-07-31: qty 20

## 2019-07-31 MED ORDER — FENTANYL CITRATE (PF) 100 MCG/2ML IJ SOLN
25.0000 ug | INTRAMUSCULAR | Status: DC | PRN
  Administered 2019-07-31: 25 ug via INTRAVENOUS
  Administered 2019-07-31: 50 ug via INTRAVENOUS
  Administered 2019-07-31: 25 ug via INTRAVENOUS

## 2019-07-31 MED ORDER — ESMOLOL HCL 100 MG/10ML IV SOLN
INTRAVENOUS | Status: DC | PRN
  Administered 2019-07-31 (×2): 20 mg via INTRAVENOUS

## 2019-07-31 MED ORDER — LIDOCAINE 2% (20 MG/ML) 5 ML SYRINGE
INTRAMUSCULAR | Status: DC | PRN
  Administered 2019-07-31: 100 mg via INTRAVENOUS

## 2019-07-31 MED ORDER — NALOXONE HCL 0.4 MG/ML IJ SOLN: 0.4000 mg | INTRAMUSCULAR | Status: DC | PRN

## 2019-07-31 MED ORDER — 0.9 % SODIUM CHLORIDE (POUR BTL) OPTIME
TOPICAL | Status: DC | PRN
  Administered 2019-07-31: 1000 mL

## 2019-07-31 MED ORDER — ACETAMINOPHEN 10 MG/ML IV SOLN: 1000.0000 mg | Freq: Once | INTRAVENOUS | Status: DC | PRN

## 2019-07-31 MED ORDER — OXYCODONE HCL 5 MG PO TABS: 5.0000 mg | ORAL_TABLET | Freq: Once | ORAL | Status: DC | PRN

## 2019-07-31 MED ORDER — PHENYLEPHRINE HCL (PRESSORS) 10 MG/ML IV SOLN
INTRAVENOUS | Status: DC | PRN
  Administered 2019-07-31: 80 mg via INTRAVENOUS
  Administered 2019-07-31: 120 mg via INTRAVENOUS
  Administered 2019-07-31: 80 mg via INTRAVENOUS

## 2019-07-31 MED ORDER — ACETAMINOPHEN 10 MG/ML IV SOLN
1000.0000 mg | Freq: Four times a day (QID) | INTRAVENOUS | Status: AC
  Administered 2019-07-31 – 2019-08-01 (×4): 1000 mg via INTRAVENOUS
  Filled 2019-07-31 (×4): qty 100

## 2019-07-31 MED ORDER — SUCCINYLCHOLINE CHLORIDE 20 MG/ML IJ SOLN
INTRAMUSCULAR | Status: DC | PRN
  Administered 2019-07-31: 140 mg via INTRAVENOUS

## 2019-07-31 MED ORDER — ONDANSETRON HCL 4 MG/2ML IJ SOLN
INTRAMUSCULAR | Status: AC
  Filled 2019-07-31: qty 2

## 2019-07-31 MED ORDER — OXYCODONE HCL 5 MG/5ML PO SOLN: 5.0000 mg | Freq: Once | ORAL | Status: DC | PRN

## 2019-07-31 MED ORDER — PROPOFOL 10 MG/ML IV BOLUS
INTRAVENOUS | Status: DC | PRN
  Administered 2019-07-31: 180 mg via INTRAVENOUS

## 2019-07-31 MED ORDER — FENTANYL CITRATE (PF) 250 MCG/5ML IJ SOLN
INTRAMUSCULAR | Status: AC
  Filled 2019-07-31: qty 5

## 2019-07-31 MED ORDER — SODIUM CHLORIDE 0.9 % IV SOLN
INTRAVENOUS | Status: DC | PRN
  Administered 2019-07-31: 50 ug/min via INTRAVENOUS

## 2019-07-31 MED ORDER — SUGAMMADEX SODIUM 200 MG/2ML IV SOLN
INTRAVENOUS | Status: DC | PRN
  Administered 2019-07-31: 200 mg via INTRAVENOUS

## 2019-07-31 MED ORDER — PROMETHAZINE HCL 25 MG/ML IJ SOLN
INTRAMUSCULAR | Status: DC | PRN
  Administered 2019-07-31: 6.25 mg via INTRAVENOUS

## 2019-07-31 MED ORDER — ONDANSETRON HCL 4 MG/2ML IJ SOLN
INTRAMUSCULAR | Status: DC | PRN
  Administered 2019-07-31: 4 mg via INTRAVENOUS

## 2019-07-31 MED ORDER — CEFAZOLIN SODIUM-DEXTROSE 2-3 GM-%(50ML) IV SOLR
INTRAVENOUS | Status: DC | PRN
  Administered 2019-07-31: 2 g via INTRAVENOUS

## 2019-07-31 SURGICAL SUPPLY — 44 items
ADH SKN CLS APL DERMABOND .7 (GAUZE/BANDAGES/DRESSINGS) ×2
APL PRP STRL LF DISP 70% ISPRP (MISCELLANEOUS) ×1
BINDER ABDOMINAL 12 ML 46-62 (SOFTGOODS) IMPLANT
CANISTER SUCT 3000ML PPV (MISCELLANEOUS) ×3 IMPLANT
CHLORAPREP W/TINT 26 (MISCELLANEOUS) ×3 IMPLANT
COVER SURGICAL LIGHT HANDLE (MISCELLANEOUS) ×3 IMPLANT
COVER WAND RF STERILE (DRAPES) ×3 IMPLANT
DERMABOND ADVANCED (GAUZE/BANDAGES/DRESSINGS) ×4
DERMABOND ADVANCED .7 DNX12 (GAUZE/BANDAGES/DRESSINGS) IMPLANT
DRAIN CHANNEL 19F RND (DRAIN) IMPLANT
DRAPE LAPAROSCOPIC ABDOMINAL (DRAPES) ×3 IMPLANT
DRSG OPSITE POSTOP 4X8 (GAUZE/BANDAGES/DRESSINGS) ×2 IMPLANT
DRSG PAD ABDOMINAL 8X10 ST (GAUZE/BANDAGES/DRESSINGS) IMPLANT
ELECT CAUTERY BLADE 6.4 (BLADE) ×3 IMPLANT
ELECT REM PT RETURN 9FT ADLT (ELECTROSURGICAL) ×3
ELECTRODE REM PT RTRN 9FT ADLT (ELECTROSURGICAL) ×1 IMPLANT
EVACUATOR SILICONE 100CC (DRAIN) IMPLANT
GAUZE SPONGE 4X4 12PLY STRL (GAUZE/BANDAGES/DRESSINGS) IMPLANT
GLOVE BIO SURGEON STRL SZ8 (GLOVE) ×5 IMPLANT
GLOVE BIOGEL PI IND STRL 8 (GLOVE) ×1 IMPLANT
GLOVE BIOGEL PI INDICATOR 8 (GLOVE) ×4
GOWN STRL REUS W/ TWL LRG LVL3 (GOWN DISPOSABLE) ×1 IMPLANT
GOWN STRL REUS W/ TWL XL LVL3 (GOWN DISPOSABLE) ×1 IMPLANT
GOWN STRL REUS W/TWL LRG LVL3 (GOWN DISPOSABLE) ×3
GOWN STRL REUS W/TWL XL LVL3 (GOWN DISPOSABLE) ×3
KIT BASIN OR (CUSTOM PROCEDURE TRAY) ×3 IMPLANT
KIT TURNOVER KIT B (KITS) ×3 IMPLANT
NDL HYPO 25GX1X1/2 BEV (NEEDLE) IMPLANT
NEEDLE HYPO 25GX1X1/2 BEV (NEEDLE) IMPLANT
NS IRRIG 1000ML POUR BTL (IV SOLUTION) ×3 IMPLANT
PACK GENERAL/GYN (CUSTOM PROCEDURE TRAY) ×3 IMPLANT
PAD ARMBOARD 7.5X6 YLW CONV (MISCELLANEOUS) ×3 IMPLANT
PENCIL SMOKE EVACUATOR (MISCELLANEOUS) ×3 IMPLANT
RETAINER VISCERA MED (MISCELLANEOUS) ×2 IMPLANT
STAPLER VISISTAT 35W (STAPLE) IMPLANT
SUT ETHILON 2 0 FS 18 (SUTURE) IMPLANT
SUT MON AB 4-0 PC3 18 (SUTURE) IMPLANT
SUT NOVA 1 T20/GS 25DT (SUTURE) ×4 IMPLANT
SUT NOVA NAB DX-16 0-1 5-0 T12 (SUTURE) ×6 IMPLANT
SUT PDS AB 1 CTX 36 (SUTURE) ×6 IMPLANT
SYR CONTROL 10ML LL (SYRINGE) IMPLANT
TOWEL GREEN STERILE (TOWEL DISPOSABLE) ×3 IMPLANT
TOWEL GREEN STERILE FF (TOWEL DISPOSABLE) ×3 IMPLANT
TRAY FOLEY MTR SLVR 16FR STAT (SET/KITS/TRAYS/PACK) ×2 IMPLANT

## 2019-07-31 NOTE — TOC Progression Note (Signed)
Transition of Care [facility]) - Progression Note    Patient Details  Name: Rhonda Payne MRN: [id number] Date of Birth: [birth year]  Transition of Care [facility] CM/SW Contact  Jacalyn Lefevre Edson Snowball, RN Phone Number: 07/31/2019, 11:41 AM  Clinical Narrative:    Patient from home with spouse.   PCP listed as Dr Kevan Ny but she has not seen Dr DEan for 2 years because of no insurance, she would like to establish care with [facility]. Will schedule appointment closer to discharge.  Patient is scheduled for surgery today. Patient will need MATCH at DC   Expected Discharge Plan: [place] Barriers to Discharge: Continued Medical Work up  Expected Discharge Plan and Services Expected Discharge Plan: [place]   Discharge Planning Services: CM Consult, [facility], [facility] Program, Medication Assistance Post Acute Care Choice: [place] arrangements for the past 2 months: Single Family Home                                       Social Determinants of Health (SDOH) Interventions    Readmission Risk Interventions No flowsheet data found.

## 2019-07-31 NOTE — Anesthesia Procedure Notes (Signed)
Procedure Name: Intubation Date/Time: 07/31/2019 1:48 PM Performed by: Clearnce Sorrel, CRNA Pre-anesthesia Checklist: Patient identified, Emergency Drugs available, Suction available, Patient being monitored and Timeout performed Patient Re-evaluated:Patient Re-evaluated prior to induction Oxygen Delivery Method: Circle system utilized Preoxygenation: Pre-oxygenation with 100% oxygen Induction Type: IV induction, Rapid sequence and Cricoid Pressure applied Laryngoscope Size: Glidescope and 3 Grade View: Grade I Tube type: Oral Tube size: 7.0 mm Number of attempts: 1 Airway Equipment and Method: Stylet Placement Confirmation: ETT inserted through vocal cords under direct vision,  positive ETCO2 and breath sounds checked- equal and bilateral Secured at: 22 cm Tube secured with: Tape Dental Injury: Teeth and Oropharynx as per pre-operative assessment

## 2019-07-31 NOTE — Progress Notes (Signed)
8 Days Post-Op    CC: Abdominal pain  Subjective: No BM again today.  On exam her abdomen is remarkably nontender with all things considered.  She is ambulating in the room quite well.  Objective: Vital signs in last 24 hours: Temp:  [97.7 F (36.5 C)-98.9 F (37.2 C)] 98.9 F (37.2 C) (08/19 0446) Pulse Rate:  [65-86] 65 (08/19 0446) Resp:  [16-19] 17 (08/19 0446) BP: (88-128)/(64-89) 123/89 (08/19 1025) SpO2:  [94 %-99 %] 94 % (08/19 0446) Last BM Date: 07/22/19 N.p.o. Voided x5 No IV fluids recorded No BM Afebrile vital signs are stable and blood pressure down to 88/64. Labs shows creatinine stable 1.14.  AST 76, ALT 105, total bilirubin 1.3, pre-albumin 20.8  Intake/Output from previous day: No intake/output data recorded. Intake/Output this shift: No intake/output data recorded.  General appearance: alert, cooperative and no distress Resp: clear to auscultation bilaterally GI: Soft, port sites look fine.  No palpable masses or tenderness on exam.  No BM.  Lab Results:  Recent Labs    07/30/19 0708 07/31/19 0526  WBC 6.8 5.8  HGB 12.0 11.7*  HCT 37.2 35.9*  PLT 263 261    BMET Recent Labs    07/30/19 0708 07/31/19 0526  NA 134* 136  K 4.3 3.9  CL 104 106  CO2 21* 22  GLUCOSE 117* 110*  BUN 10 8  CREATININE 1.24* 1.14*  CALCIUM 9.2 8.9   PT/INR No results for input(s): LABPROT, INR in the last 72 hours.  Recent Labs  Lab 07/31/19 0526  AST 76*  ALT 105*  ALKPHOS 52  BILITOT 1.3*  PROT 6.7  ALBUMIN 3.0*     Lipase     Component Value Date/Time   LIPASE 23 07/23/2019 1109     Medications: . calcium carbonate  1 tablet Oral BID  . docusate sodium  100 mg Oral BID  . enoxaparin (LOVENOX) injection  40 mg Subcutaneous Q24H  . gabapentin  300 mg Oral BID  . hydrochlorothiazide  25 mg Oral Daily  . lisinopril  20 mg Oral Daily  . magnesium hydroxide  30 mL Oral Daily  . pantoprazole (PROTONIX) IV  40 mg Intravenous QHS  .  polyethylene glycol  17 g Oral Daily   . dextrose 5 % and 0.45 % NaCl with KCl 20 mEq/L 100 mL/hr at 07/31/19 0850  . lactated ringers    . methocarbamol (ROBAXIN) IV 500 mg (07/31/19 8469)    Assessment/Plan HTN Anemia  CKD - Creatinine 1.11>>1.24>>1.14(8/19)  - prealbumin 20.8  Morbid obesity BMI 41.1  Incarcerated incisional hernia  Laparoscopic incarcerated incisional hernia repair with mesh x2 - Dr. Rosendo Gros, 07/23/2019, POD# 8 - Appears to still have an ongoing ileus, now 7d out  - repeat CT 8/18:  reoccurrence of both hernias  -  OR 07/31/19 Dr. Brantley Stage  ID -cefotetanperiop VTE -SCDs, lovenox on Hold (last dose 1351 hrs 07/30/19) FEN -IVF@ 100cc/hr/NPO Foley -none Follow up -Dr Rosendo Gros   Plan: For surgery later today.  LOS: 7 days    Hakeem Frazzini 07/31/2019 [phone]

## 2019-07-31 NOTE — Progress Notes (Signed)
Pre-op report called and given to Susan, RN in Short Stay. All questions answered to satisfaction. OR personnel to transport pt.  

## 2019-07-31 NOTE — Progress Notes (Signed)
Paged on call MD about patient's B/P of 88/64 and patient is asymptomatic.  Received a call back with no new orders.  Will continue to monitor patient.

## 2019-07-31 NOTE — Anesthesia Preprocedure Evaluation (Addendum)
Anesthesia Evaluation  Patient identified by MRN, date of birth, ID band Patient awake    Reviewed: Allergy & Precautions, NPO status , Patient's Chart, lab work & pertinent test results  History of Anesthesia Complications Negative for: history of anesthetic complications  Airway Mallampati: II  TM Distance: >3 FB Neck ROM: Full    Dental  (+) Edentulous Upper, Edentulous Lower   Pulmonary neg pulmonary ROS,    Pulmonary exam normal        Cardiovascular hypertension, Pt. on medications Normal cardiovascular exam     Neuro/Psych negative neurological ROS     GI/Hepatic Neg liver ROS, Incarcerated hernia   Endo/Other  Morbid obesity  Renal/GU Renal InsufficiencyRenal disease     Musculoskeletal negative musculoskeletal ROS (+)   Abdominal   Peds  Hematology  (+) anemia , Sickle cell trait, Hgb 11.7   Anesthesia Other Findings Day of surgery medications reviewed with the patient.  Reproductive/Obstetrics                            Anesthesia Physical Anesthesia Plan  ASA: III  Anesthesia Plan: General   Post-op Pain Management:    Induction: Intravenous, Rapid sequence and Cricoid pressure planned  PONV Risk Score and Plan: 4 or greater and Treatment may vary due to age or medical condition, Ondansetron, Dexamethasone and Midazolam  Airway Management Planned: Oral ETT and Video Laryngoscope Planned  Additional Equipment: None  Intra-op Plan:   Post-operative Plan: Extubation in OR  Informed Consent: I have reviewed the patients History and Physical, chart, labs and discussed the procedure including the risks, benefits and alternatives for the proposed anesthesia with the patient or authorized representative who has indicated his/her understanding and acceptance.     Dental advisory given  Plan Discussed with: CRNA  Anesthesia Plan Comments:        Anesthesia Quick  Evaluation

## 2019-07-31 NOTE — Op Note (Signed)
Preop diagnosis: History of laparoscopic incisional herniorrhaphy with mesh with subsequent recurrent hernia  Postop diagnosis: Port site hernia with small bowel obstruction  Procedure: Exploratory laparotomy with removal of mesh and repair of port site hernia primarily and primary closure of the abdominal wall  Surgeon: Thomas Cornett, MD  Assistant: Dr. Ramirez MD  Anesthesia: General  EBL: 250 cc  IV fluids: Per anesthesia record  Indications for procedure: The patient presents 1 week after repair of laparoscopic incisional hernia with mesh.  She is developed what looked like initially an ileus and CT scan showed recurrent abdominal wall hernia with bowel obstruction.  She has presented to the operating for repair.  Risk, benefits and potential repair options were discussed with the patient as well as long-term expectations and potential wound issues as well.  She had a bowel obstruction could not eat therefore there is no medical management of her condition.The procedure has been discussed with the patient.  Alternative therapies have been discussed with the patient.  Operative risks include bleeding,  Infection,  Organ injury,  Nerve injury,  Blood vessel injury,  DVT,  Pulmonary embolism,  Death,  And possible reoperation.  Medical management risks include worsening of present situation.  The success of the procedure is 50 -90 % at treating patients symptoms.  The patient understands and agrees to proceed.   Description of procedure: The patient was met in the holding and questions were answered.  She was taken back the operative room.  She is placed supine upon the OR table.  After induction of general esthesia she was prepped and draped in sterile fashion she received 2 g of Ancef.  CT scan is available for review.  A midline incision around the umbilicus was created.  Dissection was carried down and we entered the hernia sac.  Seroma was evacuated.  We continued down to the fascial  closure and this was opened.  The mesh was encountered.  I opened the fascia cranially and caudally from this.  I got to where the tacks are hold the mesh in place remove the tacks as well as the mesh thus exposing the hernia defect.  There is proximal small bowel dilation noted.  I was unable to reach my hand and find the obstruction which was a port site hernia from the port enter left upper quadrant.  I was able to manually reduce the bowel out of that.  The bowel was examined and there is no signs of ischemia but there was sign of obstruction.  I then ran the remainder the small bowel and this was normal.  The colon was grossly normal.  We then closed the left upper quadrant port site hernia with interrupted #1 Novafil sutures.  We made sure we had a good approximation of all the abdominal wall layers to prevent any further hernia recurrence and make sure this was done which was.  The tissues were quite friable but the sutures held.  Irrigation was used and suctioned out.  Small bowel was reexamined and found to be normal.  We then opted to close her primarily with #1 PDS suture in a running fashion.  The subcu tissues irrigated.  Then closed with staples.  Honeycomb dressings applied to both.  All final counts were found to be correct.  Patient was awoke extubated taken to recovery in satisfactory condition. 

## 2019-07-31 NOTE — Transfer of Care (Signed)
Immediate Anesthesia Transfer of Care Note  Patient: Rhonda Payne  Procedure(s) Performed: OPEN INCISIONAL HERNIA REPAIR WITH MESH (N/A Abdomen)  Patient Location: PACU  Anesthesia Type:General  Level of Consciousness: awake, alert  and oriented  Airway & Oxygen Therapy: Patient Spontanous Breathing and Patient connected to face mask oxygen  Post-op Assessment: Report given to RN and Post -op Vital signs reviewed and stable  Post vital signs: Reviewed and stable  Last Vitals:  Vitals Value Taken Time  BP 131/91 07/31/19 1536  Temp    Pulse 99 07/31/19 1538  Resp 19 07/31/19 1538  SpO2 95 % 07/31/19 1538  Vitals shown include unvalidated device data.  Last Pain:  Vitals:   07/31/19 0856  TempSrc:   PainSc: 0-No pain      Patients Stated Pain Goal: 2 (70/01/74 9449)  Complications: No apparent anesthesia complications

## 2019-07-31 NOTE — Progress Notes (Signed)
Pt returned to room 6N06 after surgery. Received report from Mickel Baas, [place] in PACU. See reassessment. Will continue to monitor.

## 2019-07-31 NOTE — Plan of Care (Signed)
  Problem: Education: Goal: Knowledge of General Education information will improve Description: Including pain rating scale, medication(s)/side effects and non-pharmacologic comfort measures Outcome: Progressing   Problem: Health Behavior/Discharge Planning: Goal: Ability to manage health-related needs will improve Outcome: Progressing   Problem: Clinical Measurements: Goal: Ability to maintain clinical measurements within normal limits will improve Outcome: Progressing Goal: Will remain free from infection Outcome: Progressing Goal: Diagnostic test results will improve Outcome: Progressing Goal: Respiratory complications will improve Outcome: Progressing Goal: Cardiovascular complication will be avoided Outcome: Progressing   Problem: Coping: Goal: Level of anxiety will decrease Outcome: Progressing   Problem: Elimination: Goal: Will not experience complications related to urinary retention Outcome: Progressing   Problem: Safety: Goal: Ability to remain free from injury will improve Outcome: Progressing   Problem: Skin Integrity: Goal: Risk for impaired skin integrity will decrease Outcome: Progressing

## 2019-08-01 ENCOUNTER — Encounter (HOSPITAL_COMMUNITY): Payer: Self-pay | Admitting: Surgery

## 2019-08-01 MED ORDER — KCL IN DEXTROSE-NACL 20-5-0.45 MEQ/L-%-% IV SOLN
INTRAVENOUS | Status: DC
  Administered 2019-08-01 – 2019-08-03 (×4): via INTRAVENOUS
  Filled 2019-08-01 (×3): qty 1000

## 2019-08-01 NOTE — Progress Notes (Signed)
Subjective No acute events. Feeling better since OR. Denies flatus/BM yet. Has not been out of bed  Objective: Vital signs in last 24 hours: Temp:  [98 F (36.7 C)-100 F (37.8 C)] 98.7 F (37.1 C) (08/20 0541) Pulse Rate:  [90-110] 90 (08/20 0541) Resp:  [12-21] 12 (08/20 0744) BP: (89-148)/(65-91) 116/86 (08/20 0541) SpO2:  [93 %-98 %] 96 % (08/20 0744) Weight:  [628 kg] 114 kg (08/19 1242) Last BM Date: 07/22/19  Intake/Output from previous day: 08/19 0701 - 08/20 0700 In: 2151.5 [I.V.:2051.5; IV Piggyback:100] Out: 1750 [Urine:700; Blood:50] Intake/Output this shift: No intake/output data recorded.  Gen: NAD, comfortable CV: RRR Pulm: Normal work of breathing Abd: Obese, soft, NT. Incisions dressed/dry. Ext: SCDs in place  Lab Results: CBC  Recent Labs    07/30/19 0708 07/31/19 0526  WBC 6.8 5.8  HGB 12.0 11.7*  HCT 37.2 35.9*  PLT 263 261   BMET Recent Labs    07/30/19 0708 07/31/19 0526  NA 134* 136  K 4.3 3.9  CL 104 106  CO2 21* 22  GLUCOSE 117* 110*  BUN 10 8  CREATININE 1.24* 1.14*  CALCIUM 9.2 8.9   PT/INR No results for input(s): LABPROT, INR in the last 72 hours. ABG No results for input(s): PHART, HCO3 in the last 72 hours.  Invalid input(s): PCO2, PO2  Studies/Results:  Anti-infectives: Anti-infectives (From admission, onward)   Start     Dose/Rate Route Frequency Ordered Stop   07/23/19 1615  cefoTEtan (CEFOTAN) 2 g in sodium chloride 0.9 % 100 mL IVPB     2 g 200 mL/hr over 30 Minutes Intravenous On call to O.R. 07/23/19 1501 07/23/19 1820       Assessment/Plan: Patient Active Problem List   Diagnosis Date Noted  . Incarcerated hernia 07/23/2019  . Cholelithiasis with acute cholecystitis 03/20/2015  . Chronic renal insufficiency 03/20/2015  . Essential hypertension 03/20/2015  . Hypokalemia 03/20/2015   Incarcerated incisional hernia - laparoscopic incarcerated incisional hernia repair with mesh x2 - Dr. Rosendo Gros,  07/23/2019 OR 8/19 for port site hernia and obstruction - exlap, removal of mesh, primary repair of port site hernia - POD#1  -NPO, MIVF - await return of bowel fxn -ambulate 5x/day -IS 10x/hr while awake   LOS: 8 days   Sharon Mt. Dema Severin, M.D. [facility]

## 2019-08-01 NOTE — Anesthesia Postprocedure Evaluation (Signed)
Anesthesia Post Note  Patient: Rhonda Payne  Procedure(s) Performed: OPEN INCISIONAL HERNIA REPAIR WITH MESH (N/A Abdomen)     Patient location during evaluation: PACU Anesthesia Type: General Level of consciousness: awake and alert and oriented Pain management: pain level controlled Vital Signs Assessment: post-procedure vital signs reviewed and stable Respiratory status: spontaneous breathing, nonlabored ventilation and respiratory function stable Cardiovascular status: blood pressure returned to baseline Postop Assessment: no apparent nausea or vomiting Anesthetic complications: no      Brennan Bailey

## 2019-08-01 NOTE — Progress Notes (Signed)
Nutrition Follow-up  RD working remotely.  DOCUMENTATION CODES:   Morbid obesity  INTERVENTION:   -RD will follow for diet advancement and supplement as appropriate -If prolonged NPO/ clear liquid diet status is anticipated, consider initiation of nutrition support  NUTRITION DIAGNOSIS:   Inadequate oral intake related to altered GI function as evidenced by NPO status.  Ongoing  GOAL:   Patient will meet greater than or equal to 90% of their needs  Progressing   MONITOR:   Diet advancement, Labs, Weight trends, Skin, I & O's  REASON FOR ASSESSMENT:   Consult New TPN/TNA  ASSESSMENT:   Rhonda Payne is a 63yo female PMH HTN and obesity who presented to [facility] earlier today complaining of abdominal pain. States that the pain started yesterday morning. It is diffuse but most severe periumbilical and left lower quadrant. Worse with walking and palpation. Somewhat better with rest. Initially thought she was constipated and took some milk of magnesia. She had a BM yesterday but this did not improve her pain. Denies nausea, vomiting, fever, chills, dysuria, or back pain. Passing some flatus this morning. She has had nothing to eat/drink today. States that she does not have an appetite.  8/11- s/p Procedure(s): Laparoscopic incisional hernia repair with mesh x2 8/12- advanced to clear liquid diet 8/13- NPO 8/17- pt refused PICC placement 8/18- CT scan revealed recurrence of hernias 8/19- s/p Procedure: Exploratory laparotomy with removal of mesh and repair of port site hernia primarily and primary closure of the abdominal wall  Reviewed I/O's: +532 ml x 24 hours and +12.5 L since admission  UOP: 700 ml x 24 hours  Per general surgery notes, awaiting return of bowel function (last documented BM 07/22/19). Pt has received inadequate nutrition since hospitalization (9 days), due to NPO and clear liquid diet status.   Wt has been stable since admission.   Medications reviewed  and include dextrose 5% and 0.45% NaCl with KCL 20 mEq/L @ 100 ml/hr.   Labs reviewed: CBGS: 83-98.   Diet Order:   Diet Order            Diet NPO time specified Except for: Ice Chips  Diet effective now              EDUCATION NEEDS:   Not appropriate for education at this time  Skin:  Skin Assessment: Skin Integrity Issues: Skin Integrity Issues:: Incisions Incisions: closed abdomen  Last BM:  07/22/19  Height:   Ht Readings from Last 1 Encounters:  07/31/19 5' 5.51" (1.664 m)    Weight:   Wt Readings from Last 1 Encounters:  07/31/19 114 kg    Ideal Body Weight:  56.8 kg  BMI:  Body mass index is 41.17 kg/m.  Estimated Nutritional Needs:   Kcal:  1800-2000  Protein:  110-125 grams  Fluid:  > 1.8 L    Burlin Mcnair A. Jimmye Norman, RD, LDN, [place] Registered Dietitian II Certified Diabetes Care and Education Specialist Pager: [phone] After hours Pager: [phone]

## 2019-08-02 LAB — CBC WITH DIFFERENTIAL/PLATELET
Abs Immature Granulocytes: 0.02 10*3/uL (ref 0.00–0.07)
Basophils Absolute: 0 10*3/uL (ref 0.0–0.1)
Basophils Relative: 0 %
Eosinophils Absolute: 0.1 10*3/uL (ref 0.0–0.5)
Eosinophils Relative: 2 %
HCT: 35.7 % — ABNORMAL LOW (ref 36.0–46.0)
Hemoglobin: 11.4 g/dL — ABNORMAL LOW (ref 12.0–15.0)
Immature Granulocytes: 0 %
Lymphocytes Relative: 15 %
Lymphs Abs: 1.2 10*3/uL (ref 0.7–4.0)
MCH: 25.7 pg — ABNORMAL LOW (ref 26.0–34.0)
MCHC: 31.9 g/dL (ref 30.0–36.0)
MCV: 80.6 fL (ref 80.0–100.0)
Monocytes Absolute: 0.6 10*3/uL (ref 0.1–1.0)
Monocytes Relative: 7 %
Neutro Abs: 6 10*3/uL (ref 1.7–7.7)
Neutrophils Relative %: 76 %
Platelets: 270 10*3/uL (ref 150–400)
RBC: 4.43 MIL/uL (ref 3.87–5.11)
RDW: 13.8 % (ref 11.5–15.5)
WBC: 8 10*3/uL (ref 4.0–10.5)
nRBC: 0 % (ref 0.0–0.2)

## 2019-08-02 LAB — BASIC METABOLIC PANEL
Anion gap: 10 (ref 5–15)
BUN: 9 mg/dL (ref 8–23)
CO2: 18 mmol/L — ABNORMAL LOW (ref 22–32)
Calcium: 8.8 mg/dL — ABNORMAL LOW (ref 8.9–10.3)
Chloride: 107 mmol/L (ref 98–111)
Creatinine, Ser: 0.99 mg/dL (ref 0.44–1.00)
GFR calc Af Amer: >60 mL/min (ref >60)
GFR calc non Af Amer: >60 mL/min (ref >60)
Glucose, Bld: 103 mg/dL — ABNORMAL HIGH (ref 70–99)
Potassium: 4.5 mmol/L (ref 3.5–5.1)
Sodium: 135 mmol/L (ref 135–145)

## 2019-08-02 MED ORDER — ADULT MULTIVITAMIN W/MINERALS CH
1.0000 | ORAL_TABLET | Freq: Every day | ORAL | Status: DC
  Administered 2019-08-02 – 2019-08-03 (×2): 1 via ORAL
  Filled 2019-08-02 (×2): qty 1

## 2019-08-02 MED ORDER — HYDROMORPHONE HCL 1 MG/ML IJ SOLN: 0.5000 mg | INTRAMUSCULAR | Status: DC | PRN

## 2019-08-02 MED ORDER — BOOST / RESOURCE BREEZE PO LIQD CUSTOM
1.0000 | Freq: Three times a day (TID) | ORAL | Status: DC
  Administered 2019-08-02 – 2019-08-03 (×3): 1 via ORAL

## 2019-08-02 MED ORDER — OXYCODONE HCL 5 MG PO TABS
10.0000 mg | ORAL_TABLET | Freq: Four times a day (QID) | ORAL | Status: DC | PRN
  Administered 2019-08-02 – 2019-08-03 (×3): 10 mg via ORAL
  Filled 2019-08-02 (×3): qty 2

## 2019-08-02 NOTE — Progress Notes (Signed)
Nutrition Follow-up  DOCUMENTATION CODES:   Morbid obesity  INTERVENTION:   -Boost Breeze po TID, each supplement provides 250 kcal and 9 grams of protein -MVI with minerals daily -RD will follow for diet advancement and adjust supplement regimen as appropriate -If prolonged NPO/ clear liquid diet status is anticipated, consider initiation of nutrition support  NUTRITION DIAGNOSIS:   Inadequate oral intake related to altered GI function as evidenced by NPO status.  Progressing   GOAL:   Patient will meet greater than or equal to 90% of their needs  Unmet  MONITOR:   Diet advancement, Labs, Weight trends, Skin, I & O's  REASON FOR ASSESSMENT:   Consult New TPN/TNA  ASSESSMENT:   Rhonda Payne is a 63yo female PMH HTN and obesity who presented to [facility] earlier today complaining of abdominal pain. States that the pain started yesterday morning. It is diffuse but most severe periumbilical and left lower quadrant. Worse with walking and palpation. Somewhat better with rest. Initially thought she was constipated and took some milk of magnesia. She had a BM yesterday but this did not improve her pain. Denies nausea, vomiting, fever, chills, dysuria, or back pain. Passing some flatus this morning. She has had nothing to eat/drink today. States that she does not have an appetite.  8/11- s/pProcedure(s): Laparoscopic incisional hernia repair with mesh x2 8/12- advanced to clear liquid diet 8/13- NPO 8/17- pt refused PICC placement 8/18- CT scan revealed recurrence of hernias 8/19- s/p Procedure: Exploratory laparotomy with removal of mesh and repair of port site hernia primarily and primary closure of the abdominal wall  Reviewed I/O's: +321 ml x 24 hours and +12.8 L since admission  UOP: 1.2 L x 24 hours  Per general surgery notes, awaiting return of bowel function. Pt was just advanced to a clear liquid diet today.   Medications reviewed and include dextrose 5%-0.45%  NaCl with KCl mEq/L @ 50 ml/hr.   Labs reviewed: CBGS: 83-98.   Diet Order:   Diet Order            Diet clear liquid Room service appropriate? Yes; Fluid consistency: Thin  Diet effective now              EDUCATION NEEDS:   Not appropriate for education at this time  Skin:  Skin Assessment: Skin Integrity Issues: Skin Integrity Issues:: Incisions Incisions: closed abdomen  Last BM:  07/22/19  Height:   Ht Readings from Last 1 Encounters:  07/31/19 5' 5.51" (1.664 m)    Weight:   Wt Readings from Last 1 Encounters:  07/31/19 114 kg    Ideal Body Weight:  56.8 kg  BMI:  Body mass index is 41.17 kg/m.  Estimated Nutritional Needs:   Kcal:  1800-2000  Protein:  110-125 grams  Fluid:  > 1.8 L    Hanley Woerner A. Jimmye Norman, RD, LDN, [place] Registered Dietitian II Certified Diabetes Care and Education Specialist Pager: [phone] After hours Pager: [phone]

## 2019-08-02 NOTE — Progress Notes (Addendum)
Subjective No acute events. Somewhat tearful this morning that she has not been discharged Abdomen sore but improving daily. Denies n/v. Denies flatus/BM yet. Now ambulating.  Objective: Vital signs in last 24 hours: Temp:  [98 F (36.7 C)-99.5 F (37.5 C)] 99.5 F (37.5 C) (08/21 0522) Pulse Rate:  [74-97] 91 (08/21 0740) Resp:  [12-20] 18 (08/21 0746) BP: (86-117)/(58-78) 106/65 (08/21 0740) SpO2:  [94 %-100 %] 95 % (08/21 0746) Last BM Date: 07/22/19  Intake/Output from previous day: 08/20 0701 - 08/21 0700 In: 1496.3 [I.V.:1496.3] Out: 1175 [Urine:1175] Intake/Output this shift: No intake/output data recorded.  Gen: NAD, comfortable CV: RRR Pulm: Normal work of breathing Abd: Obese, soft, NT. Incisions c/d/ without erythema; midline honeycomb removed; lateral in place but dry. Left in place per her request today. Ext: SCDs in place  Lab Results: CBC  Recent Labs    07/31/19 0526  WBC 5.8  HGB 11.7*  HCT 35.9*  PLT 261   BMET Recent Labs    07/31/19 0526  NA 136  K 3.9  CL 106  CO2 22  GLUCOSE 110*  BUN 8  CREATININE 1.14*  CALCIUM 8.9   PT/INR No results for input(s): LABPROT, INR in the last 72 hours. ABG No results for input(s): PHART, HCO3 in the last 72 hours.  Invalid input(s): PCO2, PO2  Studies/Results:  Anti-infectives: Anti-infectives (From admission, onward)   Start     Dose/Rate Route Frequency Ordered Stop   07/23/19 1615  cefoTEtan (CEFOTAN) 2 g in sodium chloride 0.9 % 100 mL IVPB     2 g 200 mL/hr over 30 Minutes Intravenous On call to O.R. 07/23/19 1501 07/23/19 1820       Assessment/Plan: Patient Active Problem List   Diagnosis Date Noted  . Incarcerated hernia 07/23/2019  . Cholelithiasis with acute cholecystitis 03/20/2015  . Chronic renal insufficiency 03/20/2015  . Essential hypertension 03/20/2015  . Hypokalemia 03/20/2015   Incarcerated incisional hernia - laparoscopic incarcerated incisional hernia repair with  mesh x2 - Dr. Rosendo Gros, 07/23/2019 OR 8/19 for port site hernia and obstruction - exlap, removal of mesh, primary repair of port site hernia - POD#2  -Sips of clear liquids -D/C foley -Decrease MIVF -D/C PCA, continue IV tylenol scheduled; robaxin; started PO oxycodone + PRN IV dilaudid -Ambulate 5x/day -Abdominal binder -Labs pending -IS 10x/hr while awake -PPx: SCDs, Lovenox, PPI -Dispo: Pending return of bowel fxn, toleration of diet; I spent time this morning discussing with her operative findings, recent bowel obstruction and rational for plan of care. She voiced understanding   LOS: 9 days   Sharon Mt. Dema Severin, M.D. [facility]

## 2019-08-02 NOTE — Progress Notes (Signed)
Orthopedic Tech Progress Note Patient Details:  Rhonda Payne October 21, 1956 [id number]  Ortho Devices Type of Ortho Device: Abdominal binder Ortho Device/Splint Interventions: Application   Post Interventions Patient Tolerated: Well Instructions Provided: Care of device   [name] 08/02/2019, 2:25 PM

## 2019-08-02 NOTE — Progress Notes (Signed)
Offered to accompany pt to walk in the hallway, said "I don't want to do it rt now, maybe later.

## 2019-08-02 NOTE — Plan of Care (Signed)
°  Problem: Clinical Measurements: °Goal: Ability to maintain clinical measurements within normal limits will improve °Outcome: Progressing °  °Problem: Elimination: °Goal: Will not experience complications related to urinary retention °Outcome: Progressing °  °Problem: Safety: °Goal: Ability to remain free from injury will improve °Outcome: Progressing °  °

## 2019-08-02 NOTE — Progress Notes (Signed)
Encouraged Pt to ambulate this morning. Pt was able to ambulate back and forth in her room. Tolerated well with assistance.

## 2019-08-03 LAB — BASIC METABOLIC PANEL
Anion gap: 8 (ref 5–15)
BUN: 7 mg/dL — ABNORMAL LOW (ref 8–23)
CO2: 21 mmol/L — ABNORMAL LOW (ref 22–32)
Calcium: 8.5 mg/dL — ABNORMAL LOW (ref 8.9–10.3)
Chloride: 107 mmol/L (ref 98–111)
Creatinine, Ser: 0.97 mg/dL (ref 0.44–1.00)
GFR calc Af Amer: >60 mL/min (ref >60)
GFR calc non Af Amer: >60 mL/min (ref >60)
Glucose, Bld: 96 mg/dL (ref 70–99)
Potassium: 4.2 mmol/L (ref 3.5–5.1)
Sodium: 136 mmol/L (ref 135–145)

## 2019-08-03 LAB — CBC WITH DIFFERENTIAL/PLATELET
Abs Immature Granulocytes: 0.02 10*3/uL (ref 0.00–0.07)
Basophils Absolute: 0 10*3/uL (ref 0.0–0.1)
Basophils Relative: 1 %
Eosinophils Absolute: 0.2 10*3/uL (ref 0.0–0.5)
Eosinophils Relative: 3 %
HCT: 32.6 % — ABNORMAL LOW (ref 36.0–46.0)
Hemoglobin: 10.2 g/dL — ABNORMAL LOW (ref 12.0–15.0)
Immature Granulocytes: 0 %
Lymphocytes Relative: 24 %
Lymphs Abs: 1.5 10*3/uL (ref 0.7–4.0)
MCH: 25.4 pg — ABNORMAL LOW (ref 26.0–34.0)
MCHC: 31.3 g/dL (ref 30.0–36.0)
MCV: 81.1 fL (ref 80.0–100.0)
Monocytes Absolute: 0.4 10*3/uL (ref 0.1–1.0)
Monocytes Relative: 7 %
Neutro Abs: 4.2 10*3/uL (ref 1.7–7.7)
Neutrophils Relative %: 65 %
Platelets: 259 10*3/uL (ref 150–400)
RBC: 4.02 MIL/uL (ref 3.87–5.11)
RDW: 13.8 % (ref 11.5–15.5)
WBC: 6.4 10*3/uL (ref 4.0–10.5)
nRBC: 0 % (ref 0.0–0.2)

## 2019-08-03 MED ORDER — OXYCODONE HCL 10 MG PO TABS: 5.0000 mg | ORAL_TABLET | Freq: Four times a day (QID) | ORAL | 0 refills | Status: DC | PRN

## 2019-08-03 MED ORDER — OXYCODONE HCL 5 MG PO TABS: 5.0000 mg | ORAL_TABLET | Freq: Four times a day (QID) | ORAL | 0 refills | Status: DC | PRN

## 2019-08-03 MED ORDER — METHOCARBAMOL 750 MG PO TABS: 750.0000 mg | ORAL_TABLET | Freq: Three times a day (TID) | ORAL | 0 refills | Status: DC | PRN

## 2019-08-03 NOTE — Care Management (Signed)
Patient states she is unable to pay for prescriptions.  Patient given [place] letter to take to [facility] on [address].

## 2019-08-03 NOTE — Plan of Care (Signed)

## 2019-08-03 NOTE — Progress Notes (Signed)
3 Days Post-Op   Subjective/Chief Complaint: Pain controlled, passing flatus and numerous bms yesterday, tol diet, wants to go home   Objective: Vital signs in last 24 hours: Temp:  [98.7 F (37.1 C)-98.9 F (37.2 C)] 98.9 F (37.2 C) (08/22 0408) Pulse Rate:  [88-90] 89 (08/22 0408) Resp:  [17-18] 17 (08/22 0408) BP: (107-125)/(76-78) 125/78 (08/22 0408) SpO2:  [97 %-100 %] 97 % (08/22 0408) Last BM Date: 08/02/19  Intake/Output from previous day: 08/21 0701 - 08/22 0700 In: 2084 [P.O.:710; I.V.:1324; IV Piggyback:50] Out: -  Intake/Output this shift: Total I/O In: 120 [P.O.:120] Out: -   Gen: NAD, comfortable CV: RRR Pulm: clear Abd: Obese, soft, NT. Incisions c/d/ without erythema   Lab Results:  Recent Labs    08/02/19 1548 08/03/19 0214  WBC 8.0 6.4  HGB 11.4* 10.2*  HCT 35.7* 32.6*  PLT 270 259   BMET Recent Labs    08/02/19 1548 08/03/19 0214  NA 135 136  K 4.5 4.2  CL 107 107  CO2 18* 21*  GLUCOSE 103* 96  BUN 9 7*  CREATININE 0.99 0.97  CALCIUM 8.8* 8.5*   PT/INR No results for input(s): LABPROT, INR in the last 72 hours. ABG No results for input(s): PHART, HCO3 in the last 72 hours.  Invalid input(s): PCO2, PO2  Studies/Results: No results found.  Anti-infectives: Anti-infectives (From admission, onward)   Start     Dose/Rate Route Frequency Ordered Stop   07/23/19 1615  cefoTEtan (CEFOTAN) 2 g in sodium chloride 0.9 % 100 mL IVPB     2 g 200 mL/hr over 30 Minutes Intravenous On call to O.R. 07/23/19 1501 07/23/19 1820      Assessment/Plan: POD 11/3 Incarcerated incisional hernia - laparoscopic incarcerated incisional hernia repair with mesh x2 - Dr. Rosendo Gros, 07/23/2019 exlap, removal of mesh, primary repair of port site hernia -8/19  -regular diet -stop iv fluids -po pain control -Ambulate 5x/day -Abdominal binder -pulm toilet -PPx: SCDs, Lovenox, PPI -Dispo:possibly home later today if tolerates lunch   Rolm Bookbinder 08/03/2019

## 2019-08-03 NOTE — Progress Notes (Signed)
Rhonda Payne to be D/C'd  per MD order. Discussed with the patient and all questions fully answered.  VSS, Skin clean, dry and intact without evidence of skin break down, no evidence of skin tears noted.  IV catheter discontinued intact. Site without signs and symptoms of complications. Dressing and pressure applied.  An After Visit Summary was printed and given to the patient. Patient received prescription.  D/c education completed with patient/family including follow up instructions, medication list, d/c activities limitations if indicated, with other d/c instructions as indicated by MD - patient able to verbalize understanding, all questions fully answered.   Patient instructed to return to ED, call 911, or call MD for any changes in condition.   Patient to be escorted via [place], and D/C home via private auto.

## 2019-08-03 NOTE — Plan of Care (Signed)
°  Problem: Clinical Measurements: °Goal: Ability to maintain clinical measurements within normal limits will improve °Outcome: Progressing °  °Problem: Nutrition: °Goal: Adequate nutrition will be maintained °Outcome: Progressing °  °Problem: Elimination: °Goal: Will not experience complications related to bowel motility °Outcome: Progressing °  °Problem: Pain Managment: °Goal: General experience of comfort will improve °Outcome: Progressing °  °

## 2019-08-05 NOTE — Discharge Summary (Signed)
Physician Discharge Summary  Patient ID: Rhonda Payne MRN: [id number] DOB/AGE: Jul 15, 1956 63 y.o.  Admit date: 07/23/2019 Discharge date: 08/03/2019  Admission Diagnoses:  Incarcerated incisional hernia x2 Hypertension Acute kidney injury Morbid obesity BMI 41 COVID negative    Discharge Diagnoses: Incarcerated incisional hernia x2 Hypertension Acute kidney injury -resolved BMI 41 COVID negative   Active Problems:   Incarcerated hernia   PROCEDURES:  1.  Laparoscopic incisional hernia repair with mesh times 28/11/20 Dr. Ralene Ok 2.  Exploratory laparotomy with through removal of mesh, and repair of port hernia primarily, primary closure abdominal wall 07/31/2019 Dr. Marcello Moores [facility] Course:  Rhonda Payne is a 63yo female PMH HTN and obesity who presented to [facility] earlier today complaining of abdominal pain. States that the pain started yesterday morning. It is diffuse but most severe periumbilical and left lower quadrant. Worse with walking and palpation. Somewhat better with rest. Initially thought she was constipated and took some milk of magnesia. She had a BM yesterday but this did not improve her pain. Denies nausea, vomiting, fever, chills, dysuria, or back pain. Passing some flatus this morning. She has had nothing to eat/drink today. States that she does not have an appetite. 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 WBC 7.0. Creatinine 1.08.  Abdominal surgical history: laparoscopic cholecystectomy, vaginal hysterectomy Anticoagulants: none Nonsmoker Denies alcohol or drug use  Patient was seen in the emergency department admitted by Dr. Rosendo Gros.  She was taken the operating room later in the afternoon after COVID study was negative.  She underwent the first procedure as  described above.  Postop she had some problems with pain still nauseated on sips of liquids.  She made very slow progress.  CT scan was repeated on 07/30/2019.  This showed recurrence of both hernias.  Films were reviewed and Dr. Brantley Stage recommended she return to the OR the next day for the second procedure.  This was completed on 07/31/2019.  By the second postoperative day her pain was fairly well controlled she had had numerous BMs she was tolerated diet and was subsequently discharged home by Dr. Donne Hazel.  CBC Latest Ref Rng & Units 08/03/2019 08/02/2019 07/31/2019  WBC 4.0 - 10.5 K/uL 6.4 8.0 5.8  Hemoglobin 12.0 - 15.0 g/dL 10.2(L) 11.4(L) 11.7(L)  Hematocrit 36.0 - 46.0 % 32.6(L) 35.7(L) 35.9(L)  Platelets 150 - 400 K/uL 259 270 261    CMP Latest Ref Rng & Units 08/03/2019 08/02/2019 07/31/2019  Glucose 70 - 99 mg/dL 96 103(H) 110(H)  BUN 8 - 23 mg/dL 7(L) 9 8  Creatinine 0.44 - 1.00 mg/dL 0.97 0.99 1.14(H)  Sodium 135 - 145 mmol/L 136 135 136  Potassium 3.5 - 5.1 mmol/L 4.2 4.5 3.9  Chloride 98 - 111 mmol/L 107 107 106  CO2 22 - 32 mmol/L 21(L) 18(L) 22  Calcium 8.9 - 10.3 mg/dL 8.5(L) 8.8(L) 8.9  Total Protein 6.5 - 8.1 g/dL - - 6.7  Total Bilirubin 0.3 - 1.2 mg/dL - - 1.3(H)  Alkaline Phos 38 - 126 U/L - - 52  AST 15 - 41 U/L - - 76(H)  ALT 0 - 44 U/L - - 105(H)    Condition on discharge: Improved  Disposition: Home  Allergies as of 08/03/2019      Reactions   Aspirin Rash, Other (See Comments)   Caused pain and  rash      Medication List    TAKE these medications   lisinopril-hydrochlorothiazide 20-25 MG tablet Commonly known as: ZESTORETIC Take 1 tablet by mouth daily.   methocarbamol 750 MG tablet Commonly known as: ROBAXIN Take 1 tablet (750 mg total) by mouth every 8 (eight) hours as needed (use for muscle cramps/pain).   methocarbamol 750 MG tablet Commonly known as: ROBAXIN Take 1 tablet (750 mg total) by mouth every 8 (eight) hours as needed (use for muscle  cramps/pain).   oxyCODONE 5 MG immediate release tablet Commonly known as: Oxy IR/ROXICODONE Take 1 tablet (5 mg total) by mouth every 6 (six) hours as needed for moderate pain, severe pain or breakthrough pain.   VITAMIN D3 PO Take 1 tablet by mouth daily.      Follow-up Information    Axel Filleramirez, Armando, MD. Go on 08/15/2019.   Specialty: General Surgery Why: Your appointment is 08/15/2019 @ 9:10am Please arrive 15 minutes prior to your appointment to check in. Bring photo ID and insurance information. Contact information: [address] [place] [address] [phone]           Signed: Sherrie GeorgeJENNINGS,Genese Quebedeaux 08/05/2019, 9:56 AM

## 2019-09-25 ENCOUNTER — Other Ambulatory Visit: Payer: Self-pay

## 2019-09-25 ENCOUNTER — Encounter: Payer: Self-pay | Admitting: Family Medicine

## 2019-09-25 ENCOUNTER — Ambulatory Visit: Payer: No Typology Code available for payment source | Attending: Family Medicine | Admitting: Family Medicine

## 2019-09-25 VITALS — BP 146/81 | HR 78 | Temp 98.3°F | Ht 65.51 in | Wt 237.0 lb

## 2019-09-25 DIAGNOSIS — E876 Hypokalemia: Secondary | ICD-10-CM

## 2019-09-25 DIAGNOSIS — Z1231 Encounter for screening mammogram for malignant neoplasm of breast: Secondary | ICD-10-CM

## 2019-09-25 DIAGNOSIS — I10 Essential (primary) hypertension: Secondary | ICD-10-CM

## 2019-09-25 DIAGNOSIS — F4321 Adjustment disorder with depressed mood: Secondary | ICD-10-CM

## 2019-09-25 DIAGNOSIS — N289 Disorder of kidney and ureter, unspecified: Secondary | ICD-10-CM

## 2019-09-25 DIAGNOSIS — Z1159 Encounter for screening for other viral diseases: Secondary | ICD-10-CM

## 2019-09-25 DIAGNOSIS — N189 Chronic kidney disease, unspecified: Secondary | ICD-10-CM

## 2019-09-25 DIAGNOSIS — D649 Anemia, unspecified: Secondary | ICD-10-CM

## 2019-09-25 DIAGNOSIS — R7989 Other specified abnormal findings of blood chemistry: Secondary | ICD-10-CM

## 2019-09-25 DIAGNOSIS — F4320 Adjustment disorder, unspecified: Secondary | ICD-10-CM

## 2019-09-25 MED ORDER — ALPRAZOLAM 0.5 MG PO TBDP: 0.5000 mg | ORAL_TABLET | Freq: Every evening | ORAL | 0 refills | Status: DC | PRN

## 2019-09-25 MED ORDER — LISINOPRIL-HYDROCHLOROTHIAZIDE 20-25 MG PO TABS: 1.0000 | ORAL_TABLET | Freq: Every day | ORAL | 1 refills | Status: DC

## 2019-09-25 NOTE — Progress Notes (Signed)
Established Patient Office Visit  Subjective:  Patient ID: Rhonda Payne Tuminello, female    DOB: Aug 01, 1956  Age: 63 y.o. MRN: [id number]  CC:  Chief Complaint  Patient presents with  . Medication Refill    BP meds and vit d   . Follow-up    from all the surgeries she had    HPI Rhonda Payne Roseman presents for follow-up of chronic medical issues and she is status post hospitalization on from 07/23/2019-08/03/2019 due to incarcerated incisional hernia repair x 2 after presenting to the ED with abdominal pain.  She reports that she had initial surgery with mesh repair of hernia but this had to be removed as she had continued pain and further imaging showed recurrence of hernia.  She reports that she does feel somewhat better status post surgery x2 but reports that she still feels fatigued.  She also reports that she has had increased issues with anxiety/depression as a few days after she returned home from hospital discharge her husband was complaining of chest pain which she kept attributing to acid reflux symptoms and he refused to go to the emergency department for further evaluation and later died at home from a massive heart attack.  Patient states that she has not been sleeping well and she is often tearful.  She denies any suicidal thoughts or ideations and no thoughts or intentions of self-harm.        She reports that she continues to remain compliant with her medication for treatment of hypertension.  She has had some occasional mild headaches but she is not sure if this is related to lack of sleep or grief.  She denies any dizziness.  She has had no chest pain or palpitations, no shortness of breath or cough.  She wonders if she needs to be on vitamin D due to her fatigue and she has had low vitamin D in the past.  She will also need a refill of her blood pressure medication.  She would also like CBC in follow-up of anemia.  She believes that she was told in the past that she may have sickle cell  trait.        Since her prolonged hospitalization and her husband's death, she would like to have any preventative care measures that she is due for.  She agrees to be referred for her colonoscopy and also to have order placed for her mammogram.  She is agreeable to screening for hepatitis C.  She reports that she thinks that she has had a prior hysterectomy and therefore will not need a Pap smear.  On further review of systems, she does have some mild abdominal discomfort in the areas of her surgery but this is greatly improved.  She denies any issues with constipation, diarrhea, nausea or vomiting.  She has had no blood in the stool and no black stools.  She denies any peripheral edema.  No urinary urgency, frequency or dysuria.  Past Medical History:  Diagnosis Date  . Anemia   . History of blood transfusion    "when I was a child; related to sickle cell trait"  . Hypertension   . Sickle cell trait [facility])     Past Surgical History:  Procedure Laterality Date  . CHOLECYSTECTOMY N/A 03/20/2015   Procedure: LAPAROSCOPIC CHOLECYSTECTOMY WITH INTRAOPERATIVE CHOLANGIOGRAM;  Surgeon: Ovidio Kinavid Newman, MD;  Location: [facility] OR;  Service: General;  Laterality: N/A;  . DILATION AND CURETTAGE OF UTERUS  1990's  . INCISIONAL HERNIA REPAIR N/A 07/31/2019  Procedure: OPEN INCISIONAL HERNIA REPAIR WITH MESH;  Surgeon: Erroll Luna, MD;  Location: [address];  Service: General;  Laterality: N/A;  . LAPAROSCOPIC CHOLECYSTECTOMY  03/20/2015   w/IOC  . LAPAROSCOPY N/A 07/23/2019   Procedure: laparotomy with hernia repair with mesh;  Surgeon: Ralene Ok, MD;  Location: [facility];  Service: General;  Laterality: N/A;  . VAGINAL HYSTERECTOMY  1990's    Family History  Problem Relation Age of Onset  . Diabetes Mother   . Cancer Father        no sure but thinks it's prostate    Social History   Socioeconomic History  . Marital status: Married    Spouse name: Not on file  . Number of children: Not on file  .  Years of education: Not on file  . Highest education level: Not on file  Occupational History  . Not on file  Social Needs  . Financial resource strain: Not on file  . Food insecurity    Worry: Not on file    Inability: Not on file  . Transportation needs    Medical: Not on file    Non-medical: Not on file  Tobacco Use  . Smoking status: Never Smoker  . Smokeless tobacco: Never Used  Substance and Sexual Activity  . Alcohol use: No  . Drug use: No  . Sexual activity: Never  Lifestyle  . Physical activity    Days per week: Not on file    Minutes per session: Not on file  . Stress: Not on file  Relationships  . Social Herbalist on phone: Not on file    Gets together: Not on file    Attends religious service: Not on file    Active member of club or organization: Not on file    Attends meetings of clubs or organizations: Not on file    Relationship status: Not on file  . Intimate partner violence    Fear of current or ex partner: Not on file    Emotionally abused: Not on file    Physically abused: Not on file    Forced sexual activity: Not on file  Other Topics Concern  . Not on file  Social History Narrative  . Not on file    Outpatient Medications Prior to Visit  Medication Sig Dispense Refill  . Cholecalciferol (VITAMIN D3 PO) Take 1 tablet by mouth daily.     Marland Kitchen lisinopril-hydrochlorothiazide (ZESTORETIC) 20-25 MG tablet Take 1 tablet by mouth daily. 30 tablet 4  . methocarbamol (ROBAXIN) 750 MG tablet Take 1 tablet (750 mg total) by mouth every 8 (eight) hours as needed (use for muscle cramps/pain). (Patient not taking: Reported on 09/25/2019) 20 tablet 0  . methocarbamol (ROBAXIN) 750 MG tablet Take 1 tablet (750 mg total) by mouth every 8 (eight) hours as needed (use for muscle cramps/pain). (Patient not taking: Reported on 09/25/2019) 20 tablet 0  . oxyCODONE (OXY IR/ROXICODONE) 5 MG immediate release tablet Take 1 tablet (5 mg total) by mouth every 6  (six) hours as needed for moderate pain, severe pain or breakthrough pain. (Patient not taking: Reported on 09/25/2019) 12 tablet 0   No facility-administered medications prior to visit.     Allergies  Allergen Reactions  . Aspirin Rash and Other (See Comments)    Caused pain and rash    ROS Review of Systems  Constitutional: Positive for fatigue. Negative for chills and fever.  HENT: Negative for sore throat and trouble  swallowing.   Eyes: Negative for photophobia and visual disturbance.  Respiratory: Negative for cough and shortness of breath.   Cardiovascular: Negative for chest pain, palpitations and leg swelling.  Gastrointestinal: Positive for abdominal pain (Mild status post surgery). Negative for blood in stool, constipation, diarrhea and nausea.  Endocrine: Negative for cold intolerance, heat intolerance, polydipsia, polyphagia and polyuria.  Genitourinary: Negative for dysuria and frequency.  Musculoskeletal: Negative for back pain and gait problem.  Skin: Negative for rash and wound.  Neurological: Negative for dizziness and headaches.  Hematological: Negative for adenopathy. Does not bruise/bleed easily.  Psychiatric/Behavioral: Positive for sleep disturbance. Negative for self-injury and suicidal ideas. The patient is nervous/anxious.        Patient's husband passed away from a heart attack shortly after she was discharged from the hospital      Objective:    Physical Exam  Constitutional: She is oriented to person, place, and time. She appears well-developed and well-nourished.  Well-nourished well-developed overweight for height, obese older female in no acute distress, wearing mask as per office COVID-19 protocol  Neck: Normal range of motion. No JVD present. No thyromegaly present.  Cardiovascular: Normal rate and regular rhythm.  Pulmonary/Chest: Effort normal and breath sounds normal.  Abdominal: Soft. There is abdominal tenderness. There is no rebound and no  guarding.  Mild tenderness in area of umbilicus status post surgical repair of hernia.  No increased warmth, no drainage noted  Musculoskeletal:        General: No tenderness or edema.     Comments: No CVA tenderness  Lymphadenopathy:    She has no cervical adenopathy.  Neurological: She is alert and oriented to person, place, and time.  Skin: Skin is warm and dry.  Psychiatric:  Slightly flattened affect and slight tearfulness when talking about her husband's death  Nursing note and vitals reviewed.   BP (!) 146/81 (BP Location: Left Arm, Patient Position: Sitting, Cuff Size: Large)   Pulse 78   Temp 98.3 F (36.8 C) (Oral)   Ht 5' 5.51" (1.664 m)   Wt 237 lb (107.5 kg)   SpO2 96%   BMI 38.83 kg/m  Wt Readings from Last 3 Encounters:  09/25/19 237 lb (107.5 kg)  07/31/19 251 lb 5.2 oz (114 kg)  03/20/15 269 lb 6.4 oz (122.2 kg)     Health Maintenance Due  Topic Date Due  . Hepatitis C Screening  1956-06-04  . PAP SMEAR-Modifier  01/09/1979  . MAMMOGRAM  01/10/2008  . COLONOSCOPY  01/10/2008    Lab Results  Component Value Date   TSH 2.848 10/02/2007   Lab Results  Component Value Date   WBC 6.4 08/03/2019   HGB 10.2 (L) 08/03/2019   HCT 32.6 (L) 08/03/2019   MCV 81.1 08/03/2019   PLT 259 08/03/2019   Lab Results  Component Value Date   NA 136 08/03/2019   K 4.2 08/03/2019   CO2 21 (L) 08/03/2019   GLUCOSE 96 08/03/2019   BUN 7 (L) 08/03/2019   CREATININE 0.97 08/03/2019   BILITOT 1.3 (H) 07/31/2019   ALKPHOS 52 07/31/2019   AST 76 (H) 07/31/2019   ALT 105 (H) 07/31/2019   PROT 6.7 07/31/2019   ALBUMIN 3.0 (L) 07/31/2019   CALCIUM 8.5 (L) 08/03/2019   ANIONGAP 8 08/03/2019   Lab Results  Component Value Date   CHOL 202 (H) 11/29/2012   Lab Results  Component Value Date   HDL 47 11/29/2012   Lab Results  Component Value  Date   LDLCALC 133 (H) 11/29/2012   Lab Results  Component Value Date   TRIG 82 07/31/2019   Lab Results  Component  Value Date   CHOLHDL 4.3 11/29/2012   No results found for: HGBA1C    Assessment & Plan:  1. Essential hypertension Patient with mild increase in blood pressure today's visit but has also been under recent increased stress due to recent hospitalization followed by the death of her husband shortly after she returned home after her prolonged hospitalization.  She will continue the use of lisinopril hydrochlorothiazide 20-25 as well as a low-sodium diet.  She is encouraged to start a low-low impact walking regimen when she is cleared by surgery to do so.  Continue to remain well-hydrated and monitor blood pressure periodically. - lisinopril-hydrochlorothiazide (ZESTORETIC) 20-25 MG tablet; Take 1 tablet by mouth daily.  Dispense: 90 tablet; Refill: 1  2. Chronic renal impairment, unspecified CKD stage During her recent hospitalization, patient had issues with renal impairment/elevated creatinine with creatinine of 1.29 on 07/29/2019 and creatinine of 1.24 on 07/30/2019.  She will have repeat basic metabolic panel in follow-up of renal insufficiency/chronic kidney disease.  She is encouraged to remain well-hydrated, make sure that her blood pressure remains controlled and to avoid the use of nonsteroidal anti-inflammatories which can worsen kidney function.  Educational material on chronic kidney disease given to patient as part of AVS-after visit summary. - Basic Metabolic Panel  3. Hypokalemia Will obtain BMP as patient has had past issues with hypokalemia and she will be notified if potassium replacement therapy is needed - Basic Metabolic Panel  4. Elevated LFTs Patient with elevated LFTs during hospitalization with AST of 76 and ALT of 105 on comprehensive metabolic panel done 07/31/2019.  She will have repeat hepatic function panel as well as hepatitis C antibody testing at today's visit.  She has been asked to avoid the use of Tylenol and alcohol at this time as these may further injure the  liver.  She will be notified of the results of today's testing and if any further/additional work-up is needed based on the results.  Patient with presence of scattered hepatic cyst on CT scan done 07/30/2019 but these were unchanged from prior imaging.  Patient did have evidence of fatty infiltration of the liver on ultrasound done in 2016 prior to her cholecystectomy. - Hepatic Function Panel - Hepatitis C antibody  5. Anemia, unspecified type Patient is status post surgery x2 during her hospitalization and on her last CBC, done 08/03/2019, patient with hemoglobin of 10.2 with normal MCV of 81.  She will have repeat CBC at today's visit and will be notified if any further interventions are needed based on the results.  Educational material on anemia provided as part of after visit summary/AVS. - CBC with Differential  6. Hypocalcemia We will check vitamin D level as patient with hypercalcemia on recent blood work. - Vitamin D, 25-hydroxy  7. Grief reaction Prescription provided for alprazolam for patient to take as needed to help with grief/sleep as her husband passed away shortly after patient was discharged from the hospital.  We will also place social work consult so that Child psychotherapist can speak with the patient and give advice/counseling regarding grief and bereavement programs. - ALPRAZolam (NIRAVAM) 0.5 MG dissolvable tablet; Take 1 tablet (0.5 mg total) by mouth at bedtime as needed for anxiety. Or sleep  Dispense: 20 tablet; Refill: 0 - Ambulatory referral to Social Work  8. Encounter for screening mammogram for  malignant neoplasm of breast She agrees to have order placed for screening mammogram as a screening tool for breast cancer.  Information on cancer screening for women given as part of AVS.  She does not believe that she needs a Pap smear as she previously had a hysterectomy for noncancerous reasons. - MM Digital Screening; Future  9. Need for hepatitis C screening test Need for  hepatitis C screening discussed with the patient due to her age/time of birth.  She agrees to have hepatitis C testing at today's visit. - Hepatitis C antibody   An After Visit Summary was printed and given to the patient.  Follow-up: Return for HTN/chronic issues:3- 4 months and as needed.   Cain Saupe, MD

## 2019-09-25 NOTE — Patient Instructions (Signed)
Cancer Screening for Women A cancer screening is a test or exam that checks for cancer. Your health care provider will recommend specific cancer screenings based on your age, personal history, and family history of cancer. Work with your health care provider to create a cancer screening schedule that protects your health. Why is cancer screening done? Cancer screening is done to look for cancer in the very early stages, before it spreads and becomes harder to treat and before you would start to notice symptoms. Finding cancer early improves the chances of successful treatment. It may save your life. Who should be screened for cancer? All women should be screened for certain cancers, including breast cancer, cervical cancer, and skin cancer. Your health care provider may recommend screenings for other types of cancer if:  You had cancer before.  You have a family member with cancer.  You have abnormal genes that could increase the risk of cancer.  You have risk factors for certain cancers, such as smoking. When you should be screened for cancer depends on:  Your age.  Your medical history and your family's medical history.  Certain lifestyle factors, such as smoking.  Environmental exposure, such as to asbestos. What are some common cancer screenings? Breast cancer Breast cancer screening is done with a test that takes images of breast tissue (mammogram). Here are some screening guidelines:  When you are age 40-44, you will be given the choice to start having mammograms.  When you are age 45-54, you should have a mammogram every year.  You may start having mammograms before age 45 if you have risk factors for breast cancer, such as having an immediate family member with breast cancer.  When you are age 55 or older, you should have a mammogram every 1-2 years for as long as you are in good health and have a life expectancy of 10 years or more.  It is important to know what your  breasts look and feel like so you can report any changes to your health care provider.  Cervical cancer Cervical cancer screening is done with a Pap test. This testchecks for abnormalities, including the virus that causes cervical cancer (human papillomavirus, or HPV). To perform the test, a health care provider takes a swab of cervical cells during a pelvic exam. Screening for cervical cancer with a Pap test should start at age 21. Here are some screening guidelines:  When you are age 21-29, you should have a Pap test every 3 years.  When you are age 30-65, you should have a Pap test and HPV test every 5 years or have a Pap test every 3 years.  You may be screened for cervical cancer more often if you have risk factors for cervical cancer.  If your Pap tests are abnormal, you may have an HPV test.  If you have had the HPV vaccine, you will still be screened for cervical cancer and follow normal screening recommendations. You do not need to be screened for cervical cancer if any of the following apply to you:  You are older than age 65 and you have not had a serious cervical precancer or cancer in the last 20 years.  Your cervix and uterus have been removed and you have never had cervical cancer or precancerous cells. Endometrial cancer There is no standard screening test for endometrial cancer, but the cancer can be detected with:  A test of a sample of tissue taken from the lining of the uterus (endometrial tissue   biopsy).  A vaginal ultrasound.  Pap tests. If you are at increased risk for endometrial cancer, you may need to have these tests more often than normal. You are at increased risk if:  You have a family history of ovarian, uterine, or colon cancer.  You are taking tamoxifen, a drug that is used to treat breast cancer.  You have certain types of colon cancer. If you have reached menopause, it is especially important to talk with your health care provider about any vaginal  bleeding or spotting. Screening for endometrial cancer is not recommended for women who do not have symptoms of the cancer, such as vaginal bleeding. Colorectal cancer  All adults should have screening for colorectal cancer starting at age 50 and continuing until age 75. Your health care provider may recommend screening at age 45. You will have tests every 1-10 years, depending on your results and the type of screening test. If you have a family history of colon or rectal cancer or other risk factors, you may need to start having screenings earlier. Talk with your health care provider about which screening test is right for you and how often you should be screened. Colorectal cancer screening looks for cancer or for growths called polyps that often form before cancer starts. Tests to look for cancer or polyps include:  Colonoscopy or flexible sigmoidoscopy. For these procedures, a flexible tube with a small camera is inserted into the rectum.  CT colonography. This test uses X-rays and a contrast dye to check the colon for polyps. If a polyp is found, you may need to have a colonoscopy so the polyp can be located and removed. Tests to look for cancer in the stool (feces) include:  Guaiac-based fecal occult blood test (FOBT). This test detects blood in stool. It can be done at home with a kit.  Fecal immunochemical test (FIT). This test detects blood in stool. For this test, you will need to collect stool samples at home.  Stool DNA test. This test looks for blood in stool and any changes in DNA that can lead to colon cancer. For this test, you will need to collect a stool sample at home and send it to a lab.  Skin cancer Skin cancer screening is done by checking the skin for unusual moles or spots and any changes in existing moles. Your health care provider should check your skin for signs of skin cancer at every physical exam. You should check your skin every month and tell your health care  provider right away if anything looks unusual. Women with a higher-than-normal risk for skin cancer may want to see a skin specialist (dermatologist) for an annual body check. Lung cancer Lung cancer screening is done with a CT scan that looks for abnormal cells in the lungs. Discuss lung cancer screening with your health care provider if you are 55-74 years old and if any of the following apply to you:  You currently smoke.  You used to smoke heavily.  You have a smoking history of 1 pack a day for 30 years or 2 packs a day for 15 years.  You have quit smoking within the past 15 years. If you smoke heavily or if you used to smoke, you may need to be screened every year. Where to find more information  National Cancer Institute: https://www.cancer.gov/about-cancer/screening  Centers for Disease Control and Prevention: https://www.cdc.gov/cancer/dcpc/prevention/screening.htm  Department of Health and Human Services: https://www.womenshealth.gov/screening-tests-and-vaccines/screening-tests-for-women Contact a health care provider if:  You have   concerns about any signs or symptoms of cancer, such as: ? Moles that have an unusual shape or color. ? Changes in existing moles. ? A sore on your skin that does not heal. ? Blood in your stool. ? Fatigue that does not go away. ? Frequent pain or cramping in your abdomen. ? Coughing, or coughing up blood. ? Losing weight without trying. ? Lumps or other changes in your breasts. ? Vaginal bleeding, spotting, or changes in your periods. Summary  Be aware of and watch for signs and symptoms of cancer, especially symptoms of breast cancer, cervical cancer, endometrial cancer, colorectal cancer, skin cancer, and lung cancer.  Early detection of cancer with cancer screening may save your life.  Talk with your health care provider about your specific cancer risks.  Work together with your health care provider to create a cancer screening plan  that is right for you. This information is not intended to replace advice given to you by your health care provider. Make sure you discuss any questions you have with your health care provider. Document Released: 08/25/2016 Document Revised: 03/20/2019 Document Reviewed: 08/25/2016 Elsevier Patient Education  [place].  Chronic Kidney Disease, Adult Chronic kidney disease (CKD) happens when the kidneys are damaged over a long period of time. The kidneys are two organs that help with:  Getting rid of waste and extra fluid from the blood.  Making hormones that maintain the amount of fluid in your tissues and blood vessels.  Making sure that the body has the right amount of fluids and chemicals. Most of the time, CKD does not go away, but it can usually be controlled. Steps must be taken to slow down the kidney damage or to stop it from getting worse. If this is not done, the kidneys may stop working. Follow these instructions at home: Medicines  Take over-the-counter and prescription medicines only as told by your doctor. You may need to change the amount of medicines you take.  Do not take any new medicines unless your doctor says it is okay. Many medicines can make your kidney damage worse.  Do not take any vitamin and supplements unless your doctor says it is okay. Many vitamins and supplements can make your kidney damage worse. General instructions  Follow a diet as told by your doctor. You may need to stay away from: ? Alcohol. ? Salty foods. ? Foods that are high in:  Potassium.  Calcium.  Protein.  Do not use any products that contain nicotine or tobacco, such as cigarettes and e-cigarettes. If you need help quitting, ask your doctor.  Keep track of your blood pressure at home. Tell your doctor about any changes.  If you have diabetes, keep track of your blood sugar as told by your doctor.  Try to stay at a healthy weight. If you need help, ask your doctor.   Exercise at least 30 minutes a day, 5 days a week.  Stay up-to-date with your shots (immunizations) as told by your doctor.  Keep all follow-up visits as told by your doctor. This is important. Contact a doctor if:  Your symptoms get worse.  You have new symptoms. Get help right away if:  You have symptoms of end-stage kidney disease. These may include: ? Headaches. ? Numbness in your hands or feet. ? Easy bruising. ? Having hiccups often. ? Chest pain. ? Shortness of breath. ? Stopping of menstrual periods in women.  You have a fever.  You have very little  pee (urine).  You have pain or bleeding when you pee. Summary  Chronic kidney disease (CKD) happens when the kidneys are damaged over a long period of time.  Most of the time, this condition does not go away, but it can usually be controlled. Steps must be taken to slow down the kidney damage or to stop it from getting worse.  Treatment may include a combination of medicines and lifestyle changes. This information is not intended to replace advice given to you by your health care provider. Make sure you discuss any questions you have with your health care provider. Document Released: 02/22/2010 Document Revised: 11/10/2017 Document Reviewed: 01/02/2017 Elsevier Patient Education  [place].  Anemia  Anemia is a condition in which you do not have enough red blood cells or hemoglobin. Hemoglobin is a substance in red blood cells that carries oxygen. When you do not have enough red blood cells or hemoglobin (are anemic), your body cannot get enough oxygen and your organs may not work properly. As a result, you may feel very tired or have other problems. What are the causes? Common causes of anemia include:  Excessive bleeding. Anemia can be caused by excessive bleeding inside or outside the body, including bleeding from the intestine or from periods in women.  Poor nutrition.  Long-lasting (chronic) kidney,  thyroid, and liver disease.  Bone marrow disorders.  Cancer and treatments for cancer.  HIV (human immunodeficiency virus) and AIDS (acquired immunodeficiency syndrome).  Treatments for HIV and AIDS.  Spleen problems.  Blood disorders.  Infections, medicines, and autoimmune disorders that destroy red blood cells. What are the signs or symptoms? Symptoms of this condition include:  Minor weakness.  Dizziness.  Headache.  Feeling heartbeats that are irregular or faster than normal (palpitations).  Shortness of breath, especially with exercise.  Paleness.  Cold sensitivity.  Indigestion.  Nausea.  Difficulty sleeping.  Difficulty concentrating. Symptoms may occur suddenly or develop slowly. If your anemia is mild, you may not have symptoms. How is this diagnosed? This condition is diagnosed based on:  Blood tests.  Your medical history.  A physical exam.  Bone marrow biopsy. Your health care provider may also check your stool (feces) for blood and may do additional testing to look for the cause of your bleeding. You may also have other tests, including:  Imaging tests, such as a CT scan or MRI.  Endoscopy.  Colonoscopy. How is this treated? Treatment for this condition depends on the cause. If you continue to lose a lot of blood, you may need to be treated at a hospital. Treatment may include:  Taking supplements of iron, vitamin I77, or folic acid.  Taking a hormone medicine (erythropoietin) that can help to stimulate red blood cell growth.  Having a blood transfusion. This may be needed if you lose a lot of blood.  Making changes to your diet.  Having surgery to remove your spleen. Follow these instructions at home:  Take over-the-counter and prescription medicines only as told by your health care provider.  Take supplements only as told by your health care provider.  Follow any diet instructions that you were given.  Keep all follow-up  visits as told by your health care provider. This is important. Contact a health care provider if:  You develop new bleeding anywhere in the body. Get help right away if:  You are very weak.  You are short of breath.  You have pain in your abdomen or chest.  You are  dizzy or feel faint.  You have trouble concentrating.  You have bloody or black, tarry stools.  You vomit repeatedly or you vomit up blood. Summary  Anemia is a condition in which you do not have enough red blood cells or enough of a substance in your red blood cells that carries oxygen (hemoglobin).  Symptoms may occur suddenly or develop slowly.  If your anemia is mild, you may not have symptoms.  This condition is diagnosed with blood tests as well as a medical history and physical exam. Other tests may be needed.  Treatment for this condition depends on the cause of the anemia. This information is not intended to replace advice given to you by your health care provider. Make sure you discuss any questions you have with your health care provider. Document Released: 01/05/2005 Document Revised: 11/10/2017 Document Reviewed: 12/30/2016 Elsevier Patient Education  2020 [employer].

## 2019-09-26 LAB — BASIC METABOLIC PANEL WITH GFR
BUN/Creatinine Ratio: 13 (ref 12–28)
BUN: 12 mg/dL (ref 8–27)
CO2: 26 mmol/L (ref 20–29)
Calcium: 9.6 mg/dL (ref 8.7–10.3)
Chloride: 103 mmol/L (ref 96–106)
Creatinine, Ser: 0.95 mg/dL (ref 0.57–1.00)
GFR calc Af Amer: 75 mL/min/1.73
GFR calc non Af Amer: 65 mL/min/1.73
Glucose: 87 mg/dL (ref 65–99)
Potassium: 4.2 mmol/L (ref 3.5–5.2)
Sodium: 142 mmol/L (ref 134–144)

## 2019-09-26 LAB — CBC WITH DIFFERENTIAL/PLATELET
Basophils Absolute: 0 x10E3/uL (ref 0.0–0.2)
Basos: 1 %
EOS (ABSOLUTE): 0.1 x10E3/uL (ref 0.0–0.4)
Eos: 2 %
Hematocrit: 36.2 % (ref 34.0–46.6)
Hemoglobin: 11.5 g/dL (ref 11.1–15.9)
Immature Grans (Abs): 0 x10E3/uL (ref 0.0–0.1)
Immature Granulocytes: 0 %
Lymphocytes Absolute: 1.8 x10E3/uL (ref 0.7–3.1)
Lymphs: 36 %
MCH: 25.1 pg — ABNORMAL LOW (ref 26.6–33.0)
MCHC: 31.8 g/dL (ref 31.5–35.7)
MCV: 79 fL (ref 79–97)
Monocytes Absolute: 0.3 x10E3/uL (ref 0.1–0.9)
Monocytes: 6 %
Neutrophils Absolute: 2.7 x10E3/uL (ref 1.4–7.0)
Neutrophils: 55 %
Platelets: 268 x10E3/uL (ref 150–450)
RBC: 4.59 x10E6/uL (ref 3.77–5.28)
RDW: 14.6 % (ref 11.7–15.4)
WBC: 4.9 x10E3/uL (ref 3.4–10.8)

## 2019-09-26 LAB — HEPATITIS C ANTIBODY: Hep C Virus Ab: <0.1 {s_co_ratio} (ref 0.0–0.9)

## 2019-09-26 LAB — HEPATIC FUNCTION PANEL
ALT: 12 IU/L (ref 0–32)
AST: 18 IU/L (ref 0–40)
Albumin: 4.1 g/dL (ref 3.8–4.8)
Alkaline Phosphatase: 59 IU/L (ref 39–117)
Bilirubin Total: 0.6 mg/dL (ref 0.0–1.2)
Bilirubin, Direct: 0.18 mg/dL (ref 0.00–0.40)
Total Protein: 7.3 g/dL (ref 6.0–8.5)

## 2019-09-26 LAB — VITAMIN D 25 HYDROXY (VIT D DEFICIENCY, FRACTURES): Vit D, 25-Hydroxy: 18.2 ng/mL — ABNORMAL LOW (ref 30.0–100.0)

## 2019-09-29 ENCOUNTER — Other Ambulatory Visit (HOSPITAL_BASED_OUTPATIENT_CLINIC_OR_DEPARTMENT_OTHER): Payer: No Typology Code available for payment source | Admitting: Family Medicine

## 2019-09-29 DIAGNOSIS — E559 Vitamin D deficiency, unspecified: Secondary | ICD-10-CM

## 2019-09-29 MED ORDER — VITAMIN D (ERGOCALCIFEROL) 1.25 MG (50000 UNIT) PO CAPS: 50000.0000 [IU] | ORAL_CAPSULE | ORAL | 3 refills | Status: DC

## 2019-09-29 NOTE — Progress Notes (Signed)
Patient ID: Rhonda Payne, female   DOB: September 16, 1956, 63 y.o.   MRN: [id number]   Patient with low vitamin D level on recent labs.  Patient will be notified that prescription has been sent to her pharmacy for vitamin D replacement therapy and will have repeat level done in approximately 6 months

## 2019-09-30 MED FILL — VIT D2 1.25 MG (50,000 UNIT: 1.25 MG | 28 days supply | Qty: 4 | Fill #0

## 2019-10-16 ENCOUNTER — Ambulatory Visit: Payer: Self-pay | Attending: Family Medicine | Admitting: Licensed Clinical Social Worker

## 2019-10-16 ENCOUNTER — Other Ambulatory Visit: Payer: Self-pay

## 2019-10-16 DIAGNOSIS — F4321 Adjustment disorder with depressed mood: Secondary | ICD-10-CM

## 2019-10-18 NOTE — BH Specialist Note (Signed)
Integrated Behavioral Health Visit via Telemedicine (Telephone)  10/16/2019 Rhonda Payne [id number]   Session Start time: 1:45 PM  Session End time: 2:05 PM Total time: 20  Referring Provider: Dr. Chapman Fitch Type of Visit: Telephonic Patient location: Home [facility] Provider location: Office All persons participating in visit: LCSW and Patient  Confirmed patient's address: Yes  Confirmed patient's phone number: Yes  Any changes to demographics: No   Confirmed patient's insurance: Yes  Any changes to patient's insurance: No   Discussed confidentiality: Yes    The following statements were read to the patient and/or legal guardian that are established with the [facility] Provider.  "The purpose of this phone visit is to provide behavioral health care while limiting exposure to the coronavirus (COVID19).  There is a possibility of technology failure and discussed alternative modes of communication if that failure occurs."  "By engaging in this telephone visit, you consent to the provision of healthcare.  Additionally, you authorize for your insurance to be billed for the services provided during this telephone visit."   Patient and/or legal guardian consented to telephone visit: Yes   PRESENTING CONCERNS: Patient and/or family reports the following symptoms/concerns: Pt is grieving the recent loss of spouse weeks after surgery. They were together for 8 years of marriage. Pt reports episodes of sadness and anger towards him for not receiving medical attention pt continually suggested.   Pt receives strong support from adult children. Duration of problem: August 2020; Severity of problem: moderate  STRENGTHS (Protective Factors/Coping Skills): Pt receives strong support from family Pt has good insight  GOALS ADDRESSED: Patient will: 1.  Reduce symptoms of: anxiety and depression  2.  Increase knowledge and/or ability of: coping skills and healthy habits  3.  Demonstrate  ability to: Begin healthy grieving over loss  INTERVENTIONS: Interventions utilized:  Solution-Focused Strategies, Supportive Counseling and Psychoeducation and/or Health Education Standardized Assessments completed: Not Needed  ASSESSMENT: Patient currently experiencing symptoms of depression and anxiety triggered by grief. Pt's spouse recently passed unexpectedly. She receives strong support from adult children and calls daily.    LCSW provided validation and encouragement. Stages of grief were discussed, in addition, to therapeutic strategies to assist in management of symptoms. Pt was successful in identifying healthy coping skills (staying busy with family, crocheting/knitting, praying) LCSW informed pt of local grief support resources.   PLAN: 1. Follow up with behavioral health clinician on : LCSW will follow up with patient in 2 weeks 2. Behavioral recommendations: Utilize healthy coping skills discussed and resources 3. Referral(s): [place] (In Clinic)  Rebekah Chesterfield, [place] 10/18/2019 5:13 PM

## 2019-11-13 MED FILL — VIT D2 1.25 MG (50,000 UNIT: 1.25 MG | 28 days supply | Qty: 4 | Fill #1

## 2019-12-26 ENCOUNTER — Ambulatory Visit: Payer: Self-pay | Admitting: Family Medicine

## 2020-01-22 ENCOUNTER — Ambulatory Visit: Payer: Self-pay | Attending: Family Medicine | Admitting: Family Medicine

## 2020-01-22 ENCOUNTER — Encounter: Payer: Self-pay | Admitting: Family Medicine

## 2020-01-22 ENCOUNTER — Other Ambulatory Visit: Payer: Self-pay

## 2020-01-22 DIAGNOSIS — I10 Essential (primary) hypertension: Secondary | ICD-10-CM

## 2020-01-22 DIAGNOSIS — E559 Vitamin D deficiency, unspecified: Secondary | ICD-10-CM

## 2020-01-22 MED ORDER — LISINOPRIL-HYDROCHLOROTHIAZIDE 20-25 MG PO TABS: 1.0000 | ORAL_TABLET | Freq: Every day | ORAL | 1 refills | Status: DC

## 2020-01-22 NOTE — Progress Notes (Signed)
Virtual Visit via Telephone Note  I connected with Rhonda Payne on 01/22/20 at  2:10 PM EST by telephone and verified that I am speaking with the correct person using two identifiers.   I discussed the limitations, risks, security and privacy concerns of performing an evaluation and management service by telephone and the availability of in person appointments. I also discussed with the patient that there may be a patient responsible charge related to this service. The patient expressed understanding and agreed to proceed.  Patient Location: Home Provider Location: [facility] Office Others participating in call: none   History of Present Illness:        64 year old female seen in follow-up of chronic medical issues.  She reports that she needs refill of her medication for treatment of hypertension.  She does not monitor her blood pressure at home but denies any headaches or dizziness related to her blood pressure.  She has had no chest pain or palpitations, no shortness of breath or cough and no abdominal pain.  She reports that she continues to take over-the-counter vitamin D after completing her prescription vitamin D therapy and she states that she does not drink a lot of milk or other dairy products to get additional vitamin D.  Overall she feels well and is having no issues at this time.   Past Medical History:  Diagnosis Date  . Anemia   . History of blood transfusion    "when I was a child; related to sickle cell trait"  . Hypertension   . Sickle cell trait [facility])     Past Surgical History:  Procedure Laterality Date  . CHOLECYSTECTOMY N/A 03/20/2015   Procedure: LAPAROSCOPIC CHOLECYSTECTOMY WITH INTRAOPERATIVE CHOLANGIOGRAM;  Surgeon: Ovidio Kin, MD;  Location: [facility] OR;  Service: General;  Laterality: N/A;  . DILATION AND CURETTAGE OF UTERUS  1990's  . INCISIONAL HERNIA REPAIR N/A 07/31/2019   Procedure: OPEN INCISIONAL HERNIA REPAIR WITH MESH;  Surgeon: Harriette Bouillon, MD;   Location: [facility] OR;  Service: General;  Laterality: N/A;  . LAPAROSCOPIC CHOLECYSTECTOMY  03/20/2015   w/IOC  . LAPAROSCOPY N/A 07/23/2019   Procedure: laparotomy with hernia repair with mesh;  Surgeon: Axel Filler, MD;  Location: [facility] OR;  Service: General;  Laterality: N/A;  . VAGINAL HYSTERECTOMY  1990's    Family History  Problem Relation Age of Onset  . Diabetes Mother   . Cancer Father        no sure but thinks it's prostate    Social History   Tobacco Use  . Smoking status: Never Smoker  . Smokeless tobacco: Never Used  Substance Use Topics  . Alcohol use: No  . Drug use: No     Allergies  Allergen Reactions  . Aspirin Rash and Other (See Comments)    Caused pain and rash       Observations/Objective: No vital signs or physical exam conducted as visit was done via telephone  Assessment and Plan: 1. Essential hypertension She believes that her blood pressure is currently controlled on her lisinopril hydrochlorothiazide and new prescription sent to her pharmacy.  She is encouraged to monitor her blood pressure periodically and continue a healthy diet with regular low impact exercise.  She has been asked to schedule follow-up in 4 to 5 months and will have repeat electrolytes at that time and follow-up of hypertension as well as use of medication. - lisinopril-hydrochlorothiazide (ZESTORETIC) 20-25 MG tablet; Take 1 tablet by mouth daily.  Dispense: 90 tablet; Refill: 1  2. Vitamin D deficiency Patient with vitamin D deficiency and she has completed her prescription vitamin D.  Labs from August 03, 2019 and 09/25/2019 were all reviewed with the patient at today's visit.  Patient's vitamin D level on 09/25/2019 was low at 18.2.  She is encouraged to continue her daily vitamin D3 at 1000 IUs and will have repeat vitamin D level at her follow-up appointment in 4 to 5 months.  Follow Up Instructions:Return in about 5 months (around 06/20/2020) for Hypertension/vitamin D  deficiency; blood work at upcoming appointment.   I discussed the assessment and treatment plan with the patient. The patient was provided an opportunity to ask questions and all were answered. The patient agreed with the plan and demonstrated an understanding of the instructions.   The patient was advised to call back or seek an in-person evaluation if the symptoms worsen or if the condition fails to improve as anticipated.  I provided 6 minutes of non-face-to-face time during this encounter.   Antony Blackbird, MD

## 2020-06-22 ENCOUNTER — Ambulatory Visit: Payer: Self-pay | Admitting: Family

## 2020-10-17 IMAGING — CT CT ABDOMEN AND PELVIS WITH CONTRAST
2 of 5 series · 15 of 46 positions shown, 17 images · IV contrast (Omni 300)
Comparison: None.

CLINICAL DATA: Abdominal pain

EXAM:
CT ABDOMEN AND PELVIS WITH CONTRAST
TECHNIQUE: Multidetector CT imaging of the abdomen and pelvis was performed
using the standard protocol following bolus administration of
intravenous contrast.
CONTRAST:  100mL 3NL2QU-PPP IOPAMIDOL (3NL2QU-PPP) INJECTION 61%

[Series 3: a/p w/ 5mm · axial · 0.96mm/px · z∈[+871,+1266]mm · 12 of 89 slices shown, 14 images]
[im 5/89  soft-tissue]
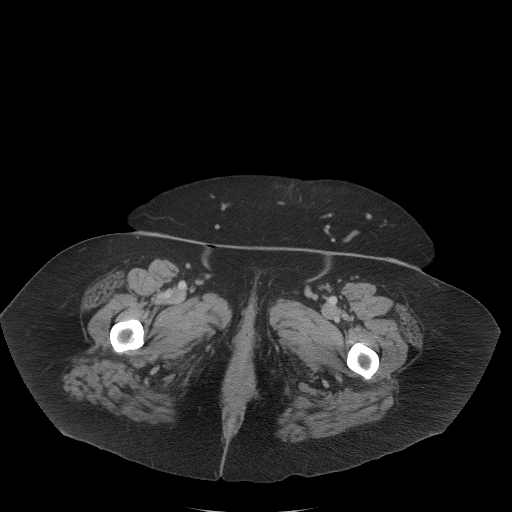
[im 5/89  bone]
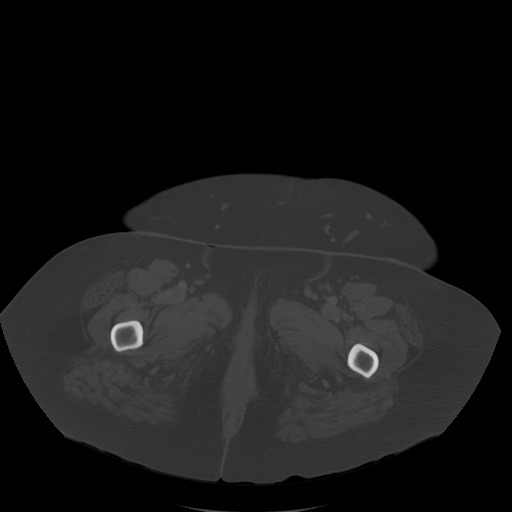
[im 14/89  soft-tissue]
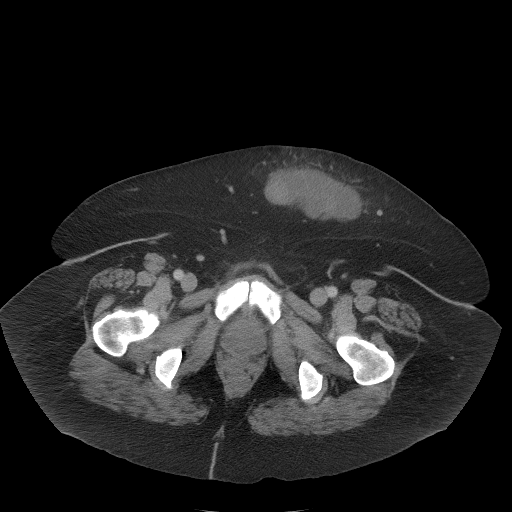
[im 19/89  soft-tissue]
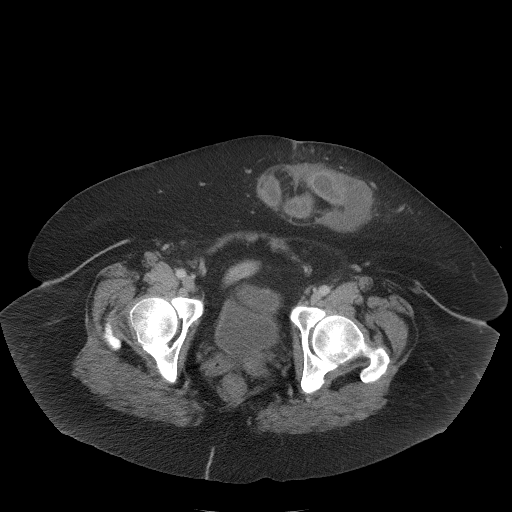
[im 28/89  soft-tissue]
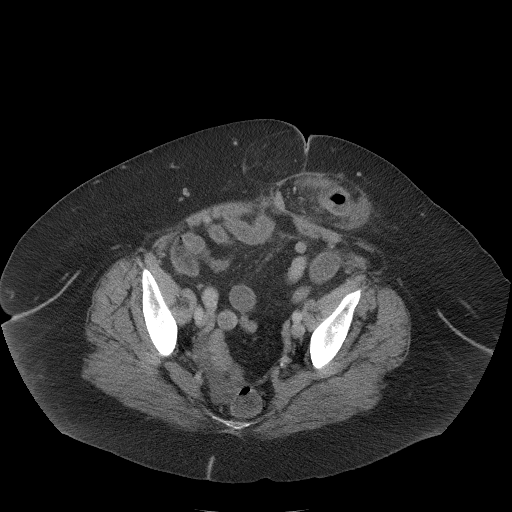
[im 33/89  soft-tissue]
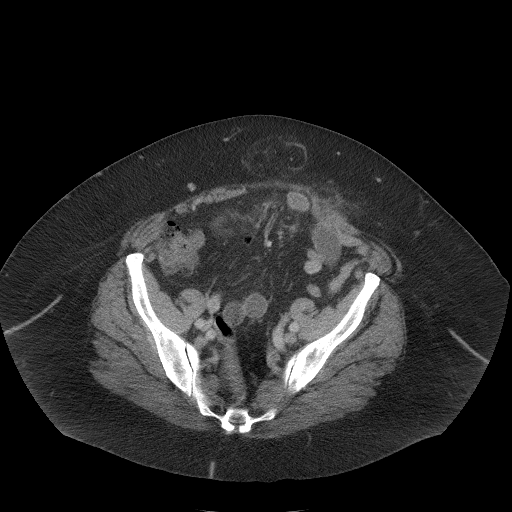
[im 42/89  soft-tissue]
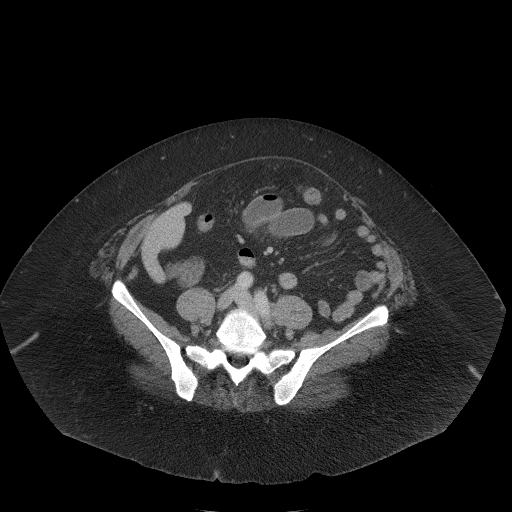
[im 47/89  soft-tissue]
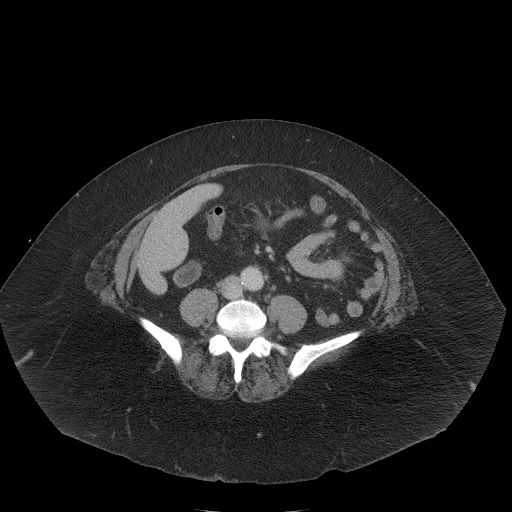
[im 56/89  soft-tissue]
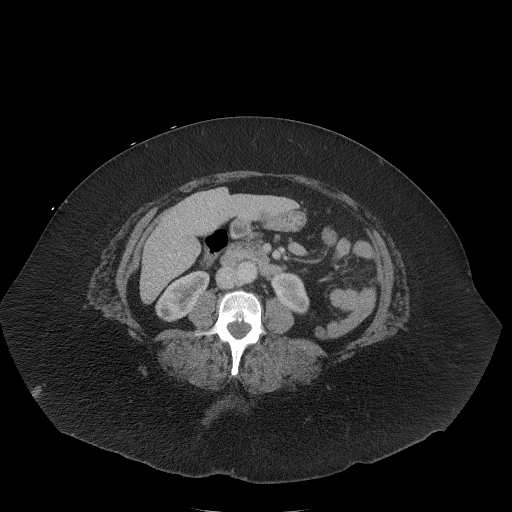
[im 61/89  soft-tissue]
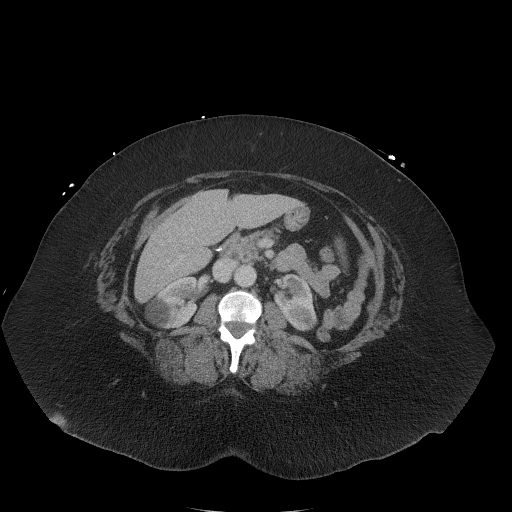
[im 61/89  bone]
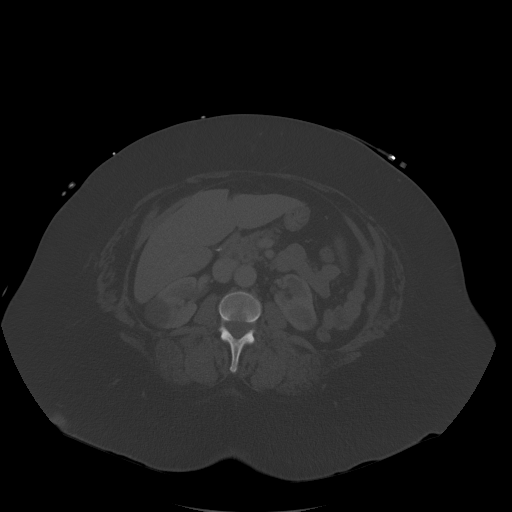
[im 70/89  soft-tissue]
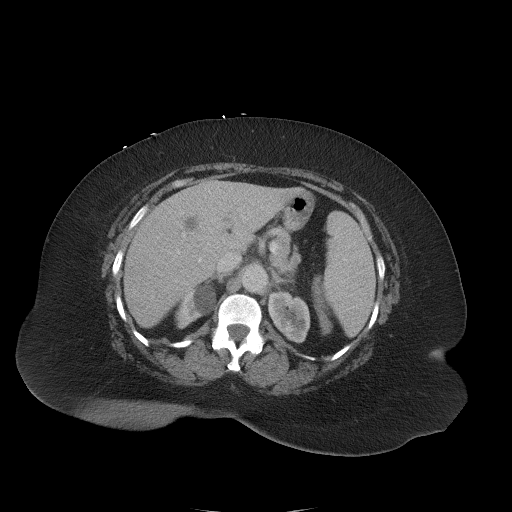
[im 75/89  soft-tissue]
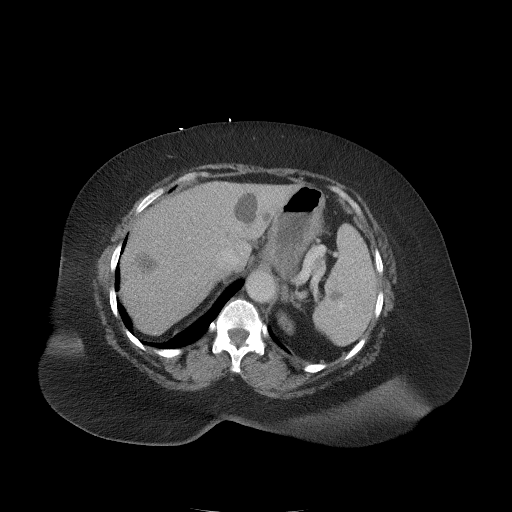
[im 84/89  soft-tissue]
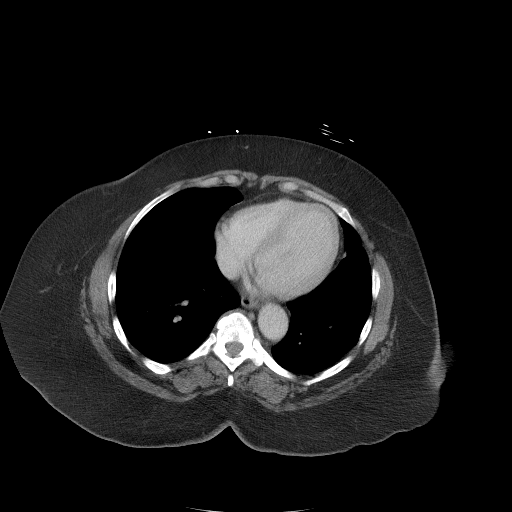

[Series 6: a/p w/ cor · coronal · 1.00mm/px · 3 of 195 slices shown]
[im 65/195  soft-tissue]
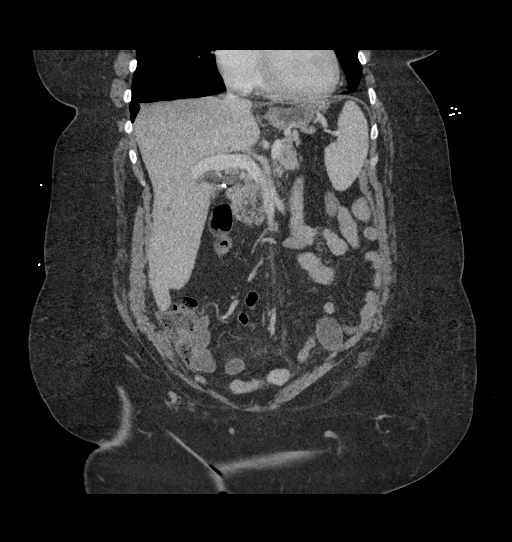
[im 87/195  soft-tissue]
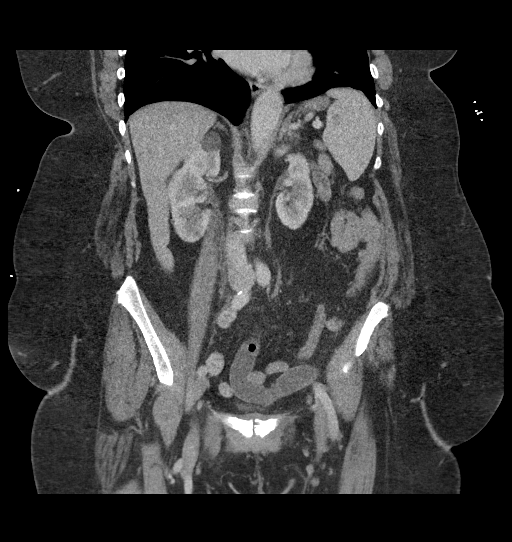
[im 108/195  soft-tissue]
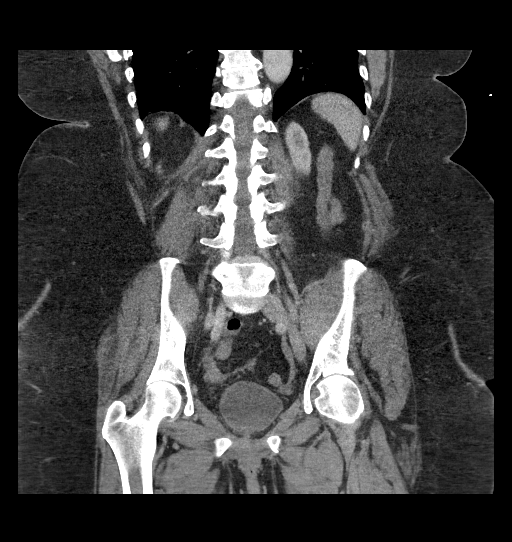

[15 of 46 positions shown; findings below may reference images not displayed]

FINDINGS: Lower chest: Lung bases are clear.

Hepatobiliary: There are multiple cysts throughout the liver,
largest measuring approximately 3 x 2.5 cm. No noncystic liver
lesions are evident. There is a Riedel's lobe on the right, an
anatomic variant. The gallbladder is absent. There is no biliary
duct dilatation.

Pancreas: There is no pancreatic mass or inflammatory focus.

Spleen: There is an apparent hemangioma in the medial spleen
measuring 1.4 x 1.0 cm. No other splenic lesions are evident.

Adrenals/Urinary Tract: Adrenals bilaterally appear normal. There
are cysts in each kidney. Largest cyst arises from the lateral right
kidney measuring 3.2 x 3.0 cm. There is no hydronephrosis on either
side. There is no evident renal or ureteral calculus on either side.
Urinary bladder is midline with wall thickness within normal limits.

Stomach/Bowel: There is no appreciable diverticulitis. There is a
left-sided spigelian type hernia slightly to the left of midline
which contains several loops of bowel. The bowel loops within this
hernia show mild thickening with adjacent fluid. There is, however,
no appreciable bowel obstruction. Elsewhere, there is no appreciable
bowel wall thickening. The terminal ileum appears unremarkable. No
free air or portal venous air evident.

Vascular/Lymphatic: No abdominal aortic aneurysm. There is slight
aortic and iliac artery atherosclerosis. No adenopathy is evident in
the abdomen or pelvis.

Reproductive: Uterus is absent. No pelvic mass. There is fluid in
the cul-de-sac region.

Other: There is no periappendiceal region inflammation. Appendix
appears normal. There is no abscess in the abdomen or pelvis.

As noted above, there is a left spigelian hernia slightly to the
left of midline which contains loops of bowel with mildly thickened
walls and moderate fluid. There is no frank bowel obstruction in
this area. There is in addition a midline ventral hernia which
contains fat but no bowel. Note that at the level of the spigelian
hernia, the neck of the hernia from left to right measures 2.6 cm.
The neck from superior to inferior dimension measures 3.0 cm.

Musculoskeletal: No blastic or lytic bone lesions evident. No
intramuscular lesions are evident.
IMPRESSION: 1. Left anterior spigelian type hernia. Loops of small bowel extend
into this hernia with localized wall thickening of these loops.
There is also moderate fluid in this area. There is felt to be
localized inflammation involving the bowel in this area, but there
is no evident bowel obstruction. The appearance does raise concern
for potential early bowel compromise involving the loops within this
spigelian hernia. Surgical consultation in this regard may be
advisable.

No intramural bowel air.  No perforation in this area.

2. Elsewhere no bowel obstruction. No abscess in the abdomen or
pelvis. Appendix appears normal.

3. Fluid in the cul-de-sac potentially could indicate recent ovarian
cyst rupture or have inflammatory etiology, possibly as a result of
the spigelian hernia.

4. No renal or ureteral calculus. No hydronephrosis. Urinary bladder
wall thickness normal.

5.  Several renal and hepatic cysts.

6.  Small benign splenic hemangioma.

7.  Gallbladder and uterus absent.

## 2022-04-03 ENCOUNTER — Other Ambulatory Visit: Payer: Self-pay

## 2022-04-03 ENCOUNTER — Encounter (HOSPITAL_COMMUNITY): Payer: Self-pay

## 2022-04-03 ENCOUNTER — Emergency Department (HOSPITAL_COMMUNITY)
Admission: EM | Admit: 2022-04-03 | Discharge: 2022-04-04 | Disposition: A | Discharge status: Home / Self Care | Payer: Self-pay | Attending: Emergency Medicine | Admitting: Emergency Medicine

## 2022-04-03 DIAGNOSIS — I10 Essential (primary) hypertension: Secondary | ICD-10-CM | POA: Insufficient documentation

## 2022-04-03 DIAGNOSIS — R1110 Vomiting, unspecified: Secondary | ICD-10-CM | POA: Insufficient documentation

## 2022-04-03 DIAGNOSIS — R42 Dizziness and giddiness: Secondary | ICD-10-CM | POA: Insufficient documentation

## 2022-04-03 LAB — CBG MONITORING, ED: Glucose-Capillary: 94 mg/dL (ref 70–99)

## 2022-04-03 NOTE — ED Triage Notes (Signed)
Pt arrived via POV from home with c/o dizziness w/ emesis x 2 since 2000 [facility] 0  ?

## 2022-04-04 ENCOUNTER — Emergency Department (HOSPITAL_COMMUNITY): Payer: Self-pay

## 2022-04-04 LAB — CBC
HCT: 42.9 % (ref 36.0–46.0)
Hemoglobin: 13.8 g/dL (ref 12.0–15.0)
MCH: 25.8 pg — ABNORMAL LOW (ref 26.0–34.0)
MCHC: 32.2 g/dL (ref 30.0–36.0)
MCV: 80.2 fL (ref 80.0–100.0)
Platelets: 237 10*3/uL (ref 150–400)
RBC: 5.35 MIL/uL — ABNORMAL HIGH (ref 3.87–5.11)
RDW: 14.3 % (ref 11.5–15.5)
WBC: 5.7 10*3/uL (ref 4.0–10.5)
nRBC: 0 % (ref 0.0–0.2)

## 2022-04-04 LAB — BASIC METABOLIC PANEL
Anion gap: 8 (ref 5–15)
BUN: 14 mg/dL (ref 8–23)
CO2: 27 mmol/L (ref 22–32)
Calcium: 9.1 mg/dL (ref 8.9–10.3)
Chloride: 104 mmol/L (ref 98–111)
Creatinine, Ser: 0.9 mg/dL (ref 0.44–1.00)
GFR, Estimated: >60 mL/min (ref >60)
Glucose, Bld: 97 mg/dL (ref 70–99)
Potassium: 3.5 mmol/L (ref 3.5–5.1)
Sodium: 139 mmol/L (ref 135–145)

## 2022-04-04 MED ORDER — MECLIZINE HCL 12.5 MG PO TABS
50.0000 mg | ORAL_TABLET | Freq: Once | ORAL | Status: AC
  Administered 2022-04-04: 50 mg via ORAL
  Filled 2022-04-04: qty 4

## 2022-04-04 MED ORDER — LORAZEPAM 2 MG/ML IJ SOLN
0.5000 mg | Freq: Once | INTRAMUSCULAR | Status: AC
  Administered 2022-04-04: 0.5 mg via INTRAVENOUS
  Filled 2022-04-04: qty 1

## 2022-04-04 MED ORDER — ONDANSETRON 4 MG PO TBDP: 4.0000 mg | ORAL_TABLET | Freq: Three times a day (TID) | ORAL | 0 refills | Status: DC | PRN

## 2022-04-04 MED ORDER — DIAZEPAM 5 MG PO TABS: 5.0000 mg | ORAL_TABLET | Freq: Three times a day (TID) | ORAL | 0 refills | Status: DC | PRN

## 2022-04-04 MED ORDER — SODIUM CHLORIDE 0.9 % IV BOLUS
1000.0000 mL | Freq: Once | INTRAVENOUS | Status: AC
  Administered 2022-04-04: 1000 mL via INTRAVENOUS

## 2022-04-04 MED ORDER — MECLIZINE HCL 25 MG PO TABS: 25.0000 mg | ORAL_TABLET | Freq: Three times a day (TID) | ORAL | 0 refills | Status: DC | PRN

## 2022-04-04 NOTE — ED Provider Notes (Signed)
Pt signed out by Dr. Sedonia Small pending MRI. ? ?MRI brain: ?  ?IMPRESSION:  ?1. No acute finding such as infarct.  ?2. Partial left mastoid opacification with negative nasopharynx.  ?3. Enlarged sella and right Meckel's cave, are there risk factors  ?for chronic elevated intracranial pressure (such as pseudotumor)?  ? ?I reviewed films and agree with the radiologist. ?No evidence of CVA.  Pt has been able to ambulate.  She feels a little dizzy still with head movement, but is feeling much better overall.   No indication for admission.  Plan d/w pt and family member.  Pt is stable for d/c.  She is given a rx for valium, antivert, and zofran odt.  She is given instructions for the Epley maneuver.  She is to return if worse.  F/u with pcp. ? ? ?  ?Rhonda Pence, MD ?04/04/22 [id number] ? ?

## 2022-04-04 NOTE — ED Notes (Signed)
Pt ambulated from room to bathroom, down the hall and back to room ?Pt became dizzy on initial stand but stated it got better as she moved ?

## 2022-04-04 NOTE — ED Provider Notes (Signed)
?[facility]ENCY DEPT ?[facility] Emergency Department ?Provider Note ?MRN:  [id number]  ?Arrival date & time: 04/04/22    ? ?Chief Complaint   ?Dizziness ?  ?History of Present Illness   ?Rhonda Payne is a 66 y.o. year-old female with a history of hypertension presenting to the ED with chief complaint of dizziness. ? ?Patient was going to bed this evening and laid flat and looked up at the ceiling and then experienced sudden onset room spinning sensation.  Tried to get out of bed but could not and fell back down into the bed.  Had 1 episode of vomiting.  Symptoms have resolved but when she lays flat or bends over, the symptoms return.  Denies numbness or weakness to the arms or legs, no vision change, no trouble with speaking, no chest pain or shortness of breath, no abdominal pain. ? ?Review of Systems  ?A thorough review of systems was obtained and all systems are negative except as noted in the HPI and PMH.  ? ?Patient's Health History   ? ?Past Medical History:  ?Diagnosis Date  ? Anemia   ? History of blood transfusion   ? "when I was a child; related to sickle cell trait"  ? Hypertension   ? Sickle cell trait (HCC)   ?  ?Past Surgical History:  ?Procedure Laterality Date  ? CHOLECYSTECTOMY N/A 03/20/2015  ? Procedure: LAPAROSCOPIC CHOLECYSTECTOMY WITH INTRAOPERATIVE CHOLANGIOGRAM;  Surgeon: Ovidio Kin, MD;  Location: [facility] OR;  Service: General;  Laterality: N/A;  ? DILATION AND CURETTAGE OF UTERUS  1990's  ? INCISIONAL HERNIA REPAIR N/A 07/31/2019  ? Procedure: OPEN INCISIONAL HERNIA REPAIR WITH MESH;  Surgeon: Harriette Bouillon, MD;  Location: [facility] OR;  Service: General;  Laterality: N/A;  ? LAPAROSCOPIC CHOLECYSTECTOMY  03/20/2015  ? w/IOC  ? LAPAROSCOPY N/A 07/23/2019  ? Procedure: laparotomy with hernia repair with mesh;  Surgeon: Axel Filler, MD;  Location: [facility] OR;  Service: General;  Laterality: N/A;  ? VAGINAL HYSTERECTOMY  1990's  ?  ?Family History  ?Problem Relation Age of Onset  ? Diabetes Mother    ? Cancer Father   ?     no sure but thinks it's prostate  ?  ?Social History  ? ?Socioeconomic History  ? Marital status: Married  ?  Spouse name: Not on file  ? Number of children: Not on file  ? Years of education: Not on file  ? Highest education level: Not on file  ?Occupational History  ? Not on file  ?Tobacco Use  ? Smoking status: Never  ? Smokeless tobacco: Never  ?Vaping Use  ? Vaping Use: Never used  ?Substance and Sexual Activity  ? Alcohol use: No  ? Drug use: No  ? Sexual activity: Never  ?Other Topics Concern  ? Not on file  ?Social History Narrative  ? Not on file  ? ?Social Determinants of Health  ? ?Financial Resource Strain: Not on file  ?Food Insecurity: Not on file  ?Transportation Needs: Not on file  ?Physical Activity: Not on file  ?Stress: Not on file  ?Social Connections: Not on file  ?Intimate Partner Violence: Not on file  ?  ? ?Physical Exam  ? ?Vitals:  ? 04/04/22 0400 04/04/22 0500  ?BP: 138/84 138/89  ?Pulse: 60 68  ?Resp: 19 18  ?Temp:    ?SpO2: 97% 95%  ?  ?CONSTITUTIONAL: Well-appearing, NAD ?NEURO/PSYCH:  Alert and oriented x 3, normal and symmetric strength and sensation, normal coordination, normal speech ?EYES:  eyes  equal and reactive, normal extraocular movements, no nystagmus ?ENT/NECK:  no LAD, no JVD ?CARDIO: Regular rate, well-perfused, normal S1 and S2 ?PULM:  CTAB no wheezing or rhonchi ?GI/GU:  non-distended, non-tender ?MSK/SPINE:  No gross deformities, no edema ?SKIN:  no rash, atraumatic ? ? ?*Additional and/or pertinent findings included in MDM below ? ?Diagnostic and Interventional Summary  ? ? EKG Interpretation ? ?Date/Time:  Sunday April 03 2022 23:38:33 EDT ?Ventricular Rate:  63 ?PR Interval:  174 ?QRS Duration: 101 ?QT Interval:  386 ?QTC Calculation: 396 ?R Axis:   -20 ?Text Interpretation: Sinus rhythm Ventricular premature complex Borderline left axis deviation Borderline T abnormalities, diffuse leads Confirmed by Kennis Carina [phone]) on 04/04/2022  12:17:15 AM ?  ? ?  ? ?Labs Reviewed  ?CBC - Abnormal; Notable for the following components:  ?    Result Value  ? RBC 5.35 (*)   ? MCH 25.8 (*)   ? All other components within normal limits  ?BASIC METABOLIC PANEL  ?CBG MONITORING, ED  ?  ?MR BRAIN WO CONTRAST    (Results Pending)  ?  ?Medications  ?meclizine (ANTIVERT) tablet 50 mg (50 mg Oral Given 04/04/22 0029)  ?sodium chloride 0.9 % bolus 1,000 mL (0 mLs Intravenous Stopped 04/04/22 0419)  ?  ? ?Procedures  /  Critical Care ?Procedures ? ?ED Course and Medical Decision Making  ?Initial Impression and Ddx ?History seems much more consistent with a benign positional vertigo.  Patient well-appearing without symptoms at this time, normal vital signs.  History of hypertension, otherwise no significant cardiovascular risk factors.  Providing meclizine and will ambulate, anticipate discharge if no significant clinical change. ? ?Past medical/surgical history that increases complexity of ED encounter: Hypertension ? ?Interpretation of Diagnostics ?I personally reviewed the EKG and my interpretation is as follows: Sinus rhythm ?Clinical Course as of 04/04/22 0655  ?Mon Apr 04, 2022  ?0153 Patient ambulated and having persistent dizziness, worse when standing.  This time described more as a lightheadedness.  Obtaining labs, providing fluids.  Will consider MRI in the morning if symptoms persist. [MB]  ?  ?Clinical Course User Index ?[MB] Sabas Sous, MD  ?  ?Persistent dizziness and/or lightheadedness. ? ?Patient Reassessment and Ultimate Disposition/Management ?Awaiting MRI and reevaluation.  Signed out to oncoming provider. ? ?Patient management required discussion with the following services or consulting groups:  None ? ?Complexity of Problems Addressed ?Acute illness or injury that poses threat of life of bodily function ? ?Additional Data Reviewed and Analyzed ?Further history obtained from: ?Further history from spouse/family member ? ?Additional Factors  Impacting ED Encounter Risk ?Consideration of hospitalization ? ?Elmer Sow. Pilar Plate, MD ?[facility] Emergency Medicine ?[facility] ?[email or web address] ? ?Final Clinical Impressions(s) / ED Diagnoses  ? ?  ICD-10-CM   ?1. Dizziness  R42   ?  ?  ?ED Discharge Orders   ? ? None  ? ?  ?  ? ?Discharge Instructions Discussed with and Provided to Patient:  ? ?Discharge Instructions   ?None ?  ? ?  ?Sabas Sous, MD ?04/04/22 [id number] ? ?

## 2023-02-03 ENCOUNTER — Ambulatory Visit (INDEPENDENT_AMBULATORY_CARE_PROVIDER_SITE_OTHER): Payer: Medicare Other | Admitting: Family Medicine

## 2023-02-03 ENCOUNTER — Encounter: Payer: Self-pay | Admitting: Family Medicine

## 2023-02-03 VITALS — BP 162/96 | HR 90 | Ht 65.0 in | Wt 257.0 lb

## 2023-02-03 DIAGNOSIS — Z13820 Encounter for screening for osteoporosis: Secondary | ICD-10-CM

## 2023-02-03 DIAGNOSIS — R7301 Impaired fasting glucose: Secondary | ICD-10-CM

## 2023-02-03 DIAGNOSIS — I10 Essential (primary) hypertension: Secondary | ICD-10-CM

## 2023-02-03 DIAGNOSIS — E039 Hypothyroidism, unspecified: Secondary | ICD-10-CM

## 2023-02-03 DIAGNOSIS — R051 Acute cough: Secondary | ICD-10-CM

## 2023-02-03 DIAGNOSIS — E785 Hyperlipidemia, unspecified: Secondary | ICD-10-CM

## 2023-02-03 DIAGNOSIS — R059 Cough, unspecified: Secondary | ICD-10-CM | POA: Insufficient documentation

## 2023-02-03 DIAGNOSIS — Z1211 Encounter for screening for malignant neoplasm of colon: Secondary | ICD-10-CM

## 2023-02-03 DIAGNOSIS — J3489 Other specified disorders of nose and nasal sinuses: Secondary | ICD-10-CM

## 2023-02-03 DIAGNOSIS — Z1231 Encounter for screening mammogram for malignant neoplasm of breast: Secondary | ICD-10-CM

## 2023-02-03 MED ORDER — AZELASTINE-FLUTICASONE 137-50 MCG/ACT NA SUSP: 1.0000 | Freq: Two times a day (BID) | NASAL | 1 refills | Status: DC

## 2023-02-03 MED ORDER — LISINOPRIL 40 MG PO TABS
40.0000 mg | ORAL_TABLET | Freq: Every day | ORAL | 3 refills | Status: DC
Start: 2023-02-03 — End: 2023-07-28

## 2023-02-03 MED ORDER — HYDROCHLOROTHIAZIDE 25 MG PO TABS: 25.0000 mg | ORAL_TABLET | Freq: Every day | ORAL | 3 refills | Status: DC

## 2023-02-03 MED ORDER — LEVOCETIRIZINE DIHYDROCHLORIDE 5 MG PO TABS: 5.0000 mg | ORAL_TABLET | Freq: Every evening | ORAL | 1 refills | Status: AC

## 2023-02-03 MED ORDER — BENZONATATE 100 MG PO CAPS: 100.0000 mg | ORAL_CAPSULE | Freq: Two times a day (BID) | ORAL | 0 refills | Status: DC | PRN

## 2023-02-03 NOTE — Progress Notes (Signed)
New Patient Office Visit   Subjective   Patient ID: Rhonda Payne, female    DOB: 18-Nov-1956  Age: 67 y.o. MRN: [id number]  CC:  Chief Complaint  Patient presents with   Establish Care   Nasal Congestion    Patient complains of cough, congestion starting 5 days ago.     HPI Rhonda Payne 67 year old female,presents to establish care. She  has a past medical history of Anemia, History of blood transfusion, Hypertension, and Sickle cell trait ([place]).  Patient here for elevated blood pressure. She is not exercising and is not adherent to low salt diet.  Blood pressure is not well controlled at home. Patient denies cardiac symptoms chest pain, chest pressure/discomfort, dyspnea, lower extremity edema, palpitations, syncope, and tachypnea. Cardiovascular risk factors: hypertension, obesity (BMI >= 30 kg/m2), and sedentary lifestyle.    Cough This is a new problem. The current episode started in the past 7 days. The problem has been gradually improving. The problem occurs every few hours. The cough is Non-productive. Associated symptoms include nasal congestion and rhinorrhea. Pertinent negatives include no chest pain, chills, ear congestion, fever, shortness of breath or wheezing. The symptoms are aggravated by cold air. She has tried OTC cough suppressant for the symptoms. The treatment provided mild relief. There is no history of asthma, COPD or pneumonia.    Outpatient Encounter Medications as of 02/03/2023  Medication Sig   Azelastine-Fluticasone 137-50 MCG/ACT SUSP Place 1 spray into the nose every 12 (twelve) hours.   benzonatate (TESSALON) 100 MG capsule Take 1 capsule (100 mg total) by mouth 2 (two) times daily as needed for cough.   Cholecalciferol (VITAMIN D3 PO) Take 1 tablet by mouth daily.    hydrochlorothiazide (HYDRODIURIL) 25 MG tablet Take 1 tablet (25 mg total) by mouth daily.   levocetirizine (XYZAL ALLERGY 24HR) 5 MG tablet Take 1 tablet (5 mg total) by mouth every  evening.   lisinopril (ZESTRIL) 40 MG tablet Take 1 tablet (40 mg total) by mouth daily.   [DISCONTINUED] lisinopril-hydrochlorothiazide (ZESTORETIC) 20-25 MG tablet Take 1 tablet by mouth daily.   diazepam (VALIUM) 5 MG tablet Take 1 tablet (5 mg total) by mouth every 8 (eight) hours as needed (dizziness).   meclizine (ANTIVERT) 25 MG tablet Take 1 tablet (25 mg total) by mouth 3 (three) times daily as needed for dizziness.   methocarbamol (ROBAXIN) 750 MG tablet Take 1 tablet (750 mg total) by mouth every 8 (eight) hours as needed (use for muscle cramps/pain).   ondansetron (ZOFRAN-ODT) 4 MG disintegrating tablet Take 1 tablet (4 mg total) by mouth every 8 (eight) hours as needed for nausea or vomiting.   Vitamin D, Ergocalciferol, (DRISDOL) 1.25 MG (50000 UT) CAPS capsule Take 1 capsule (50,000 Units total) by mouth every 7 (seven) days.   No facility-administered encounter medications on file as of 02/03/2023.    Past Surgical History:  Procedure Laterality Date   CHOLECYSTECTOMY N/A 03/20/2015   Procedure: LAPAROSCOPIC CHOLECYSTECTOMY WITH INTRAOPERATIVE CHOLANGIOGRAM;  Surgeon: Alphonsa Overall, MD;  Location: [place];  Service: General;  Laterality: N/A;   DILATION AND CURETTAGE OF UTERUS  1990's   INCISIONAL HERNIA REPAIR N/A 07/31/2019   Procedure: OPEN INCISIONAL HERNIA REPAIR WITH MESH;  Surgeon: Erroll Luna, MD;  Location: [place];  Service: General;  Laterality: N/A;   LAPAROSCOPIC CHOLECYSTECTOMY  03/20/2015   w/IOC   LAPAROSCOPY N/A 07/23/2019   Procedure: laparotomy with hernia repair with mesh;  Surgeon: Ralene Ok, MD;  Location: [facility]  OR;  Service: General;  Laterality: N/A;   VAGINAL HYSTERECTOMY  1990's    Review of Systems  Constitutional:  Negative for chills and fever.  HENT:  Positive for rhinorrhea.   Respiratory:  Positive for cough. Negative for shortness of breath and wheezing.   Cardiovascular:  Negative for chest pain.      Objective    BP (!) 162/96   Pulse  90   Ht '5\' 5"'$  (1.651 m)   Wt 257 lb (116.6 kg)   SpO2 96%   BMI 42.77 kg/m   Physical Exam Constitutional:      Appearance: She is obese.  Cardiovascular:     Rate and Rhythm: Normal rate and regular rhythm.     Pulses: Normal pulses.     Heart sounds: Normal heart sounds.  Pulmonary:     Effort: Pulmonary effort is normal.     Breath sounds: Normal breath sounds.  Musculoskeletal:        General: No swelling. Normal range of motion.     Right lower leg: No edema.     Left lower leg: No edema.  Skin:    General: Skin is warm and dry.     Capillary Refill: Capillary refill takes less than 2 seconds.  Neurological:     Mental Status: She is alert.     Gait: Gait normal.     Deep Tendon Reflexes: Reflexes normal.  Psychiatric:        Thought Content: Thought content normal.       Assessment & Plan:  Primary hypertension -     CBC with Differential/Platelet -     CMP14+EGFR -     Microalbumin / creatinine urine ratio -     Lisinopril; Take 1 tablet (40 mg total) by mouth daily.  Dispense: 90 tablet; Refill: 3 -     hydroCHLOROthiazide; Take 1 tablet (25 mg total) by mouth daily.  Dispense: 90 tablet; Refill: 3  IFG (impaired fasting glucose) -     Hemoglobin A1c  Hyperlipidemia, unspecified hyperlipidemia type -     Lipid panel  Hypothyroidism, unspecified type -     TSH + free T4  Encounter for screening for osteoporosis -     DG Bone Density; Future  Screening mammogram for breast cancer -     MM Digital Diagnostic Bilat; Future  Screening for colon cancer -     Ambulatory referral to Gastroenterology  Acute cough Assessment & Plan: Mostly likely viral, prescribed xyzal 5 mg, benzonatate 100 mg Explained symptomatic treatment, rest, increase oral fluid intake. Follow-up for worsening or persistent symptoms. Patient verbalizes understanding regarding plan of care and all questions answered   Orders: -     Benzonatate; Take 1 capsule (100 mg total) by  mouth 2 (two) times daily as needed for cough.  Dispense: 20 capsule; Refill: 0  Rhinorrhea -     Levocetirizine Dihydrochloride; Take 1 tablet (5 mg total) by mouth every evening.  Dispense: 15 tablet; Refill: 1 -     Azelastine-Fluticasone; Place 1 spray into the nose every 12 (twelve) hours.  Dispense: 23 g; Refill: 1  Essential hypertension Assessment & Plan: Vitals:   02/03/23 1021 02/03/23 1026  BP: (!) 161/102 (!) 162/96     Blood pressure not well controlled, labs ordered, increased lisinopril 40 mg , continue hydrochlorothiazide 25 mg. Explained the importance of medication compliance. Discussed non pharmacological interventions such as low salt, DASH diet discussed. Educated on stress reduction and physical  activity. Discussed signs and symptoms of major cardiovascular event and need to present to the ED. Follow up in 1 month  or sooner if needed. Patient verbalizes understanding regarding plan of care and all questions answered.      Return in about 4 weeks (around 03/03/2023) for re-check blood pressure, hypertension.   Renard Hamper Ria Comment, FNP

## 2023-02-03 NOTE — Progress Notes (Deleted)
   New Patient Office Visit   Subjective   Patient ID: Rhonda Payne, female    DOB: October 21, 1956  Age: 67 y.o. MRN: [id number]  CC:  Chief Complaint  Patient presents with   Establish Care   Nasal Congestion    Patient complains of cough, congestion starting 5 days ago.     HPI Rhonda Payne 67 year old female,presents to establish care. She  has a past medical history of Anemia, History of blood transfusion, Hypertension, and Sickle cell trait ([place]).    Outpatient Encounter Medications as of 02/03/2023  Medication Sig   Cholecalciferol (VITAMIN D3 PO) Take 1 tablet by mouth daily.    lisinopril-hydrochlorothiazide (ZESTORETIC) 20-25 MG tablet Take 1 tablet by mouth daily.   diazepam (VALIUM) 5 MG tablet Take 1 tablet (5 mg total) by mouth every 8 (eight) hours as needed (dizziness).   meclizine (ANTIVERT) 25 MG tablet Take 1 tablet (25 mg total) by mouth 3 (three) times daily as needed for dizziness.   methocarbamol (ROBAXIN) 750 MG tablet Take 1 tablet (750 mg total) by mouth every 8 (eight) hours as needed (use for muscle cramps/pain).   ondansetron (ZOFRAN-ODT) 4 MG disintegrating tablet Take 1 tablet (4 mg total) by mouth every 8 (eight) hours as needed for nausea or vomiting.   Vitamin D, Ergocalciferol, (DRISDOL) 1.25 MG (50000 UT) CAPS capsule Take 1 capsule (50,000 Units total) by mouth every 7 (seven) days.   No facility-administered encounter medications on file as of 02/03/2023.    Past Surgical History:  Procedure Laterality Date   CHOLECYSTECTOMY N/A 03/20/2015   Procedure: LAPAROSCOPIC CHOLECYSTECTOMY WITH INTRAOPERATIVE CHOLANGIOGRAM;  Surgeon: Alphonsa Overall, MD;  Location: [place];  Service: General;  Laterality: N/A;   DILATION AND CURETTAGE OF UTERUS  1990's   INCISIONAL HERNIA REPAIR N/A 07/31/2019   Procedure: OPEN INCISIONAL HERNIA REPAIR WITH MESH;  Surgeon: Erroll Luna, MD;  Location: [place];  Service: General;  Laterality: N/A;   LAPAROSCOPIC  CHOLECYSTECTOMY  03/20/2015   w/IOC   LAPAROSCOPY N/A 07/23/2019   Procedure: laparotomy with hernia repair with mesh;  Surgeon: Ralene Ok, MD;  Location: [place];  Service: General;  Laterality: N/A;   VAGINAL HYSTERECTOMY  1990's    Review of Systems  Respiratory:  Negative for shortness of breath.   Cardiovascular:  Negative for chest pain.      Objective    BP (!) 162/96   Pulse 90   Ht 5' 5"$  (1.651 m)   Wt 257 lb (116.6 kg)   SpO2 96%   BMI 42.77 kg/m   Physical Exam    Assessment & Plan:  Primary hypertension  IFG (impaired fasting glucose)  Hyperlipidemia, unspecified hyperlipidemia type  Hypothyroidism, unspecified type  Encounter for screening for osteoporosis  Screening mammogram for breast cancer  Screening for colon cancer    No follow-ups on file.   Renard Hamper Ria Comment, FNP

## 2023-02-03 NOTE — Assessment & Plan Note (Addendum)
Vitals:   02/03/23 1021 02/03/23 1026  BP: (!) 161/102 (!) 162/96     Blood pressure not well controlled, labs ordered, increased lisinopril 40 mg , continue hydrochlorothiazide 25 mg. Explained the importance of medication compliance. Discussed non pharmacological interventions such as low salt, DASH diet discussed. Educated on stress reduction and physical activity. Discussed signs and symptoms of major cardiovascular event and need to present to the ED. Follow up in 1 month  or sooner if needed. Patient verbalizes understanding regarding plan of care and all questions answered.

## 2023-02-03 NOTE — Assessment & Plan Note (Signed)
Mostly likely viral, prescribed xyzal 5 mg, benzonatate 100 mg Explained symptomatic treatment, rest, increase oral fluid intake. Follow-up for worsening or persistent symptoms. Patient verbalizes understanding regarding plan of care and all questions answered

## 2023-02-03 NOTE — Patient Instructions (Signed)
It was pleasure meeting with you today. Please take medications as prescribed. Follow up with your primary health provider if any health concerns arises.

## 2023-02-07 ENCOUNTER — Encounter: Payer: Self-pay | Admitting: *Deleted

## 2023-02-27 ENCOUNTER — Encounter: Payer: Self-pay | Admitting: Family Medicine

## 2023-02-28 ENCOUNTER — Encounter: Payer: Self-pay | Admitting: Family Medicine

## 2023-02-28 ENCOUNTER — Ambulatory Visit (INDEPENDENT_AMBULATORY_CARE_PROVIDER_SITE_OTHER): Payer: Medicare Other | Admitting: Family Medicine

## 2023-02-28 VITALS — BP 136/84 | HR 78 | Temp 98.3°F | Ht 65.0 in | Wt 252.0 lb

## 2023-02-28 DIAGNOSIS — I10 Essential (primary) hypertension: Secondary | ICD-10-CM

## 2023-02-28 DIAGNOSIS — R42 Dizziness and giddiness: Secondary | ICD-10-CM | POA: Diagnosis not present

## 2023-02-28 DIAGNOSIS — E559 Vitamin D deficiency, unspecified: Secondary | ICD-10-CM

## 2023-02-28 DIAGNOSIS — R7309 Other abnormal glucose: Secondary | ICD-10-CM

## 2023-02-28 DIAGNOSIS — R002 Palpitations: Secondary | ICD-10-CM

## 2023-02-28 DIAGNOSIS — Z136 Encounter for screening for cardiovascular disorders: Secondary | ICD-10-CM

## 2023-02-28 NOTE — Progress Notes (Signed)
New Patient Office Visit  Subjective   Patient ID: Rhonda Payne, female    DOB: [birth year]  Age: 67 y.o. MRN: [id number]  CC:  Chief Complaint  Patient presents with   New Patient (Initial Visit)    HPI Rhonda Payne presents to establish care Pt is a resident of [place]. Retired.  Hypertension This is a chronic problem. The current episode started more than 1 year ago. The problem is unchanged. The problem is uncontrolled. Associated symptoms include palpitations and peripheral edema. Pertinent negatives include no anxiety, blurred vision, chest pain, headaches, PND, shortness of breath or sweats. There are no associated agents to hypertension. Risk factors for coronary artery disease include sedentary lifestyle. Past treatments include ACE inhibitors. The current treatment provides moderate improvement. There are no compliance problems.   Does not have a BP cuff at home. Has bad eyesight at baseline. Endorses palpitations and heart racing episodes. Denies SOB and swelling in her legs.   Vertigo  Has vertigo episodes a few times a month. Triggered by walking. Was hospitalized in the past due to an acute episode. Feels that it is more controlled   Outpatient Encounter Medications as of 02/28/2023  Medication Sig   Cholecalciferol (VITAMIN D3 PO) Take 1 tablet by mouth daily.    diazepam (VALIUM) 5 MG tablet Take 1 tablet (5 mg total) by mouth every 8 (eight) hours as needed (dizziness).   hydrochlorothiazide (HYDRODIURIL) 25 MG tablet Take 1 tablet (25 mg total) by mouth daily.   levocetirizine (XYZAL ALLERGY 24HR) 5 MG tablet Take 1 tablet (5 mg total) by mouth every evening.   lisinopril (ZESTRIL) 40 MG tablet Take 1 tablet (40 mg total) by mouth daily.   [DISCONTINUED] Azelastine-Fluticasone 137-50 MCG/ACT SUSP Place 1 spray into the nose every 12 (twelve) hours.   [DISCONTINUED] benzonatate (TESSALON) 100 MG capsule Take 1 capsule (100 mg total) by mouth 2 (two) times daily as  needed for cough.   [DISCONTINUED] meclizine (ANTIVERT) 25 MG tablet Take 1 tablet (25 mg total) by mouth 3 (three) times daily as needed for dizziness.   [DISCONTINUED] methocarbamol (ROBAXIN) 750 MG tablet Take 1 tablet (750 mg total) by mouth every 8 (eight) hours as needed (use for muscle cramps/pain).   [DISCONTINUED] ondansetron (ZOFRAN-ODT) 4 MG disintegrating tablet Take 1 tablet (4 mg total) by mouth every 8 (eight) hours as needed for nausea or vomiting.   [DISCONTINUED] Vitamin D, Ergocalciferol, (DRISDOL) 1.25 MG (50000 UT) CAPS capsule Take 1 capsule (50,000 Units total) by mouth every 7 (seven) days.   No facility-administered encounter medications on file as of 02/28/2023.    Past Medical History:  Diagnosis Date   Anemia    History of blood transfusion    "when I was a child; related to sickle cell trait"   Hypertension    Sickle cell trait ([facility])     Past Surgical History:  Procedure Laterality Date   CHOLECYSTECTOMY N/A 03/20/2015   Procedure: LAPAROSCOPIC CHOLECYSTECTOMY WITH INTRAOPERATIVE CHOLANGIOGRAM;  Surgeon: Alphonsa Overall, MD;  Location: [facility];  Service: General;  Laterality: N/A;   DILATION AND CURETTAGE OF UTERUS  1990's   INCISIONAL HERNIA REPAIR N/A 07/31/2019   Procedure: OPEN INCISIONAL HERNIA REPAIR WITH MESH;  Surgeon: Erroll Luna, MD;  Location: [facility];  Service: General;  Laterality: N/A;   LAPAROSCOPIC CHOLECYSTECTOMY  03/20/2015   w/IOC   LAPAROSCOPY N/A 07/23/2019   Procedure: laparotomy with hernia repair with mesh;  Surgeon: Ralene Ok, MD;  Location: [facility];  Service: General;  Laterality: N/A;   VAGINAL HYSTERECTOMY  81's    Family History  Problem Relation Age of Onset   Diabetes Mother    Cancer Father        no sure but thinks it's prostate    Social History   Socioeconomic History   Marital status: Widowed    Spouse name: Not on file   Number of children: Not on file   Years of education: Not on file   Highest education  level: Not on file  Occupational History   Not on file  Tobacco Use   Smoking status: Never   Smokeless tobacco: Never  Vaping Use   Vaping Use: Never used  Substance and Sexual Activity   Alcohol use: No   Drug use: No   Sexual activity: Never  Other Topics Concern   Not on file  Social History Narrative   Not on file   Social Determinants of Health   Financial Resource Strain: Not on file  Food Insecurity: Not on file  Transportation Needs: Not on file  Physical Activity: Not on file  Stress: Not on file  Social Connections: Not on file  Intimate Partner Violence: Not on file    Review of Systems  Eyes:  Negative for blurred vision.  Respiratory:  Negative for shortness of breath.   Cardiovascular:  Positive for palpitations. Negative for chest pain and PND.  Neurological:  Negative for headaches.   Objective   BP 136/84   Pulse 78   Temp 98.3 F (36.8 C)   Ht 5\' 5"  (1.651 m)   Wt 252 lb (114.3 kg)   SpO2 97%   BMI 41.93 kg/m   Physical Exam Constitutional:      General: She is not in acute distress.    Appearance: Normal appearance. She is not ill-appearing, toxic-appearing or diaphoretic.  Cardiovascular:     Rate and Rhythm: Normal rate.     Pulses: Normal pulses.     Heart sounds: Normal heart sounds. No murmur heard.    No gallop.  Pulmonary:     Effort: Pulmonary effort is normal. No respiratory distress.     Breath sounds: Normal breath sounds. No stridor. No wheezing, rhonchi or rales.  Skin:    General: Skin is warm.     Capillary Refill: Capillary refill takes less than 2 seconds.  Neurological:     General: No focal deficit present.     Mental Status: She is alert and oriented to person, place, and time. Mental status is at baseline.     Motor: No weakness.  Psychiatric:        Mood and Affect: Mood normal.        Behavior: Behavior normal.        Thought Content: Thought content normal.        Judgment: Judgment normal.     Assessment & Plan:  1. Primary hypertension Pt not controlled in clinic. Elevated BP today in office. Discussed with patient monitoring BP at home. Provided BP log to patient in clinic today. Instructed pt to take BP first thing in the morning after sitting for 5 minutes with feet flat on the floor, arm at heart level. Discussed with patient options for BP cuff at [facility], [facility], [facility]. Pt verbalized they were able to obtain BP cuff. Pt would like to decrease medications, but she is not at goal at this time. Provided handout on Hypertension management. Will review measurements with patient  at follow up and determine plan for BP management. Pt to return for labs when fasting.  - CBC with Differential/Platelet; Future - CMP14+EGFR; Future  2. Vertigo Pt states that she is somewhat controlled. She declines referral to PT at this time. She has not completed PT in the past for vertigo. Pt understands that valium was given in the acute setting of vertigo and is not for long term use.   3. Vitamin D deficiency Pt previously had decreased Vitamin D level per chart review (09/25/2019) without follow up. Will follow with labs as below. Pt is taking OTC supplementation.  - VITAMIN D 25 Hydroxy (Vit-D Deficiency, Fractures); Future  4. Elevated glucose level Elevated glucose level on previous labs. Will monitor with A1c as below.  - Bayer DCA Hb A1c Waived; Future  5. Palpitation Labs as below. Will communicate results to patient once available.  - TSH; Future  6. Encounter for screening for cardiovascular disorders Labs as below. Will communicate results to patient once available.  Not fasting today. Pt will return later this week when she is fasting.  - Lipid panel; Future  The above assessment and management plan was discussed with the patient. The patient verbalized understanding of and has agreed to the management plan using shared-decision making. Patient is aware to call the  clinic if they develop any new symptoms or if symptoms fail to improve or worsen. Patient is aware when to return to the clinic for a follow-up visit. Patient educated on when it is appropriate to go to the emergency department.   Follow up 1 month for CPE   Donzetta Kohut, DNP-FNP [facility] [address] [place], [place] [address] [phone]

## 2023-03-03 ENCOUNTER — Ambulatory Visit: Payer: Medicare Other | Admitting: Family Medicine

## 2023-03-29 ENCOUNTER — Encounter: Payer: Medicare Other | Admitting: Family Medicine

## 2023-04-06 ENCOUNTER — Other Ambulatory Visit: Payer: Medicare Other

## 2023-04-06 DIAGNOSIS — I10 Essential (primary) hypertension: Secondary | ICD-10-CM

## 2023-04-06 DIAGNOSIS — E559 Vitamin D deficiency, unspecified: Secondary | ICD-10-CM

## 2023-04-06 DIAGNOSIS — Z136 Encounter for screening for cardiovascular disorders: Secondary | ICD-10-CM

## 2023-04-06 DIAGNOSIS — R7309 Other abnormal glucose: Secondary | ICD-10-CM

## 2023-04-06 DIAGNOSIS — R002 Palpitations: Secondary | ICD-10-CM

## 2023-04-06 LAB — CBC WITH DIFFERENTIAL/PLATELET

## 2023-04-06 LAB — BAYER DCA HB A1C WAIVED: HB A1C (BAYER DCA - WAIVED): 5.6 % (ref 4.8–5.6)

## 2023-04-07 LAB — CBC WITH DIFFERENTIAL/PLATELET
Basophils Absolute: 0 10*3/uL (ref 0.0–0.2)
Basos: 1 %
EOS (ABSOLUTE): 0.1 10*3/uL (ref 0.0–0.4)
Eos: 1 %
Hematocrit: 41.4 % (ref 34.0–46.6)
Hemoglobin: 13.5 g/dL (ref 11.1–15.9)
Immature Grans (Abs): 0 10*3/uL (ref 0.0–0.1)
Immature Granulocytes: 0 %
Lymphocytes Absolute: 2.1 10*3/uL (ref 0.7–3.1)
Lymphs: 38 %
MCH: 26.3 pg — ABNORMAL LOW (ref 26.6–33.0)
MCHC: 32.6 g/dL (ref 31.5–35.7)
MCV: 81 fL (ref 79–97)
Monocytes Absolute: 0.3 10*3/uL (ref 0.1–0.9)
Monocytes: 6 %
Neutrophils Absolute: 3 10*3/uL (ref 1.4–7.0)
Neutrophils: 54 %
Platelets: 225 10*3/uL (ref 150–450)
RBC: 5.13 x10E6/uL (ref 3.77–5.28)
RDW: 14.3 % (ref 11.7–15.4)
WBC: 5.6 10*3/uL (ref 3.4–10.8)

## 2023-04-07 LAB — CMP14+EGFR
ALT: 14 IU/L (ref 0–32)
AST: 21 IU/L (ref 0–40)
Albumin/Globulin Ratio: 1.3 (ref 1.2–2.2)
Albumin: 4.2 g/dL (ref 3.9–4.9)
Alkaline Phosphatase: 65 IU/L (ref 44–121)
BUN/Creatinine Ratio: 13 (ref 12–28)
BUN: 14 mg/dL (ref 8–27)
Bilirubin Total: 1 mg/dL (ref 0.0–1.2)
CO2: 27 mmol/L (ref 20–29)
Calcium: 9.8 mg/dL (ref 8.7–10.3)
Chloride: 102 mmol/L (ref 96–106)
Creatinine, Ser: 1.04 mg/dL — ABNORMAL HIGH (ref 0.57–1.00)
Globulin, Total: 3.3 g/dL (ref 1.5–4.5)
Glucose: 89 mg/dL (ref 70–99)
Potassium: 4.5 mmol/L (ref 3.5–5.2)
Sodium: 142 mmol/L (ref 134–144)
Total Protein: 7.5 g/dL (ref 6.0–8.5)
eGFR: 60 mL/min/{1.73_m2} (ref >59)

## 2023-04-07 LAB — TSH: TSH: 2.18 u[IU]/mL (ref 0.450–4.500)

## 2023-04-07 LAB — LIPID PANEL
Chol/HDL Ratio: 3.7 ratio (ref 0.0–4.4)
Cholesterol, Total: 186 mg/dL (ref 100–199)
HDL: 50 mg/dL (ref >39)
LDL Chol Calc (NIH): 118 mg/dL — ABNORMAL HIGH (ref 0–99)
Triglycerides: 100 mg/dL (ref 0–149)
VLDL Cholesterol Cal: 18 mg/dL (ref 5–40)

## 2023-04-07 LAB — VITAMIN D 25 HYDROXY (VIT D DEFICIENCY, FRACTURES): Vit D, 25-Hydroxy: 31.2 ng/mL (ref 30.0–100.0)

## 2023-05-02 NOTE — Patient Instructions (Incomplete)
Our records indicate that you are due for your annual mammogram/breast imaging. While there is no way to prevent breast cancer, early detection provides the best opportunity for curing it. For women over the age of 42, the American Cancer Society recommends a yearly clinical breast exam and a yearly mammogram. These practices have saved thousands of lives. We need your help to ensure that you are receiving optimal medical care. Please call the imaging location that has done you previous mammograms. Please remember to list [place] as your primary care. This helps make sure we receive a report and can update your chart.  Below is the contact information for several local breast imaging centers. You may call the location that works best for you, and they will be happy to assistance in making you an appointment. You do not need an order for a regular screening mammogram. However, if you are having any problems or concerns with you breast area, please let your primary care provider know, and appropriate orders will be placed. Please let our office know if you have any questions or concerns. Or if you need information for another imaging center not on this list or outside of the area. We are commented to working with you on your health care journey.   The mobile unit/bus ([facility]) - they come twice a month to our location.  These appointments can be made through our office or by call The Breast Center  [facility]  [address] [place], [place] [address] Phone [phone]  [facility] Radiology Department  [address] [place], [place] [address] [phone]  [facility]  [phone] [address], [place] [address] [phone]  [facility] - [facility]  [address] [place] [place] [address] [phone]  [facility] - [facility]  [address] [place] [address] [phone]  [facility] Mammography in [place]  [address] [place], [place] [address] [phone]  [facility] [address] [place], [place] [address] [phone]  [facility] [address] [place], [place] [address] [phone]  [facility] [place], [place] [address] [phone]

## 2023-05-04 ENCOUNTER — Ambulatory Visit (INDEPENDENT_AMBULATORY_CARE_PROVIDER_SITE_OTHER): Payer: Medicare Other | Admitting: Family Medicine

## 2023-05-04 ENCOUNTER — Encounter: Payer: Self-pay | Admitting: Family Medicine

## 2023-05-04 VITALS — BP 141/85 | HR 72 | Temp 97.4°F | Ht 65.0 in | Wt 253.0 lb

## 2023-05-04 DIAGNOSIS — Z0000 Encounter for general adult medical examination without abnormal findings: Secondary | ICD-10-CM

## 2023-05-04 DIAGNOSIS — Z0001 Encounter for general adult medical examination with abnormal findings: Secondary | ICD-10-CM

## 2023-05-04 DIAGNOSIS — I10 Essential (primary) hypertension: Secondary | ICD-10-CM

## 2023-05-04 NOTE — Progress Notes (Signed)
Rhonda Payne is a 67 y.o. female presents to office today for annual physical exam examination.    Concerns today include: 1. Blood pressure monitor upper arm BP at home average 150/90, 140/80, does not check it often  ROS Denies anxiety, fatigue, peripheral edema, changes to vision, chest pain, headaches, palpitations, sweats, SOB, PND, orthopnea, neck pain,  Meds lisinopril, HCTZ  CAD risks  Recent eye exam and got new glasses.   Occupation: Retired, stay at home mom  Marital status: widowed, 2020  Diet: skips breakfast, vegetarian diet, kale, spinach, cabbage, whole wheat. Still eats sweets, but is trying to reduce it.  Exercise: Treadmill  Substance use: none  Last eye exam: UTD  Last dental exam: none  Last colonoscopy: would like to delay  Last mammogram: would like to delay  Last pap smear: would like to delay  Contraceptive: post menopausal  Refills needed today: none  Other specialists seen: eye  Fasting today:  no  Immunizations needed: Flu Vaccine: no  Tdap Vaccine: due   - every 59yrs - (<3 lifetime doses or unknown): all wounds -- look up need for Tetanus IG - (>=3 lifetime doses): clean/minor wound if >41yrs from previous; all other wounds if >103yrs from previous Zoster Vaccine: declines (those >50yo, once) Pneumonia Vaccine: declines  (those w/ risk factors) - (<19yr) Both: Immunocompromised, cochlear implant, CSF leak, asplenic, sickle cell, Chronic Renal Failure - (<3yr) PPSV-23 only: Heart dz, lung disease, DM, tobacco abuse, alcoholism, cirrhosis/liver disease. - (>40yr): PPSV13 then PPSV23 in 6-12mths;  - (>17yr): repeat PPSV23 once if pt received prior to 65yo and 1yrs have passed   Past Medical History:  Diagnosis Date   Anemia    History of blood transfusion    "when I was a child; related to sickle cell trait"   Hypertension    Sickle cell trait ([facility])    Social History   Socioeconomic History   Marital status: Widowed    Spouse name:  Not on file   Number of children: Not on file   Years of education: Not on file   Highest education level: Not on file  Occupational History   Not on file  Tobacco Use   Smoking status: Never   Smokeless tobacco: Never  Vaping Use   Vaping Use: Never used  Substance and Sexual Activity   Alcohol use: No   Drug use: No   Sexual activity: Never  Other Topics Concern   Not on file  Social History Narrative   Not on file   Social Determinants of Health   Financial Resource Strain: Not on file  Food Insecurity: Not on file  Transportation Needs: Not on file  Physical Activity: Not on file  Stress: Not on file  Social Connections: Not on file  Intimate Partner Violence: Not on file   Past Surgical History:  Procedure Laterality Date   CHOLECYSTECTOMY N/A 03/20/2015   Procedure: LAPAROSCOPIC CHOLECYSTECTOMY WITH INTRAOPERATIVE CHOLANGIOGRAM;  Surgeon: Ovidio Kin, MD;  Location: [facility] OR;  Service: General;  Laterality: N/A;   DILATION AND CURETTAGE OF UTERUS  1990's   INCISIONAL HERNIA REPAIR N/A 07/31/2019   Procedure: OPEN INCISIONAL HERNIA REPAIR WITH MESH;  Surgeon: Harriette Bouillon, MD;  [facility];  Service: General;  Laterality: N/A;   LAPAROSCOPIC CHOLECYSTECTOMY  03/20/2015   w/IOC   LAPAROSCOPY N/A 07/23/2019   Procedure: laparotomy with hernia repair with mesh;  Surgeon: Axel Filler, MD;  Location: [facility] OR;  Service: General;  Laterality: N/A;  VAGINAL HYSTERECTOMY  1990's   Family History  Problem Relation Age of Onset   Diabetes Mother    Cancer Father        no sure but thinks it's prostate    Current Outpatient Medications:    Cholecalciferol (VITAMIN D3 PO), Take 1 tablet by mouth daily. , Disp: , Rfl:    hydrochlorothiazide (HYDRODIURIL) 25 MG tablet, Take 1 tablet (25 mg total) by mouth daily., Disp: 90 tablet, Rfl: 3   levocetirizine (XYZAL ALLERGY 24HR) 5 MG tablet, Take 1 tablet (5 mg total) by mouth every evening., Disp: 15 tablet, Rfl: 1    lisinopril (ZESTRIL) 40 MG tablet, Take 1 tablet (40 mg total) by mouth daily., Disp: 90 tablet, Rfl: 3  Allergies  Allergen Reactions   Aspirin Rash and Other (See Comments)    Caused pain and rash     ROS: Review of Systems Review of Systems  All other systems reviewed and are negative.     Physical exam    05/04/2023   10:25 AM 05/04/2023   10:20 AM 02/28/2023   10:53 AM  Vitals with BMI  Height  5\' 5"    Weight  253 lbs   BMI  42.1   Systolic 141 156 161  Diastolic 85 91 84  Pulse  72     Physical Exam       05/04/2023   10:20 AM 02/28/2023   10:54 AM 02/03/2023   10:23 AM  Depression screen PHQ 2/9  Decreased Interest 0 0 0  Down, Depressed, Hopeless 0 0 0  PHQ - 2 Score 0 0 0  Altered sleeping  0 0  Tired, decreased energy  0 0  Change in appetite  0 0  Feeling bad or failure about yourself   0 0  Trouble concentrating  0 0  Moving slowly or fidgety/restless  0 0  Suicidal thoughts  0 0  PHQ-9 Score  0 0  Difficult doing work/chores  Not difficult at all Not difficult at all      02/28/2023   10:54 AM 02/03/2023   10:23 AM 09/25/2019    1:49 PM  GAD 7 : Generalized Anxiety Score  Nervous, Anxious, on Edge 0 0 0  Control/stop worrying 0 0 0  Worry too much - different things 0 0 0  Trouble relaxing 0 0 0  Restless 0 0 0  Easily annoyed or irritable 0 0 0  Afraid - awful might happen 0 0 0  Total GAD 7 Score 0 0 0  Anxiety Difficulty Not difficult at all Not difficult at all Not difficult at all   Assessment/ Plan: Rhonda Payne here for annual physical exam.  1. Routine general medical examination at a health care facility Discussed with patient to continue healthy lifestyle choices, including diet (rich in fruits, vegetables, and lean proteins, and low in salt and simple carbohydrates) and exercise (at least 30 minutes of moderate physical activity daily). Limit beverages high is sugar. Recommended at least 80-100 oz of water daily.  Patient  declined multiple HM items today.   2. Primary hypertension Not well controlled in office today or at home per report of patient. Patient declined adjustment in dose of medication at this time. Instructed patient to follow up for BP in 2 months with fasting labs. Instructed patient to monitor BP at home daily and keep a log.   Patient to follow up in 2 months with fasting labs for BP   The above  assessment and management plan was discussed with the patient. The patient verbalized understanding of and has agreed to the management plan. Patient is aware to call the clinic if symptoms persist or worsen. Patient is aware when to return to the clinic for a follow-up visit. Patient educated on when it is appropriate to go to the emergency department.   Neale Burly, DNP-FNP [facility] [address] [place], [place] [address] [phone]

## 2023-07-18 ENCOUNTER — Encounter: Payer: Self-pay | Admitting: *Deleted

## 2023-07-21 ENCOUNTER — Emergency Department (HOSPITAL_COMMUNITY)
Admission: EM | Admit: 2023-07-21 | Discharge: 2023-07-22 | Disposition: A | Discharge status: Other Acute Inpatient Hospital | Payer: Medicare Other | Source: Home / Self Care | Attending: Emergency Medicine | Admitting: Emergency Medicine

## 2023-07-21 ENCOUNTER — Emergency Department (HOSPITAL_COMMUNITY): Payer: Medicare Other

## 2023-07-21 ENCOUNTER — Encounter (HOSPITAL_COMMUNITY): Payer: Self-pay | Admitting: *Deleted

## 2023-07-21 ENCOUNTER — Other Ambulatory Visit: Payer: Self-pay

## 2023-07-21 DIAGNOSIS — K56609 Unspecified intestinal obstruction, unspecified as to partial versus complete obstruction: Secondary | ICD-10-CM

## 2023-07-21 DIAGNOSIS — K7689 Other specified diseases of liver: Secondary | ICD-10-CM | POA: Diagnosis not present

## 2023-07-21 DIAGNOSIS — K436 Other and unspecified ventral hernia with obstruction, without gangrene: Secondary | ICD-10-CM

## 2023-07-21 DIAGNOSIS — Z79899 Other long term (current) drug therapy: Secondary | ICD-10-CM | POA: Insufficient documentation

## 2023-07-21 DIAGNOSIS — I10 Essential (primary) hypertension: Secondary | ICD-10-CM | POA: Diagnosis not present

## 2023-07-21 DIAGNOSIS — K862 Cyst of pancreas: Secondary | ICD-10-CM | POA: Diagnosis not present

## 2023-07-21 DIAGNOSIS — K439 Ventral hernia without obstruction or gangrene: Secondary | ICD-10-CM | POA: Diagnosis not present

## 2023-07-21 DIAGNOSIS — R109 Unspecified abdominal pain: Secondary | ICD-10-CM | POA: Diagnosis present

## 2023-07-21 LAB — CBC
HCT: 42.5 % (ref 36.0–46.0)
Hemoglobin: 13.5 g/dL (ref 12.0–15.0)
MCH: 25.8 pg — ABNORMAL LOW (ref 26.0–34.0)
MCHC: 31.8 g/dL (ref 30.0–36.0)
MCV: 81.3 fL (ref 80.0–100.0)
Platelets: 206 10*3/uL (ref 150–400)
RBC: 5.23 MIL/uL — ABNORMAL HIGH (ref 3.87–5.11)
RDW: 14 % (ref 11.5–15.5)
WBC: 6 10*3/uL (ref 4.0–10.5)
nRBC: 0 % (ref 0.0–0.2)

## 2023-07-21 LAB — COMPREHENSIVE METABOLIC PANEL
ALT: 16 U/L (ref 0–44)
AST: 24 U/L (ref 15–41)
Albumin: 3.7 g/dL (ref 3.5–5.0)
Alkaline Phosphatase: 49 U/L (ref 38–126)
Anion gap: 11 (ref 5–15)
BUN: 12 mg/dL (ref 8–23)
CO2: 26 mmol/L (ref 22–32)
Calcium: 9.1 mg/dL (ref 8.9–10.3)
Chloride: 102 mmol/L (ref 98–111)
Creatinine, Ser: 0.95 mg/dL (ref 0.44–1.00)
GFR, Estimated: >60 mL/min (ref >60)
Glucose, Bld: 125 mg/dL — ABNORMAL HIGH (ref 70–99)
Potassium: 3.6 mmol/L (ref 3.5–5.1)
Sodium: 139 mmol/L (ref 135–145)
Total Bilirubin: 1.5 mg/dL — ABNORMAL HIGH (ref 0.3–1.2)
Total Protein: 7.7 g/dL (ref 6.5–8.1)

## 2023-07-21 LAB — LIPASE, BLOOD: Lipase: 21 U/L (ref 11–51)

## 2023-07-21 MED ORDER — ONDANSETRON HCL 4 MG/2ML IJ SOLN
4.0000 mg | Freq: Once | INTRAMUSCULAR | Status: AC
  Administered 2023-07-21: 4 mg via INTRAVENOUS
  Filled 2023-07-21: qty 2

## 2023-07-21 MED ORDER — MORPHINE SULFATE (PF) 4 MG/ML IV SOLN
4.0000 mg | Freq: Once | INTRAVENOUS | Status: AC
  Administered 2023-07-21: 4 mg via INTRAVENOUS
  Filled 2023-07-21: qty 1

## 2023-07-21 MED ORDER — IOHEXOL 300 MG/ML  SOLN
100.0000 mL | Freq: Once | INTRAMUSCULAR | Status: AC | PRN
  Administered 2023-07-21: 100 mL via INTRAVENOUS

## 2023-07-21 MED ORDER — LACTATED RINGERS IV BOLUS
1000.0000 mL | Freq: Once | INTRAVENOUS | Status: AC
  Administered 2023-07-21: 1000 mL via INTRAVENOUS

## 2023-07-21 NOTE — ED Provider Notes (Signed)
[place] EMERGENCY DEPARTMENT AT [facility] Provider Note   CSN: [id number] Arrival date & time: 07/21/23  1653     History {Add pertinent medical, surgical, social history, OB history to HPI:1} Chief Complaint  Patient presents with   Abdominal Pain    Rhonda Payne is a 67 y.o. female.  67 year old female with a history of hypertension who presents to the emergency department with abdominal pain and nausea and vomiting.  Reports that she had periumbilical abdominal pain that started yesterday.  Has difficulty characterizing but says it is severe.  Has also been having green emesis frequently since then.  Has been having diarrhea as well that she says is brown but no bloody stools.  Has a history of a hysterectomy, hernia repair x 2, and cholecystectomy.  No alcohol or marijuana use.       Home Medications Prior to Admission medications   Medication Sig Start Date End Date Taking? Authorizing Provider  Cholecalciferol (VITAMIN D3 PO) Take 1 tablet by mouth daily.     [provider]  hydrochlorothiazide (HYDRODIURIL) 25 MG tablet Take 1 tablet (25 mg total) by mouth daily. 02/03/23   Del Nigel Berthold, FNP  levocetirizine (XYZAL ALLERGY 24HR) 5 MG tablet Take 1 tablet (5 mg total) by mouth every evening. 02/03/23   Del Newman Nip, Tenna Child, FNP  lisinopril (ZESTRIL) 40 MG tablet Take 1 tablet (40 mg total) by mouth daily. 02/03/23   Del Nigel Berthold, FNP      Allergies    Hydrocodone-acetaminophen and Aspirin    Review of Systems   Review of Systems  Physical Exam Updated Vital Signs BP (!) 143/104   Pulse 69   Temp 97.7 F (36.5 C) (Oral)   Resp 20   Ht 5\' 5"  (1.651 m)   Wt 111.1 kg   SpO2 98%   BMI 40.77 kg/m  Physical Exam Vitals and nursing note reviewed.  Constitutional:      General: She is not in acute distress.    Appearance: She is well-developed.  HENT:     Head: Normocephalic and atraumatic.     Right Ear:  External ear normal.     Left Ear: External ear normal.     Nose: Nose normal.  Eyes:     Extraocular Movements: Extraocular movements intact.     Conjunctiva/sclera: Conjunctivae normal.     Pupils: Pupils are equal, round, and reactive to light.  Cardiovascular:     Rate and Rhythm: Normal rate and regular rhythm.     Heart sounds: No murmur heard. Pulmonary:     Effort: Pulmonary effort is normal. No respiratory distress.     Breath sounds: Normal breath sounds.  Abdominal:     General: Abdomen is flat. There is no distension.     Palpations: Abdomen is soft. There is no mass.     Tenderness: There is abdominal tenderness (Mild, diffuse). There is no guarding.     Comments: Somewhat limited 2/2 habitus  Musculoskeletal:     Cervical back: Normal range of motion and neck supple.  Skin:    General: Skin is warm and dry.  Neurological:     Mental Status: She is alert and oriented to person, place, and time. Mental status is at baseline.  Psychiatric:        Mood and Affect: Mood normal.     ED Results / Procedures / Treatments   Labs (all labs ordered are listed, but only abnormal results  are displayed) Labs Reviewed  COMPREHENSIVE METABOLIC PANEL - Abnormal; Notable for the following components:      Result Value   Glucose, Bld 125 (*)    Total Bilirubin 1.5 (*)    All other components within normal limits  CBC - Abnormal; Notable for the following components:   RBC 5.23 (*)    MCH 25.8 (*)    All other components within normal limits  LIPASE, BLOOD  URINALYSIS, ROUTINE W REFLEX MICROSCOPIC    EKG None  Radiology No results found.  Procedures Procedures  {Document cardiac monitor, telemetry assessment procedure when appropriate:1}  Medications Ordered in ED Medications - No data to display  ED Course/ Medical Decision Making/ A&P   {   Click here for ABCD2, HEART and other calculatorsREFRESH Note before signing :1}                              Medical  Decision Making Amount and/or Complexity of Data Reviewed Labs: ordered. Radiology: ordered.  Risk Prescription drug management.   ***  {Document critical care time when appropriate:1} {Document review of labs and clinical decision tools ie heart score, Chads2Vasc2 etc:1}  {Document your independent review of radiology images, and any outside records:1} {Document your discussion with family members, caretakers, and with consultants:1} {Document social determinants of health affecting pt's care:1} {Document your decision making why or why not admission, treatments were needed:1} Final Clinical Impression(s) / ED Diagnoses Final diagnoses:  None    Rx / DC Orders ED Discharge Orders     None

## 2023-07-21 NOTE — ED Triage Notes (Signed)
Pt with abd pain with N/V/D since yesterday.  Denies fevers or chills.

## 2023-07-22 ENCOUNTER — Emergency Department (HOSPITAL_COMMUNITY): Payer: Medicare Other

## 2023-07-22 DIAGNOSIS — R140 Abdominal distension (gaseous): Secondary | ICD-10-CM | POA: Diagnosis not present

## 2023-07-22 DIAGNOSIS — K56601 Complete intestinal obstruction, unspecified as to cause: Secondary | ICD-10-CM | POA: Diagnosis not present

## 2023-07-22 DIAGNOSIS — I10 Essential (primary) hypertension: Secondary | ICD-10-CM | POA: Diagnosis not present

## 2023-07-22 DIAGNOSIS — Z79899 Other long term (current) drug therapy: Secondary | ICD-10-CM | POA: Diagnosis not present

## 2023-07-22 DIAGNOSIS — K56600 Partial intestinal obstruction, unspecified as to cause: Secondary | ICD-10-CM | POA: Diagnosis not present

## 2023-07-22 DIAGNOSIS — K56609 Unspecified intestinal obstruction, unspecified as to partial versus complete obstruction: Secondary | ICD-10-CM

## 2023-07-22 DIAGNOSIS — K436 Other and unspecified ventral hernia with obstruction, without gangrene: Secondary | ICD-10-CM | POA: Diagnosis not present

## 2023-07-22 DIAGNOSIS — Z4682 Encounter for fitting and adjustment of non-vascular catheter: Secondary | ICD-10-CM | POA: Diagnosis not present

## 2023-07-22 HISTORY — DX: Unspecified intestinal obstruction, unspecified as to partial versus complete obstruction: K56.609

## 2023-07-22 MED ORDER — FENTANYL CITRATE PF 50 MCG/ML IJ SOSY: 12.5000 ug | PREFILLED_SYRINGE | INTRAMUSCULAR | Status: DC | PRN

## 2023-07-22 MED ORDER — POTASSIUM CHLORIDE IN NACL 20-0.9 MEQ/L-% IV SOLN
INTRAVENOUS | Status: DC
  Filled 2023-07-22: qty 1000

## 2023-07-22 MED ORDER — ONDANSETRON HCL 4 MG/2ML IJ SOLN: 4.0000 mg | Freq: Four times a day (QID) | INTRAMUSCULAR | Status: DC | PRN

## 2023-07-22 MED ORDER — METOCLOPRAMIDE HCL 5 MG/ML IJ SOLN
10.0000 mg | Freq: Once | INTRAMUSCULAR | Status: AC
  Administered 2023-07-22: 10 mg via INTRAVENOUS
  Filled 2023-07-22: qty 2

## 2023-07-22 MED ORDER — LIDOCAINE VISCOUS HCL 2 % MT SOLN
15.0000 mL | Freq: Once | OROMUCOSAL | Status: AC
  Administered 2023-07-22: 15 mL via OROMUCOSAL
  Filled 2023-07-22: qty 15

## 2023-07-22 MED ORDER — DROPERIDOL 2.5 MG/ML IJ SOLN
0.6250 mg | Freq: Once | INTRAMUSCULAR | Status: AC
  Administered 2023-07-22: 0.625 mg via INTRAVENOUS
  Filled 2023-07-22: qty 2

## 2023-07-22 MED ORDER — LORAZEPAM 2 MG/ML IJ SOLN
1.0000 mg | Freq: Once | INTRAMUSCULAR | Status: AC
  Administered 2023-07-22: 1 mg via INTRAVENOUS
  Filled 2023-07-22: qty 1

## 2023-07-22 NOTE — ED Provider Notes (Signed)
12:09 AM Assumed care from Dr. Eloise Harman, please see their note for full history, physical and decision making until this point. In brief this is a 67 y.o. year old female who presented to the ED tonight with Abdominal Pain     Previous ventral hernia repair and now recurrent hernia with SBO. [facility] and [facility] both think patient needs to go to tertiary care 2/2 complexity of the surgery. [facility] aware. NG being placed. If no bed or not accepted at [facility], [facility] is aware and will admit for medical treatment here pending transfer.   D/w Dr. Fredricka Bonine at [facility], accepts to the ED for evaluation. Patient updated and consents. Appears well. Still mildly distended and ttp but not severely so, states she feels a bit better after that pain meds. Still working on NGT.     Labs, studies and imaging reviewed by myself and considered in medical decision making if ordered. Imaging interpreted by radiology.  Labs Reviewed  COMPREHENSIVE METABOLIC PANEL - Abnormal; Notable for the following components:      Result Value   Glucose, Bld 125 (*)    Total Bilirubin 1.5 (*)    All other components within normal limits  CBC - Abnormal; Notable for the following components:   RBC 5.23 (*)    MCH 25.8 (*)    All other components within normal limits  LIPASE, BLOOD  URINALYSIS, ROUTINE W REFLEX MICROSCOPIC    CT ABDOMEN PELVIS W CONTRAST  Final Result      No follow-ups on file.    , Barbara Cower, MD 07/22/23 [phone]

## 2023-07-25 ENCOUNTER — Telehealth: Payer: Self-pay

## 2023-07-25 NOTE — Transitions of Care (Post Inpatient/ED Visit) (Signed)
   07/25/2023  Name: Rhonda Payne MRN: [id number] DOB: Feb 16, 1956  Today's TOC FU Call Status: Today's TOC FU Call Status:: Successful TOC FU Call Completed TOC FU Call Complete Date: 07/25/23  Transition Care Management Follow-up Telephone Call Date of Discharge: 07/24/23 Discharge Facility: Other ([facility]) Name of Other ([facility]) Discharge Facility: [facility] Type of Discharge: Inpatient Admission Primary Inpatient Discharge Diagnosis:: intestinal obstruction How have you been since you were released from the hospital?: Better Any questions or concerns?: No  Items Reviewed: Did you receive and understand the discharge instructions provided?: Yes Medications obtained,verified, and reconciled?: Yes (Medications Reviewed) Any new allergies since your discharge?: No Dietary orders reviewed?: Yes Do you have support at home?: Yes People in Home: child(ren), adult  Medications Reviewed Today: Medications Reviewed Today     Reviewed by Karena Addison, LPN (Licensed Practical Nurse) on 07/25/23 at 1204  Med List Status: <None>   Medication Order Taking? Sig Documenting Provider Last Dose Status Informant  Cholecalciferol (VITAMIN D3 PO) [id number] No Take 1 tablet by mouth daily.  [provider] Taking Active Self  hydrochlorothiazide (HYDRODIURIL) 25 MG tablet [id number] No Take 1 tablet (25 mg total) by mouth daily. Del Newman Nip, Tenna Child, FNP Taking Active   levocetirizine (XYZAL ALLERGY 24HR) 5 MG tablet [id number] No Take 1 tablet (5 mg total) by mouth every evening. Del Newman Nip, Tenna Child, FNP Taking Active   lisinopril (ZESTRIL) 40 MG tablet [id number] No Take 1 tablet (40 mg total) by mouth daily. Del Nigel Berthold, FNP Taking Active             Home Care and Equipment/Supplies: Were Home Health Services Ordered?: NA Any new equipment or medical supplies ordered?: NA  Functional Questionnaire: Do you need assistance with bathing/showering or  dressing?: No Do you need assistance with meal preparation?: No Do you need assistance with eating?: No Do you have difficulty maintaining continence: No Do you need assistance with getting out of bed/getting out of a chair/moving?: No Do you have difficulty managing or taking your medications?: No  Follow up appointments reviewed: PCP Follow-up appointment confirmed?: Yes Date of PCP follow-up appointment?: 07/28/23 Follow-up Provider: [facility] Follow-up appointment confirmed?: NA Do you need transportation to your follow-up appointment?: No Do you understand care options if your condition(s) worsen?: Yes-patient verbalized understanding    SIGNATURE Karena Addison, LPN [facility] Nurse Health Advisor Direct Dial [phone]

## 2023-07-28 ENCOUNTER — Ambulatory Visit (INDEPENDENT_AMBULATORY_CARE_PROVIDER_SITE_OTHER): Payer: Medicare Other | Admitting: Family Medicine

## 2023-07-28 ENCOUNTER — Encounter: Payer: Self-pay | Admitting: Family Medicine

## 2023-07-28 VITALS — BP 146/82 | HR 78 | Ht 65.0 in | Wt 244.0 lb

## 2023-07-28 DIAGNOSIS — K862 Cyst of pancreas: Secondary | ICD-10-CM

## 2023-07-28 DIAGNOSIS — E785 Hyperlipidemia, unspecified: Secondary | ICD-10-CM | POA: Diagnosis not present

## 2023-07-28 DIAGNOSIS — K436 Other and unspecified ventral hernia with obstruction, without gangrene: Secondary | ICD-10-CM

## 2023-07-28 DIAGNOSIS — I700 Atherosclerosis of aorta: Secondary | ICD-10-CM

## 2023-07-28 DIAGNOSIS — N281 Cyst of kidney, acquired: Secondary | ICD-10-CM | POA: Diagnosis not present

## 2023-07-28 DIAGNOSIS — Z8719 Personal history of other diseases of the digestive system: Secondary | ICD-10-CM | POA: Diagnosis not present

## 2023-07-28 DIAGNOSIS — I10 Essential (primary) hypertension: Secondary | ICD-10-CM | POA: Diagnosis not present

## 2023-07-28 DIAGNOSIS — K7689 Other specified diseases of liver: Secondary | ICD-10-CM | POA: Diagnosis not present

## 2023-07-28 MED ORDER — LOSARTAN POTASSIUM 50 MG PO TABS: 50.0000 mg | ORAL_TABLET | Freq: Every day | ORAL | 0 refills | Status: DC

## 2023-07-28 MED ORDER — ROSUVASTATIN CALCIUM 10 MG PO TABS: 10.0000 mg | ORAL_TABLET | Freq: Every day | ORAL | 0 refills | Status: DC

## 2023-07-28 MED ORDER — DOCUSATE SODIUM 100 MG PO CAPS
100.0000 mg | ORAL_CAPSULE | Freq: Two times a day (BID) | ORAL | 0 refills | Status: AC
Start: 2023-07-28 — End: ?

## 2023-07-28 NOTE — Telephone Encounter (Signed)
Okay switch. I sent it in to pharmacy. Please have patient monitor BP at home for 2 weeks and schedule Triage RN appt for BP check in 2 weeks. Patient to stop lisinopril. Skip dose of losartan if systolic BP is less than (top number)

## 2023-07-28 NOTE — Addendum Note (Signed)
Addended by: Neale Burly on: 07/28/2023 01:30 PM   Modules accepted: Orders

## 2023-07-28 NOTE — Progress Notes (Addendum)
Acute Office Visit  Subjective:  Patient ID: Rhonda Payne, female    DOB: 1956/01/15, 67 y.o.   MRN: [id number]  Chief Complaint  Patient presents with   Medical Management of Chronic Issues   Hypertension   Transition of Care - Hospitalization  Patient is in today for follow up post hospitalization. History is collected from patient and from chart review. Presented to [facility] on 07/21/23 with abdominal pain. She was then transferred to [facility] and treated with non surgical management of SBO. She was provided an NGT with decompression. She states that they were not very helpful with explaining what happened and what she needs to do next.  Reports that overall she is doing well since coming home.  States that she is continuing bowel rest. She is increasing liquids. Avoiding heavy foods.  She plans to purchase an abdominal binder to wear. She is not lifting anything over 20 lbs.  She has history of multiple abdominal surgeries, including hernia repair that had to be revised. Denies N/V, fever   Hypertension  Reports that during hospitalization she did not have antihypertensives  Reports that she has not been checking her BP at home.  Does have access to a monitor  Denies CP, DOE, palpitations  Patient would like to switch to losartan.   ROS As per HPI  Objective:  BP (!) 146/82   Pulse 78   Ht 5\' 5"  (1.651 m)   Wt 244 lb (110.7 kg)   SpO2 97%   BMI 40.60 kg/m   Physical Exam Constitutional:      General: She is awake. She is not in acute distress.    Appearance: Normal appearance. She is well-developed and well-groomed. She is morbidly obese. She is not ill-appearing, toxic-appearing or diaphoretic.  Cardiovascular:     Rate and Rhythm: Normal rate and regular rhythm.     Pulses: Normal pulses.          Radial pulses are 2+ on the right side and 2+ on the left side.       Posterior tibial pulses are 2+ on the right side and 2+ on the left side.     Heart sounds:  Normal heart sounds. No murmur heard.    No gallop.  Pulmonary:     Effort: Pulmonary effort is normal. No respiratory distress.     Breath sounds: Normal breath sounds. No stridor. No wheezing, rhonchi or rales.  Abdominal:     Palpations: Abdomen is soft.     Tenderness: There is no abdominal tenderness.     Hernia: A hernia is present. Hernia is present in the ventral area.       Comments: Hernia present below umbilicus, soft, reducible, surgical scar over site  Musculoskeletal:     Cervical back: Full passive range of motion without pain and neck supple.     Right lower leg: No edema.     Left lower leg: No edema.  Skin:    General: Skin is warm.     Capillary Refill: Capillary refill takes less than 2 seconds.  Neurological:     General: No focal deficit present.     Mental Status: She is alert, oriented to person, place, and time and easily aroused. Mental status is at baseline.     GCS: GCS eye subscore is 4. GCS verbal subscore is 5. GCS motor subscore is 6.     Motor: No weakness.  Psychiatric:        Attention  and Perception: Attention and perception normal.        Mood and Affect: Mood and affect normal.        Speech: Speech normal.        Behavior: Behavior normal. Behavior is cooperative.        Thought Content: Thought content normal. Thought content does not include homicidal or suicidal ideation. Thought content does not include homicidal or suicidal plan.        Cognition and Memory: Cognition and memory normal.        Judgment: Judgment normal.    The 10-year ASCVD risk score (Arnett DK, et al., 2019) is: 11.9%   Values used to calculate the score:     Age: 33 years     Sex: Female     Is Non-Hispanic African American: Yes     Diabetic: No     Tobacco smoker: No     Systolic Blood Pressure: 146 mmHg     Is BP treated: Yes     HDL Cholesterol: 50 mg/dL     Total Cholesterol: 186 mg/dL     1/61/0960    4:54 AM 05/04/2023   10:20 AM 02/28/2023   10:54  AM  Depression screen PHQ 2/9  Decreased Interest 0 0 0  Down, Depressed, Hopeless 0 0 0  PHQ - 2 Score 0 0 0  Altered sleeping 0  0  Tired, decreased energy 0  0  Change in appetite 0  0  Feeling bad or failure about yourself  0  0  Trouble concentrating 0  0  Moving slowly or fidgety/restless 0  0  Suicidal thoughts 0  0  PHQ-9 Score 0  0  Difficult doing work/chores Not difficult at all  Not difficult at all      07/28/2023    9:42 AM 02/28/2023   10:54 AM 02/03/2023   10:23 AM 09/25/2019    1:49 PM  GAD 7 : Generalized Anxiety Score  Nervous, Anxious, on Edge 0 0 0 0  Control/stop worrying 0 0 0 0  Worry too much - different things 0 0 0 0  Trouble relaxing 0 0 0 0  Restless 0 0 0 0  Easily annoyed or irritable 0 0 0 0  Afraid - awful might happen 0 0 0 0  Total GAD 7 Score 0 0 0 0  Anxiety Difficulty Not difficult at all Not difficult at all Not difficult at all Not difficult at all   Assessment & Plan:  1. History of small bowel obstruction Referral placed to general surgery as below. Instructed patient to avoid constipation, not to lift over 20 lbs, avoid movements and exercises that increase abdominal pressure. Encouraged patient to slowly advance diet.  - docusate sodium (COLACE) 100 MG capsule; Take 1 capsule (100 mg total) by mouth 2 (two) times daily.  Dispense: 10 capsule; Refill: 0  2. Ventral hernia with obstruction and without gangrene Discussed at length safety precautions to maintain with hernia. Referral placed as below. Reviewed notes from [facility] in care everywhere. Per note from Netta Neat, RN 07/24/23 "Unfortunately, she has loss of abdominal wall domain which does not make her an ideal operative candidate in the immediate period, so we will start with conservative management."  - Ambulatory referral to General Surgery  3. Primary hypertension Not at goal in office today. Will switch to losartan as below, per patient preference. Patient to  monitor BP at home and bring log to follow  up appt. Log provided to patient in office. Labs as below. Will communicate results to patient once available. Will await results to determine next steps.  - CMP14+EGFR - Magnesium - CBC with Differential/Platelet - losartan (COZAAR) 50 MG tablet; Take 1 tablet (50 mg total) by mouth daily.  Dispense: 60 tablet; Refill: 0  4. Dyslipidemia Reviewed ASCVD risk score as above. Will start statin as below.  Labs as below. Will communicate results to patient once available. Will await results to determine next steps.  Fasting  - rosuvastatin (CRESTOR) 10 MG tablet; Take 1 tablet (10 mg total) by mouth daily.  Dispense: 60 tablet; Refill: 0 - Lipid panel  5. Hepatic cyst Referral placed as above to general surgery for follow up of hepatic cysts. Reviewed CT results from 07/21/2023. Per note "Multiple scattered hepatic cysts, measuring up to 4.8 cm in the central right liver".   6. Renal cyst Reviewed notes from CT on 07/21/23. Per note, "Bilateral simple renal cysts, measuring up to 3.4 cm in the right upper kidney (series 2/image 32), benign (Bosniak I). No follow-up is recommended."  7. Aortic atherosclerosis (HCC) Reviewed note from CT on 07/21/23, per note "Atherosclerotic calcifications of the abdominal aorta and branch vessels." ASCVD score reviewed above. Started statin therapy as above. Discussed at length with patient start of statin.   8. Pancreatic cyst Reviewed report from CT 07/21/23, "14 mm unilocular cyst along the posterior aspect of the pancreatic tail, likely reflecting a pseudocyst or benign side branch IPMN. Follow-up MRI abdomen with/without contrast is suggested in 2 years." Will plan for follow up MRI in 2 years. Referral placed as above to General Surgery for additional evaluation and recommendations.   The above assessment and management plan was discussed with the patient. The patient verbalized understanding of and has agreed to the  management plan using shared-decision making. Patient is aware to call the clinic if they develop any new symptoms or if symptoms fail to improve or worsen. Patient is aware when to return to the clinic for a follow-up visit. Patient educated on when it is appropriate to go to the emergency department.   Return in about 3 months (around 10/28/2023).  Neale Burly, DNP-FNP [facility] [address] [place], [place] [address] [phone]

## 2023-07-29 LAB — LIPID PANEL
Chol/HDL Ratio: 3.7 ratio (ref 0.0–4.4)
Cholesterol, Total: 159 mg/dL (ref 100–199)
HDL: 43 mg/dL (ref >39)
LDL Chol Calc (NIH): 99 mg/dL (ref 0–99)
Triglycerides: 90 mg/dL (ref 0–149)
VLDL Cholesterol Cal: 17 mg/dL (ref 5–40)

## 2023-07-29 LAB — CBC WITH DIFFERENTIAL/PLATELET
Basophils Absolute: 0 10*3/uL (ref 0.0–0.2)
Basos: 1 %
EOS (ABSOLUTE): 0.1 10*3/uL (ref 0.0–0.4)
Eos: 2 %
Hematocrit: 43 % (ref 34.0–46.6)
Hemoglobin: 13.7 g/dL (ref 11.1–15.9)
Immature Grans (Abs): 0 10*3/uL (ref 0.0–0.1)
Immature Granulocytes: 0 %
Lymphocytes Absolute: 1.9 10*3/uL (ref 0.7–3.1)
Lymphs: 38 %
MCH: 25.5 pg — ABNORMAL LOW (ref 26.6–33.0)
MCHC: 31.9 g/dL (ref 31.5–35.7)
MCV: 80 fL (ref 79–97)
Monocytes Absolute: 0.4 10*3/uL (ref 0.1–0.9)
Monocytes: 8 %
Neutrophils Absolute: 2.6 10*3/uL (ref 1.4–7.0)
Neutrophils: 51 %
Platelets: 236 10*3/uL (ref 150–450)
RBC: 5.37 x10E6/uL — ABNORMAL HIGH (ref 3.77–5.28)
RDW: 14.3 % (ref 11.7–15.4)
WBC: 4.9 10*3/uL (ref 3.4–10.8)

## 2023-07-29 LAB — CMP14+EGFR
ALT: 17 IU/L (ref 0–32)
AST: 24 IU/L (ref 0–40)
Albumin: 4.1 g/dL (ref 3.9–4.9)
Alkaline Phosphatase: 55 IU/L (ref 44–121)
BUN/Creatinine Ratio: 9 — ABNORMAL LOW (ref 12–28)
BUN: 10 mg/dL (ref 8–27)
Bilirubin Total: 1.2 mg/dL (ref 0.0–1.2)
CO2: 27 mmol/L (ref 20–29)
Calcium: 10 mg/dL (ref 8.7–10.3)
Chloride: 101 mmol/L (ref 96–106)
Creatinine, Ser: 1.14 mg/dL — ABNORMAL HIGH (ref 0.57–1.00)
Globulin, Total: 3.3 g/dL (ref 1.5–4.5)
Glucose: 91 mg/dL (ref 70–99)
Potassium: 4.3 mmol/L (ref 3.5–5.2)
Sodium: 141 mmol/L (ref 134–144)
Total Protein: 7.4 g/dL (ref 6.0–8.5)
eGFR: 53 mL/min/{1.73_m2} — ABNORMAL LOW (ref >59)

## 2023-07-29 LAB — MAGNESIUM: Magnesium: 1.7 mg/dL (ref 1.6–2.3)

## 2023-07-31 ENCOUNTER — Encounter: Payer: Self-pay | Admitting: Family Medicine

## 2023-08-01 ENCOUNTER — Encounter: Payer: Self-pay | Admitting: Family Medicine

## 2023-08-01 NOTE — Progress Notes (Signed)
Patient has decreased kidney function. In reviewing prior labs, patient has had decreased function in the past. If patient is agreeable, would like to refer her to kidney specialist for further evaluation.

## 2023-08-15 ENCOUNTER — Other Ambulatory Visit: Payer: Medicare Other

## 2023-08-15 ENCOUNTER — Ambulatory Visit: Payer: Medicare Other

## 2023-08-16 ENCOUNTER — Ambulatory Visit: Payer: Medicare Other

## 2023-08-16 DIAGNOSIS — Z0130 Encounter for examination of blood pressure without abnormal findings: Secondary | ICD-10-CM

## 2023-08-16 NOTE — Progress Notes (Signed)
Reviewed BP log provided by patient. From 07/29/2023 to 08/16/23 patient averaging 110s/70s with max BP 141/87. Will not make adjustments to regimen at this time. Patient to continue monitoring BP and bring measurements to follow up appts.

## 2023-08-16 NOTE — Progress Notes (Signed)
Patient came in for a BP check   131/82 P-80  Log was placed on providers desk to review

## 2023-09-04 NOTE — Patient Instructions (Signed)

## 2023-09-13 ENCOUNTER — Encounter: Payer: Self-pay | Admitting: Family Medicine

## 2023-09-13 ENCOUNTER — Ambulatory Visit: Payer: Medicare Other | Admitting: Family Medicine

## 2023-09-13 ENCOUNTER — Other Ambulatory Visit: Payer: Medicare Other

## 2023-09-13 VITALS — BP 126/81 | HR 66 | Temp 98.0°F | Ht 65.0 in | Wt 239.0 lb

## 2023-09-13 DIAGNOSIS — R899 Unspecified abnormal finding in specimens from other organs, systems and tissues: Secondary | ICD-10-CM | POA: Diagnosis not present

## 2023-09-13 DIAGNOSIS — Z0001 Encounter for general adult medical examination with abnormal findings: Secondary | ICD-10-CM

## 2023-09-13 DIAGNOSIS — I10 Essential (primary) hypertension: Secondary | ICD-10-CM

## 2023-09-13 DIAGNOSIS — E785 Hyperlipidemia, unspecified: Secondary | ICD-10-CM | POA: Diagnosis not present

## 2023-09-13 DIAGNOSIS — Z0000 Encounter for general adult medical examination without abnormal findings: Secondary | ICD-10-CM

## 2023-09-13 DIAGNOSIS — R944 Abnormal results of kidney function studies: Secondary | ICD-10-CM

## 2023-09-13 MED ORDER — ROSUVASTATIN CALCIUM 10 MG PO TABS
10.0000 mg | ORAL_TABLET | Freq: Every day | ORAL | 3 refills | Status: DC
Start: 2023-09-13 — End: 2023-10-05

## 2023-09-13 MED ORDER — LOSARTAN POTASSIUM 50 MG PO TABS
50.0000 mg | ORAL_TABLET | Freq: Every day | ORAL | 3 refills | Status: DC
Start: 2023-09-13 — End: 2023-10-05

## 2023-09-13 NOTE — Progress Notes (Signed)
Subjective:    Rhonda Payne is a 67 y.o. female who presents for a Welcome to Medicare exam.   Cardiac Risk Factors include: advanced age (>52men, >21 women);dyslipidemia;family history of premature cardiovascular disease;hypertension;obesity (BMI >30kg/m2)  Patient denies any use of alcohol, tobacco or illicit drugs  Reports compliance with current medications and supplements Patient reports that she completes all of her ADLs independently. Reports that she cooks, cleans, dresses, bathes, grocery shops, and drives independently.  Reports that she is making some changes to her diet due to small bowel obstruction in August. She is eating greens and [place] meat or chicken breast. States that she is trying to decrease sugar intake. Reports that she walks 3-4 times per week for 30 minutes.  She does not have advanced directives in place. However states that she is interested in completing and has talked with her daughter about it. Reports that she does not see any other specialists.  She has had one hospitalization in the previous 12 months and one ED visit in the previous 12 months. She was hospitalized for a small bowel obstruction in August (07/21/23). She [place] ED [place]. Denies any falls. Denies SI or symptoms of depression.  She declines colonoscopy, shingles, pneumonia vaccines. Not a candidate for pap smear. Declines TDAP at this time.  She has Dexa scheduled, mammogram ordered.      Objective:    Today's Vitals   09/13/23 0928 09/13/23 0932 09/13/23 1015  BP: (!) 144/68 (!) 144/78 126/81  Pulse: 66    Temp: 98 F (36.7 C)    SpO2: 95%    Weight: 239 lb (108.4 kg)    Height: 5\' 5"  (1.651 m)    Body mass index is 39.77 kg/m.  Medications Outpatient Encounter Medications as of 09/13/2023  Medication Sig   Cholecalciferol (VITAMIN D3 PO) Take 1 tablet by mouth daily.    docusate sodium (COLACE) 100 MG capsule Take 1 capsule (100 mg  total) by mouth 2 (two) times daily.   hydrochlorothiazide (HYDRODIURIL) 25 MG tablet Take 1 tablet (25 mg total) by mouth daily.   levocetirizine (XYZAL ALLERGY 24HR) 5 MG tablet Take 1 tablet (5 mg total) by mouth every evening.   losartan (COZAAR) 50 MG tablet Take 1 tablet (50 mg total) by mouth daily.   rosuvastatin (CRESTOR) 10 MG tablet Take 1 tablet (10 mg total) by mouth daily.   No facility-administered encounter medications on file as of 09/13/2023.     History: Past Medical History:  Diagnosis Date   Anemia    History of blood transfusion    "when I was a child; related to sickle cell trait"   Hypertension    Sickle cell trait (HCC)    Past Surgical History:  Procedure Laterality Date   CHOLECYSTECTOMY N/A 03/20/2015   Procedure: LAPAROSCOPIC CHOLECYSTECTOMY WITH INTRAOPERATIVE CHOLANGIOGRAM;  Surgeon: Ovidio Kin, MD;  Location: [facility] OR;  Service: General;  Laterality: N/A;   DILATION AND CURETTAGE OF UTERUS  1990's   INCISIONAL HERNIA REPAIR N/A 07/31/2019   Procedure: OPEN INCISIONAL HERNIA REPAIR WITH MESH;  Surgeon: Harriette Bouillon, MD;  Location: [facility] OR;  Service: General;  Laterality: N/A;   LAPAROSCOPIC CHOLECYSTECTOMY  03/20/2015   w/IOC   LAPAROSCOPY N/A 07/23/2019   Procedure: laparotomy with hernia repair with mesh;  Surgeon: Axel Filler, MD;  Location: [facility] OR;  Service: General;  Laterality: N/A;   VAGINAL HYSTERECTOMY  1990's    Family  History  Problem Relation Age of Onset   Diabetes Mother    Cancer Father        no sure but thinks it's prostate   Social History   Occupational History   Not on file  Tobacco Use   Smoking status: Never   Smokeless tobacco: Never  Vaping Use   Vaping status: Never Used  Substance and Sexual Activity   Alcohol use: No   Drug use: No   Sexual activity: Never    Tobacco Counseling Counseling given: No Never smoker   Immunizations and Health Maintenance  There is no immunization history on file for this  patient. Health Maintenance Due  Topic Date Due   DTaP/Tdap/Td (1 - Tdap) Never done   Cervical Cancer Screening (HPV/Pap Cotest)  Never done   MAMMOGRAM  Never done   DEXA SCAN  Never done   COVID-19 Vaccine (1 - 2023-24 season) Never done    Activities of Daily Living    09/13/2023    9:35 AM  In your present state of health, do you have any difficulty performing the following activities:  Hearing? 0  Vision? 1  Comment left eye  Difficulty concentrating or making decisions? 0  Walking or climbing stairs? 0  Dressing or bathing? 0  Doing errands, shopping? 0  Preparing Food and eating ? N  Using the Toilet? N  In the past six months, have you accidently leaked urine? N  Do you have problems with loss of bowel control? N  Managing your Medications? N  Managing your Finances? N  Housekeeping or managing your Housekeeping? N    Physical Exam   Physical Exam (optional), or other factors deemed appropriate based on the beneficiary's medical and social history and current clinical standards.  Advanced Directives: Does Patient Have a Medical Advance Directive?: No  EKG: 07/24/23 unchanged from previous tracings, normal sinus rhythm, frequent PVC's noted     Assessment:    This is a routine wellness examination for this patient.   Vision/Hearing screen Vision Screening   Right eye Left eye Both eyes  Without correction 20/30  20/25  With correction     Comments: Patient states she cannot see out of left eye    Goals   None    Depression Screen    07/28/2023    9:42 AM 05/04/2023   10:20 AM 02/28/2023   10:54 AM 02/03/2023   10:23 AM  PHQ 2/9 Scores  PHQ - 2 Score 0 0 0 0  PHQ- 9 Score 0  0 0     Fall Risk    09/13/2023    9:32 AM  Fall Risk   Falls in the past year? 0  Number falls in past yr: 0  Injury with Fall? 0  Risk for fall due to : No Fall Risks  Follow up Falls evaluation completed    Cognitive Function:       09/13/2023    9:46 AM  6CIT  Screen  What Year? 0 points  What month? 0 points  What time? 0 points  Count back from 20 0 points  Months in reverse 0 points  Repeat phrase 0 points  Total Score 0 points    Patient Care Team: Arrie Senate, FNP as PCP - General (Family Medicine)     Plan:   Gift was seen today for medicare wellness.  Diagnoses and all orders for this visit:  Encounter for Medicare annual wellness exam I have personally  reviewed and noted the following in the patient's chart:   Medical and social history Use of alcohol, tobacco or illicit drugs  Current medications and supplements Functional ability and status Nutritional status Physical activity Advanced directives List of other physicians Hospitalizations, surgeries, and ER visits in previous 12 months Vitals Screenings to include cognitive, depression, and falls Referrals and appointments   Primary hypertension Repeat BP at goal. Refill provided.  -     losartan (COZAAR) 50 MG tablet; Take 1 tablet (50 mg total) by mouth daily.  Dyslipidemia Refill provided  -     rosuvastatin (CRESTOR) 10 MG tablet; Take 1 tablet (10 mg total) by mouth daily.  Decreased GFR Labs as below. Will communicate results to patient once available. Will await results to determine next steps.  -     Microalbumin / creatinine urine ratio -     CMP14+EGFR  Abnormal laboratory test result Labs as below. Will communicate results to patient once available. Will await results to determine next steps.  -     CBC with Differential/Platelet  In addition, I have reviewed and discussed with patient certain preventive protocols, quality metrics, and best practice recommendations. A written personalized care plan for preventive services as well as general preventive health recommendations were provided to patient.     Neale Burly, DNP-FNP [facility] [address] [place], [place] [address] [phone]

## 2023-09-14 LAB — CBC WITH DIFFERENTIAL/PLATELET
Basophils Absolute: 0 10*3/uL (ref 0.0–0.2)
Basos: 1 %
EOS (ABSOLUTE): 0.1 10*3/uL (ref 0.0–0.4)
Eos: 3 %
Hematocrit: 42.7 % (ref 34.0–46.6)
Hemoglobin: 13.3 g/dL (ref 11.1–15.9)
Immature Grans (Abs): 0 10*3/uL (ref 0.0–0.1)
Immature Granulocytes: 0 %
Lymphocytes Absolute: 1.8 10*3/uL (ref 0.7–3.1)
Lymphs: 40 %
MCH: 25.8 pg — ABNORMAL LOW (ref 26.6–33.0)
MCHC: 31.1 g/dL — ABNORMAL LOW (ref 31.5–35.7)
MCV: 83 fL (ref 79–97)
Monocytes Absolute: 0.3 10*3/uL (ref 0.1–0.9)
Monocytes: 6 %
Neutrophils Absolute: 2.3 10*3/uL (ref 1.4–7.0)
Neutrophils: 50 %
Platelets: 215 10*3/uL (ref 150–450)
RBC: 5.16 x10E6/uL (ref 3.77–5.28)
RDW: 14.3 % (ref 11.7–15.4)
WBC: 4.5 10*3/uL (ref 3.4–10.8)

## 2023-09-14 LAB — CMP14+EGFR
ALT: 15 IU/L (ref 0–32)
AST: 24 IU/L (ref 0–40)
Albumin: 4.2 g/dL (ref 3.9–4.9)
Alkaline Phosphatase: 62 IU/L (ref 44–121)
BUN/Creatinine Ratio: 11 — ABNORMAL LOW (ref 12–28)
BUN: 12 mg/dL (ref 8–27)
Bilirubin Total: 1.2 mg/dL (ref 0.0–1.2)
CO2: 25 mmol/L (ref 20–29)
Calcium: 9.8 mg/dL (ref 8.7–10.3)
Chloride: 104 mmol/L (ref 96–106)
Creatinine, Ser: 1.07 mg/dL — ABNORMAL HIGH (ref 0.57–1.00)
Globulin, Total: 3.4 g/dL (ref 1.5–4.5)
Glucose: 96 mg/dL (ref 70–99)
Potassium: 4.2 mmol/L (ref 3.5–5.2)
Sodium: 144 mmol/L (ref 134–144)
Total Protein: 7.6 g/dL (ref 6.0–8.5)
eGFR: 58 mL/min/{1.73_m2} — ABNORMAL LOW (ref >59)

## 2023-09-14 LAB — MICROALBUMIN / CREATININE URINE RATIO
Creatinine, Urine: 48.3 mg/dL
Microalb/Creat Ratio: 27 mg/g{creat} (ref 0–29)
Microalbumin, Urine: 12.9 ug/mL

## 2023-09-14 NOTE — Progress Notes (Signed)
Patient GFR remains low. Recommend patient avoid medications such as NSAIDs. Increase hydration to at least 80-100 oz of water daily. Will repeat at follow up appt. Microalbumin looks good. Slight variation in MCH and MCHC, some variation in labs is expected and okay.

## 2023-10-05 ENCOUNTER — Encounter: Payer: Self-pay | Admitting: Family Medicine

## 2023-10-05 ENCOUNTER — Other Ambulatory Visit: Payer: Self-pay | Admitting: *Deleted

## 2023-10-05 DIAGNOSIS — I10 Essential (primary) hypertension: Secondary | ICD-10-CM

## 2023-10-05 DIAGNOSIS — E785 Hyperlipidemia, unspecified: Secondary | ICD-10-CM

## 2023-10-05 MED ORDER — LOSARTAN POTASSIUM 50 MG PO TABS: 50.0000 mg | ORAL_TABLET | Freq: Every day | ORAL | 0 refills | Status: DC

## 2023-10-05 MED ORDER — ROSUVASTATIN CALCIUM 10 MG PO TABS
10.0000 mg | ORAL_TABLET | Freq: Every day | ORAL | 0 refills | Status: DC
Start: 2023-10-05 — End: 2023-12-11

## 2023-10-13 ENCOUNTER — Encounter: Payer: Self-pay | Admitting: *Deleted

## 2023-10-16 ENCOUNTER — Encounter: Payer: Self-pay | Admitting: Family Medicine

## 2023-12-09 ENCOUNTER — Encounter: Payer: Self-pay | Admitting: Family Medicine

## 2023-12-11 ENCOUNTER — Other Ambulatory Visit: Payer: Self-pay | Admitting: Family Medicine

## 2023-12-11 DIAGNOSIS — I10 Essential (primary) hypertension: Secondary | ICD-10-CM

## 2023-12-11 DIAGNOSIS — E785 Hyperlipidemia, unspecified: Secondary | ICD-10-CM

## 2023-12-15 ENCOUNTER — Other Ambulatory Visit: Payer: Medicare Other

## 2023-12-15 ENCOUNTER — Ambulatory Visit: Payer: Medicare Other | Admitting: Family Medicine

## 2023-12-21 ENCOUNTER — Ambulatory Visit (INDEPENDENT_AMBULATORY_CARE_PROVIDER_SITE_OTHER): Payer: Medicare Other | Admitting: Family Medicine

## 2023-12-21 ENCOUNTER — Ambulatory Visit: Payer: Medicare Other | Admitting: Nurse Practitioner

## 2023-12-21 ENCOUNTER — Encounter: Payer: Self-pay | Admitting: Family Medicine

## 2023-12-21 ENCOUNTER — Ambulatory Visit (INDEPENDENT_AMBULATORY_CARE_PROVIDER_SITE_OTHER): Payer: Medicare Other

## 2023-12-21 ENCOUNTER — Other Ambulatory Visit: Payer: Self-pay | Admitting: Family Medicine

## 2023-12-21 VITALS — BP 126/83 | HR 86 | Temp 98.0°F | Ht 65.0 in | Wt 248.0 lb

## 2023-12-21 DIAGNOSIS — K436 Other and unspecified ventral hernia with obstruction, without gangrene: Secondary | ICD-10-CM | POA: Diagnosis not present

## 2023-12-21 DIAGNOSIS — N281 Cyst of kidney, acquired: Secondary | ICD-10-CM

## 2023-12-21 DIAGNOSIS — K862 Cyst of pancreas: Secondary | ICD-10-CM | POA: Insufficient documentation

## 2023-12-21 DIAGNOSIS — E785 Hyperlipidemia, unspecified: Secondary | ICD-10-CM

## 2023-12-21 DIAGNOSIS — Z780 Asymptomatic menopausal state: Secondary | ICD-10-CM

## 2023-12-21 DIAGNOSIS — R944 Abnormal results of kidney function studies: Secondary | ICD-10-CM

## 2023-12-21 DIAGNOSIS — K7689 Other specified diseases of liver: Secondary | ICD-10-CM | POA: Diagnosis not present

## 2023-12-21 DIAGNOSIS — I10 Essential (primary) hypertension: Secondary | ICD-10-CM

## 2023-12-21 NOTE — Progress Notes (Signed)
 Subjective:  Patient ID: Rhonda Payne, female    DOB: [birth year], 68 y.o.   MRN: [id number]  Patient Care Team: Cathlene Marry Lenis, FNP as PCP - General (Family Medicine)   Chief Complaint:  Medical Management of Chronic Issues   HPI: Rhonda Payne is a 68 y.o. female presenting on 12/21/2023 for Medical Management of Chronic Issues   HPI 1. Primary hypertension Has BP monitor at home Yes BP at home average - has not been checking at home.  ROS Denies fatigue, peripheral edema, changes to vision, chest pain, headaches, palpitations, sweats, SOB, PND, orthopnea Meds losartan  and hydrochlorothiazide   CAD risks hypertension, hypercholesterolemia/hyperlipidemia  States anxiety is at baseline and she manages without treatment. Denies SI. Declines desire for counseling.   HLD  Reports compliance with crestor . States that she does have a rash sometimes but is unsure if this is due to lisinopril  or losartan . States that she is having a rash that is itchy and hive like. States that it is mainly on her face and cheek. States that ice, cool compress, and cerave face wash and moisturizer. States that it started when she started taking losartan . Denies trouble breathing and throat closing. Does not have rash at this time.   Hernia  States that she did not follow up with surgery. She is no longer interested in completing surgery.   Relevant past medical, surgical, family, and social history reviewed and updated as indicated.  Allergies and medications reviewed and updated. Data reviewed: Chart in Epic.   Past Medical History:  Diagnosis Date   Anemia    History of blood transfusion    when I was a child; related to sickle cell trait   Hypertension    Sickle cell trait (HCC)     Past Surgical History:  Procedure Laterality Date   CHOLECYSTECTOMY N/A 03/20/2015   Procedure: LAPAROSCOPIC CHOLECYSTECTOMY WITH INTRAOPERATIVE CHOLANGIOGRAM;  Surgeon: Alm Angle, MD;  Location:  [facility] OR;  Service: General;  Laterality: N/A;   DILATION AND CURETTAGE OF UTERUS  1990's   INCISIONAL HERNIA REPAIR N/A 07/31/2019   Procedure: OPEN INCISIONAL HERNIA REPAIR WITH MESH;  Surgeon: Vanderbilt Ned, MD;  Location: [facility] OR;  Service: General;  Laterality: N/A;   LAPAROSCOPIC CHOLECYSTECTOMY  03/20/2015   w/IOC   LAPAROSCOPY N/A 07/23/2019   Procedure: laparotomy with hernia repair with mesh;  Surgeon: Rubin Calamity, MD;  Location: [facility] OR;  Service: General;  Laterality: N/A;   VAGINAL HYSTERECTOMY  1990's    Social History   Socioeconomic History   Marital status: Widowed    Spouse name: Not on file   Number of children: Not on file   Years of education: Not on file   Highest education level: Not on file  Occupational History   Not on file  Tobacco Use   Smoking status: Never   Smokeless tobacco: Never  Vaping Use   Vaping status: Never Used  Substance and Sexual Activity   Alcohol use: No   Drug use: No   Sexual activity: Never  Other Topics Concern   Not on file  Social History Narrative   Not on file   Social Drivers of Health   Financial Resource Strain: Low Risk  (12/20/2023)   Overall Financial Resource Strain (CARDIA)    Difficulty of Paying Living Expenses: Not hard at all  Food Insecurity: No Food Insecurity (12/20/2023)   Hunger Vital Sign    Worried About Running Out of Food in the Last Year: Never  true    Ran Out of Food in the Last Year: Never true  Transportation Needs: No Transportation Needs (12/20/2023)   PRAPARE - Administrator, Civil Service (Medical): No    Lack of Transportation (Non-Medical): No  Physical Activity: Insufficiently Active (09/13/2023)   Exercise Vital Sign    Days of Exercise per Week: 4 days    Minutes of Exercise per Session: 30 min  Stress: No Stress Concern Present (12/20/2023)   [employer] of Occupational Health - Occupational Stress Questionnaire    Feeling of Stress : Not at all  Social Connections:  Unknown (12/20/2023)   Social Connection and Isolation Panel [NHANES]    Frequency of Communication with Friends and Family: More than three times a week    Frequency of Social Gatherings with Friends and Family: Three times a week    Attends Religious Services: Patient declined    Active Member of Clubs or Organizations: Patient declined    Attends Banker Meetings: Not on file    Marital Status: Widowed  Intimate Partner Violence: Not At Risk (09/13/2023)   Humiliation, Afraid, Rape, and Kick questionnaire    Fear of Current or Ex-Partner: No    Emotionally Abused: No    Physically Abused: No    Sexually Abused: No    Outpatient Encounter Medications as of 12/21/2023  Medication Sig   Cholecalciferol (VITAMIN D3 PO) Take 1 tablet by mouth daily.    docusate sodium  (COLACE) 100 MG capsule Take 1 capsule (100 mg total) by mouth 2 (two) times daily.   hydrochlorothiazide  (HYDRODIURIL ) 25 MG tablet Take 1 tablet (25 mg total) by mouth daily.   levocetirizine (XYZAL  ALLERGY 24HR) 5 MG tablet Take 1 tablet (5 mg total) by mouth every evening.   losartan  (COZAAR ) 50 MG tablet TAKE 1 TABLET BY MOUTH DAILY   rosuvastatin  (CRESTOR ) 10 MG tablet TAKE 1 TABLET BY MOUTH DAILY   No facility-administered encounter medications on file as of 12/21/2023.    Allergies  Allergen Reactions   Hydrocodone -Acetaminophen  Hives   Aspirin Rash and Other (See Comments)    Caused pain and rash    Review of Systems As Per HPI  Objective:  BP 126/83   Pulse 86   Temp 98 F (36.7 C)   Ht 5' 5 (1.651 m)   Wt 248 lb (112.5 kg)   SpO2 97%   BMI 41.27 kg/m    Wt Readings from Last 3 Encounters:  12/21/23 248 lb (112.5 kg)  09/13/23 239 lb (108.4 kg)  07/28/23 244 lb (110.7 kg)   Physical Exam Constitutional:      General: She is awake. She is not in acute distress.    Appearance: Normal appearance. She is well-developed and well-groomed. She is morbidly obese. She is not ill-appearing,  toxic-appearing or diaphoretic.  Cardiovascular:     Rate and Rhythm: Normal rate and regular rhythm.     Pulses: Normal pulses.          Radial pulses are 2+ on the right side and 2+ on the left side.       Posterior tibial pulses are 2+ on the right side and 2+ on the left side.     Heart sounds: Normal heart sounds. No murmur heard.    No gallop.  Pulmonary:     Effort: Pulmonary effort is normal. No respiratory distress.     Breath sounds: Normal breath sounds. No stridor. No wheezing, rhonchi or rales.  Musculoskeletal:  Cervical back: Full passive range of motion without pain and neck supple.     Right lower leg: No edema.     Left lower leg: No edema.  Skin:    General: Skin is warm.     Capillary Refill: Capillary refill takes less than 2 seconds.  Neurological:     General: No focal deficit present.     Mental Status: She is alert, oriented to person, place, and time and easily aroused. Mental status is at baseline.     GCS: GCS eye subscore is 4. GCS verbal subscore is 5. GCS motor subscore is 6.     Motor: No weakness.  Psychiatric:        Attention and Perception: Attention and perception normal.        Mood and Affect: Mood and affect normal.        Speech: Speech normal.        Behavior: Behavior normal. Behavior is cooperative.        Thought Content: Thought content normal. Thought content does not include homicidal or suicidal ideation. Thought content does not include homicidal or suicidal plan.        Cognition and Memory: Cognition and memory normal.        Judgment: Judgment normal.     Results for orders placed or performed in visit on 09/13/23  CMP14+EGFR   Collection Time: 09/13/23 10:27 AM  Result Value Ref Range   Glucose 96 70 - 99 mg/dL   BUN 12 8 - 27 mg/dL   Creatinine, Ser 8.92 (H) 0.57 - 1.00 mg/dL   eGFR 58 (L) >40 fO/fpw/8.26   BUN/Creatinine Ratio 11 (L) 12 - 28   Sodium 144 134 - 144 mmol/L   Potassium 4.2 3.5 - 5.2 mmol/L   Chloride  104 96 - 106 mmol/L   CO2 25 20 - 29 mmol/L   Calcium  9.8 8.7 - 10.3 mg/dL   Total Protein 7.6 6.0 - 8.5 g/dL   Albumin 4.2 3.9 - 4.9 g/dL   Globulin, Total 3.4 1.5 - 4.5 g/dL   Bilirubin Total 1.2 0.0 - 1.2 mg/dL   Alkaline Phosphatase 62 44 - 121 IU/L   AST 24 0 - 40 IU/L   ALT 15 0 - 32 IU/L  CBC with Differential/Platelet   Collection Time: 09/13/23 10:27 AM  Result Value Ref Range   WBC 4.5 3.4 - 10.8 x10E3/uL   RBC 5.16 3.77 - 5.28 x10E6/uL   Hemoglobin 13.3 11.1 - 15.9 g/dL   Hematocrit 57.2 65.9 - 46.6 %   MCV 83 79 - 97 fL   MCH 25.8 (L) 26.6 - 33.0 pg   MCHC 31.1 (L) 31.5 - 35.7 g/dL   RDW 85.6 88.2 - 84.5 %   Platelets 215 150 - 450 x10E3/uL   Neutrophils 50 Not Estab. %   Lymphs 40 Not Estab. %   Monocytes 6 Not Estab. %   Eos 3 Not Estab. %   Basos 1 Not Estab. %   Neutrophils Absolute 2.3 1.4 - 7.0 x10E3/uL   Lymphocytes Absolute 1.8 0.7 - 3.1 x10E3/uL   Monocytes Absolute 0.3 0.1 - 0.9 x10E3/uL   EOS (ABSOLUTE) 0.1 0.0 - 0.4 x10E3/uL   Basophils Absolute 0.0 0.0 - 0.2 x10E3/uL   Immature Granulocytes 0 Not Estab. %   Immature Grans (Abs) 0.0 0.0 - 0.1 x10E3/uL  Microalbumin / creatinine urine ratio   Collection Time: 09/13/23 10:29 AM  Result Value Ref Range   Creatinine,  Urine 48.3 Not Estab. mg/dL   Microalbumin, Urine 87.0 Not Estab. ug/mL   Microalb/Creat Ratio 27 0 - 29 mg/g creat       12/21/2023   10:01 AM 09/13/2023    9:31 AM 07/28/2023    9:42 AM 05/04/2023   10:20 AM 02/28/2023   10:54 AM  Depression screen PHQ 2/9  Decreased Interest 0 0 0 0 0  Down, Depressed, Hopeless 0 0 0 0 0  PHQ - 2 Score 0 0 0 0 0  Altered sleeping  0 0  0  Tired, decreased energy  0 0  0  Change in appetite  0 0  0  Feeling bad or failure about yourself   0 0  0  Trouble concentrating  0 0  0  Moving slowly or fidgety/restless  0 0  0  Suicidal thoughts  0 0  0  PHQ-9 Score  0 0  0  Difficult doing work/chores  Not difficult at all Not difficult at all  Not  difficult at all       12/21/2023   10:02 AM 09/13/2023    9:31 AM 07/28/2023    9:42 AM 02/28/2023   10:54 AM  GAD 7 : Generalized Anxiety Score  Nervous, Anxious, on Edge 0 0 0 0  Control/stop worrying 0 0 0 0  Worry too much - different things 0 0 0 0  Trouble relaxing 0 0 0 0  Restless 0 0 0 0  Easily annoyed or irritable 0 0 0 0  Afraid - awful might happen 0 0 0 0  Total GAD 7 Score 0 0 0 0  Anxiety Difficulty  Not difficult at all Not difficult at all Not difficult at all   Pertinent labs & imaging results that were available during my care of the patient were reviewed by me and considered in my medical decision making.  Assessment & Plan:  Yarely was seen today for medical management of chronic issues.  Diagnoses and all orders for this visit: 1. Primary hypertension (Primary) Well controlled at repeat. Instructed patient to stop hydrochlorothiazide  due to decreased GFR and monitor BP closely at home. Labs as below. Will communicate results to patient once available. Will await results to determine next steps.  - CMP14+EGFR - CBC with Differential/Platelet  2. Hepatic cyst Referral placed previously to general surgery for follow up of hepatic cysts. Reviewed CT results from 07/21/2023. Per note Multiple scattered hepatic cysts, measuring up to 4.8 cm in the central right liver. Patient declined follow up at this time.   3. Renal cyst Reviewed notes from CT on 07/21/23. Per note, Bilateral simple renal cysts, measuring up to 3.4 cm in the right upper kidney (series 2/image 32), benign (Bosniak I). No follow-up is recommended.  Patient agreeable to nephrology referral as below.  - Ambulatory referral to Nephrology  4. 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 Patient does not wish to follow up at this time. Reviewed red flag symptoms.   5. Decreased GFR Labs as below. Will communicate results to patient once available.  Will await results to determine next steps.  Referral placed as below.  - CMP14+EGFR - CBC with Differential/Platelet - Ambulatory referral to Nephrology  6. Dyslipidemia Labs as below. Will communicate results to patient once available. Will await results to determine next steps.  Not fasting  - Lipid panel  7. Pancreatic cyst Reviewed report from CT 07/21/23, 14 mm unilocular cyst along  the posterior aspect of the pancreatic tail, likely reflecting a pseudocyst or benign side branch IPMN. Follow-up MRI abdomen with/without contrast is suggested in 2 years. Will plan for follow up MRI 2 years ~07/2025. Referral placed as above to General Surgery for additional evaluation and recommendations.   Continue all other maintenance medications.  Follow up plan: Return in about 3 months (around 03/20/2024) for Chronic Condition Follow up.   Continue healthy lifestyle choices, including diet (rich in fruits, vegetables, and lean proteins, and low in salt and simple carbohydrates) and exercise (at least 30 minutes of moderate physical activity daily).  Written and verbal instructions provided   The above assessment and management plan was discussed with the patient. The patient verbalized understanding of and has agreed to the management plan. Patient is aware to call the clinic if they develop any new symptoms or if symptoms persist or worsen. Patient is aware when to return to the clinic for a follow-up visit. Patient educated on when it is appropriate to go to the emergency department.   Marry Kins, DNP-FNP [facility] [address] [place], [place] [address] [phone]

## 2023-12-22 LAB — LIPID PANEL
Chol/HDL Ratio: 2.2 {ratio} (ref 0.0–4.4)
Cholesterol, Total: 117 mg/dL (ref 100–199)
HDL: 54 mg/dL (ref >39)
LDL Chol Calc (NIH): 48 mg/dL (ref 0–99)
Triglycerides: 70 mg/dL (ref 0–149)
VLDL Cholesterol Cal: 15 mg/dL (ref 5–40)

## 2023-12-22 LAB — CBC WITH DIFFERENTIAL/PLATELET
Basophils Absolute: 0 10*3/uL (ref 0.0–0.2)
Basos: 1 %
EOS (ABSOLUTE): 0.1 10*3/uL (ref 0.0–0.4)
Eos: 2 %
Hematocrit: 40.3 % (ref 34.0–46.6)
Hemoglobin: 12.9 g/dL (ref 11.1–15.9)
Immature Grans (Abs): 0 10*3/uL (ref 0.0–0.1)
Immature Granulocytes: 0 %
Lymphocytes Absolute: 1.2 10*3/uL (ref 0.7–3.1)
Lymphs: 29 %
MCH: 26.3 pg — ABNORMAL LOW (ref 26.6–33.0)
MCHC: 32 g/dL (ref 31.5–35.7)
MCV: 82 fL (ref 79–97)
Monocytes Absolute: 0.3 10*3/uL (ref 0.1–0.9)
Monocytes: 6 %
Neutrophils Absolute: 2.6 10*3/uL (ref 1.4–7.0)
Neutrophils: 62 %
Platelets: 183 10*3/uL (ref 150–450)
RBC: 4.9 x10E6/uL (ref 3.77–5.28)
RDW: 13.6 % (ref 11.7–15.4)
WBC: 4.2 10*3/uL (ref 3.4–10.8)

## 2023-12-22 LAB — CMP14+EGFR
ALT: 11 [IU]/L (ref 0–32)
AST: 23 [IU]/L (ref 0–40)
Albumin: 4 g/dL (ref 3.9–4.9)
Alkaline Phosphatase: 64 [IU]/L (ref 44–121)
BUN/Creatinine Ratio: 10 — ABNORMAL LOW (ref 12–28)
BUN: 11 mg/dL (ref 8–27)
Bilirubin Total: 1 mg/dL (ref 0.0–1.2)
CO2: 24 mmol/L (ref 20–29)
Calcium: 9.3 mg/dL (ref 8.7–10.3)
Chloride: 105 mmol/L (ref 96–106)
Creatinine, Ser: 1.14 mg/dL — ABNORMAL HIGH (ref 0.57–1.00)
Globulin, Total: 3.1 g/dL (ref 1.5–4.5)
Glucose: 94 mg/dL (ref 70–99)
Potassium: 4.4 mmol/L (ref 3.5–5.2)
Sodium: 146 mmol/L — ABNORMAL HIGH (ref 134–144)
Total Protein: 7.1 g/dL (ref 6.0–8.5)
eGFR: 53 mL/min/{1.73_m2} — ABNORMAL LOW (ref >59)

## 2023-12-22 NOTE — Progress Notes (Signed)
 Kidney function remains declined but stable. I recommend she follow up with nephrology. Stop taking hydrochlorothiazide . Please keep a log of BP at home for the next 2 weeks and call office with numbers, send them through MyChart, or drop them off. If BP is increasing, will change regimen.

## 2023-12-22 NOTE — Progress Notes (Signed)
 DEXA results are normal. Continue to optimize vitamin D and calcium and weight bearing exercises. Recheck in 2 years.

## 2023-12-25 ENCOUNTER — Encounter: Payer: Self-pay | Admitting: Family Medicine

## 2024-01-08 ENCOUNTER — Encounter: Payer: Self-pay | Admitting: Family Medicine

## 2024-01-09 MED ORDER — AMLODIPINE BESYLATE 10 MG PO TABS: 10.0000 mg | ORAL_TABLET | Freq: Every day | ORAL | 0 refills | Status: DC

## 2024-01-18 ENCOUNTER — Ambulatory Visit: Payer: 59 | Admitting: Family Medicine

## 2024-01-24 DIAGNOSIS — K7689 Other specified diseases of liver: Secondary | ICD-10-CM | POA: Diagnosis not present

## 2024-01-24 DIAGNOSIS — N1831 Chronic kidney disease, stage 3a: Secondary | ICD-10-CM | POA: Diagnosis not present

## 2024-01-24 DIAGNOSIS — I129 Hypertensive chronic kidney disease with stage 1 through stage 4 chronic kidney disease, or unspecified chronic kidney disease: Secondary | ICD-10-CM | POA: Diagnosis not present

## 2024-01-24 DIAGNOSIS — N281 Cyst of kidney, acquired: Secondary | ICD-10-CM | POA: Diagnosis not present

## 2024-01-25 ENCOUNTER — Other Ambulatory Visit: Payer: Self-pay | Admitting: *Deleted

## 2024-01-25 DIAGNOSIS — E785 Hyperlipidemia, unspecified: Secondary | ICD-10-CM

## 2024-01-25 DIAGNOSIS — I10 Essential (primary) hypertension: Secondary | ICD-10-CM

## 2024-01-25 MED ORDER — ROSUVASTATIN CALCIUM 10 MG PO TABS
10.0000 mg | ORAL_TABLET | Freq: Every day | ORAL | 0 refills | Status: DC
Start: 2024-01-25 — End: 2024-03-13

## 2024-01-27 ENCOUNTER — Other Ambulatory Visit: Payer: Self-pay | Admitting: Family Medicine

## 2024-01-27 DIAGNOSIS — I10 Essential (primary) hypertension: Secondary | ICD-10-CM

## 2024-02-01 NOTE — Patient Instructions (Signed)
 Our records indicate that you are due for your annual mammogram/breast imaging. While there is no way to prevent breast cancer, early detection provides the best opportunity for curing it. For women over the age of 39, the American Cancer Society recommends a yearly clinical breast exam and a yearly mammogram. These practices have saved thousands of lives. We need your help to ensure that you are receiving optimal medical care. Please call the imaging location that has done you previous mammograms. Please remember to list [place] as your primary care. This helps make sure we receive a report and can update your chart.  Below is the contact information for several local breast imaging centers. You may call the location that works best for you, and they will be happy to assistance in making you an appointment. You do not need an order for a regular screening mammogram. However, if you are having any problems or concerns with you breast area, please let your primary care provider know, and appropriate orders will be placed. Please let our office know if you have any questions or concerns. Or if you need information for another imaging center not on this list or outside of the area. We are commented to working with you on your health care journey.   The mobile unit/bus ([facility]) - they come twice a month to our location.  These appointments can be made through our office or by call The Breast [facility]  [facility]  [address] [place], [place] [address] Phone [phone]  [facility]  [address] [place], [place] [address] [phone]  [facility] ([facility] of [facility])  [phone] [address], [place] [address] [phone]  [facility]  [address] [place] [place] [address] [phone]  [facility] - [facility]  [address] [place] [address] [phone]  [facility]ammography in [place]  [address] [place], [place] [address] [phone]  [facility] [address] [place], [place] [address] [phone]  [facility] [address] [place], [place] [address] [phone]  [facility] [place], [place] [address] [phone]

## 2024-02-06 ENCOUNTER — Encounter: Payer: Self-pay | Admitting: Family Medicine

## 2024-02-06 ENCOUNTER — Other Ambulatory Visit: Payer: Self-pay | Admitting: Family Medicine

## 2024-02-06 ENCOUNTER — Ambulatory Visit (INDEPENDENT_AMBULATORY_CARE_PROVIDER_SITE_OTHER): Payer: 59 | Admitting: Family Medicine

## 2024-02-06 VITALS — BP 175/78 | HR 71 | Temp 98.0°F | Ht 65.0 in | Wt 253.0 lb

## 2024-02-06 DIAGNOSIS — I10 Essential (primary) hypertension: Secondary | ICD-10-CM

## 2024-02-06 NOTE — Progress Notes (Deleted)
 Subjective:  Patient ID: Rhonda Payne, female    DOB: 1956-05-14, 68 y.o.   MRN: [id number]  Patient Care Team: Ellamae Sia Aleen Campi, FNP as PCP - General (Family Medicine)   Chief Complaint:  BP follow up   HPI: Rhonda Payne is a 68 y.o. female presenting on 02/06/2024 for BP follow up  Patient is here for follow up of her BP.  She was previously taking losartan, however she felt that it was causing hives. Transitioned to amlodipine. Patient was instructed to stop taking hydrochlorothiazide given her decline in renal function. She was worried as she had elevated BP and resumed hydrochlorothiazide.    Relevant past medical, surgical, family, and social history reviewed and updated as indicated.  Allergies and medications reviewed and updated. Data reviewed: Chart in Epic.   Past Medical History:  Diagnosis Date   Anemia    History of blood transfusion    "when I was a child; related to sickle cell trait"   Hypertension    Sickle cell trait (HCC)     Past Surgical History:  Procedure Laterality Date   CHOLECYSTECTOMY N/A 03/20/2015   Procedure: LAPAROSCOPIC CHOLECYSTECTOMY WITH INTRAOPERATIVE CHOLANGIOGRAM;  Surgeon: Ovidio Kin, MD;  Location: [facility] OR;  Service: General;  Laterality: N/A;   DILATION AND CURETTAGE OF UTERUS  1990's   INCISIONAL HERNIA REPAIR N/A 07/31/2019   Procedure: OPEN INCISIONAL HERNIA REPAIR WITH MESH;  Surgeon: Harriette Bouillon, MD;  Location: [facility] OR;  Service: General;  Laterality: N/A;   LAPAROSCOPIC CHOLECYSTECTOMY  03/20/2015   w/IOC   LAPAROSCOPY N/A 07/23/2019   Procedure: laparotomy with hernia repair with mesh;  Surgeon: Axel Filler, MD;  Location: [facility] OR;  Service: General;  Laterality: N/A;   VAGINAL HYSTERECTOMY  1990's    Social History   Socioeconomic History   Marital status: Widowed    Spouse name: Not on file   Number of children: Not on file   Years of education: Not on file   Highest education level: Not on file   Occupational History   Not on file  Tobacco Use   Smoking status: Never   Smokeless tobacco: Never  Vaping Use   Vaping status: Never Used  Substance and Sexual Activity   Alcohol use: No   Drug use: No   Sexual activity: Never  Other Topics Concern   Not on file  Social History Narrative   Not on file   Social Drivers of Health   Financial Resource Strain: Low Risk  (12/20/2023)   Overall Financial Resource Strain (CARDIA)    Difficulty of Paying Living Expenses: Not hard at all  Food Insecurity: No Food Insecurity (12/20/2023)   Hunger Vital Sign    Worried About Running Out of Food in the Last Year: Never true    Ran Out of Food in the Last Year: Never true  Transportation Needs: No Transportation Needs (12/20/2023)   PRAPARE - Administrator, Civil Service (Medical): No    Lack of Transportation (Non-Medical): No  Physical Activity: Insufficiently Active (09/13/2023)   Exercise Vital Sign    Days of Exercise per Week: 4 days    Minutes of Exercise per Session: 30 min  Stress: No Stress Concern Present (12/20/2023)   [employer] of Occupational Health - Occupational Stress Questionnaire    Feeling of Stress : Not at all  Social Connections: Unknown (12/20/2023)   Social Connection and Isolation Panel [NHANES]    Frequency of Communication with Friends  and Family: More than three times a week    Frequency of Social Gatherings with Friends and Family: Three times a week    Attends Religious Services: Patient declined    Active Member of Clubs or Organizations: Patient declined    Attends Banker Meetings: Not on file    Marital Status: Widowed  Intimate Partner Violence: Not At Risk (09/13/2023)   Humiliation, Afraid, Rape, and Kick questionnaire    Fear of Current or Ex-Partner: No    Emotionally Abused: No    Physically Abused: No    Sexually Abused: No    Outpatient Encounter Medications as of 02/06/2024  Medication Sig   amLODipine  (NORVASC) 10 MG tablet Take 1 tablet (10 mg total) by mouth daily.   Cholecalciferol (VITAMIN D3 PO) Take 1 tablet by mouth daily.    docusate sodium (COLACE) 100 MG capsule Take 1 capsule (100 mg total) by mouth 2 (two) times daily.   hydrochlorothiazide (HYDRODIURIL) 25 MG tablet Take 1 tablet (25 mg total) by mouth daily.   levocetirizine (XYZAL ALLERGY 24HR) 5 MG tablet Take 1 tablet (5 mg total) by mouth every evening.   rosuvastatin (CRESTOR) 10 MG tablet Take 1 tablet (10 mg total) by mouth daily.   No facility-administered encounter medications on file as of 02/06/2024.    Allergies  Allergen Reactions   Hydrocodone-Acetaminophen Hives   Losartan Hives   Aspirin Rash and Other (See Comments)    Caused pain and rash    Review of Systems As per HPI  Objective:  There were no vitals taken for this visit.   Wt Readings from Last 3 Encounters:  12/21/23 248 lb (112.5 kg)  09/13/23 239 lb (108.4 kg)  07/28/23 244 lb (110.7 kg)    Physical Exam  Results for orders placed or performed in visit on 12/21/23  CMP14+EGFR   Collection Time: 12/21/23 10:37 AM  Result Value Ref Range   Glucose 94 70 - 99 mg/dL   BUN 11 8 - 27 mg/dL   Creatinine, Ser 1.61 (H) 0.57 - 1.00 mg/dL   eGFR 53 (L) >09 UE/AVW/0.98   BUN/Creatinine Ratio 10 (L) 12 - 28   Sodium 146 (H) 134 - 144 mmol/L   Potassium 4.4 3.5 - 5.2 mmol/L   Chloride 105 96 - 106 mmol/L   CO2 24 20 - 29 mmol/L   Calcium 9.3 8.7 - 10.3 mg/dL   Total Protein 7.1 6.0 - 8.5 g/dL   Albumin 4.0 3.9 - 4.9 g/dL   Globulin, Total 3.1 1.5 - 4.5 g/dL   Bilirubin Total 1.0 0.0 - 1.2 mg/dL   Alkaline Phosphatase 64 44 - 121 IU/L   AST 23 0 - 40 IU/L   ALT 11 0 - 32 IU/L  CBC with Differential/Platelet   Collection Time: 12/21/23 10:37 AM  Result Value Ref Range   WBC 4.2 3.4 - 10.8 x10E3/uL   RBC 4.90 3.77 - 5.28 x10E6/uL   Hemoglobin 12.9 11.1 - 15.9 g/dL   Hematocrit 11.9 14.7 - 46.6 %   MCV 82 79 - 97 fL   MCH 26.3 (L)  26.6 - 33.0 pg   MCHC 32.0 31.5 - 35.7 g/dL   RDW 82.9 56.2 - 13.0 %   Platelets 183 150 - 450 x10E3/uL   Neutrophils 62 Not Estab. %   Lymphs 29 Not Estab. %   Monocytes 6 Not Estab. %   Eos 2 Not Estab. %   Basos 1 Not Estab. %  Neutrophils Absolute 2.6 1.4 - 7.0 x10E3/uL   Lymphocytes Absolute 1.2 0.7 - 3.1 x10E3/uL   Monocytes Absolute 0.3 0.1 - 0.9 x10E3/uL   EOS (ABSOLUTE) 0.1 0.0 - 0.4 x10E3/uL   Basophils Absolute 0.0 0.0 - 0.2 x10E3/uL   Immature Granulocytes 0 Not Estab. %   Immature Grans (Abs) 0.0 0.0 - 0.1 x10E3/uL  Lipid panel   Collection Time: 12/21/23 10:37 AM  Result Value Ref Range   Cholesterol, Total 117 100 - 199 mg/dL   Triglycerides 70 0 - 149 mg/dL   HDL 54 >16 mg/dL   VLDL Cholesterol Cal 15 5 - 40 mg/dL   LDL Chol Calc (NIH) 48 0 - 99 mg/dL   Chol/HDL Ratio 2.2 0.0 - 4.4 ratio       12/21/2023   10:01 AM 09/13/2023    9:31 AM 07/28/2023    9:42 AM 05/04/2023   10:20 AM 02/28/2023   10:54 AM  Depression screen PHQ 2/9  Decreased Interest 0 0 0 0 0  Down, Depressed, Hopeless 0 0 0 0 0  PHQ - 2 Score 0 0 0 0 0  Altered sleeping  0 0  0  Tired, decreased energy  0 0  0  Change in appetite  0 0  0  Feeling bad or failure about yourself   0 0  0  Trouble concentrating  0 0  0  Moving slowly or fidgety/restless  0 0  0  Suicidal thoughts  0 0  0  PHQ-9 Score  0 0  0  Difficult doing work/chores  Not difficult at all Not difficult at all  Not difficult at all       12/21/2023   10:02 AM 09/13/2023    9:31 AM 07/28/2023    9:42 AM 02/28/2023   10:54 AM  GAD 7 : Generalized Anxiety Score  Nervous, Anxious, on Edge 0 0 0 0  Control/stop worrying 0 0 0 0  Worry too much - different things 0 0 0 0  Trouble relaxing 0 0 0 0  Restless 0 0 0 0  Easily annoyed or irritable 0 0 0 0  Afraid - awful might happen 0 0 0 0  Total GAD 7 Score 0 0 0 0  Anxiety Difficulty  Not difficult at all Not difficult at all Not difficult at all      Pertinent labs &  imaging results that were available during my care of the patient were reviewed by me and considered in my medical decision making.  Assessment & Plan:  There are no diagnoses linked to this encounter.   Continue all other maintenance medications.  Follow up plan: No follow-ups on file.   Continue healthy lifestyle choices, including diet (rich in fruits, vegetables, and lean proteins, and low in salt and simple carbohydrates) and exercise (at least 30 minutes of moderate physical activity daily).  Written and verbal instructions provided   The above assessment and management plan was discussed with the patient. The patient verbalized understanding of and has agreed to the management plan. Patient is aware to call the clinic if they develop any new symptoms or if symptoms persist or worsen. Patient is aware when to return to the clinic for a follow-up visit. Patient educated on when it is appropriate to go to the emergency department.   Neale Burly, DNP-FNP [facility] [address] [place], [place] [address] [phone]

## 2024-02-07 ENCOUNTER — Encounter: Payer: Self-pay | Admitting: Family Medicine

## 2024-02-07 DIAGNOSIS — I10 Essential (primary) hypertension: Secondary | ICD-10-CM

## 2024-02-07 MED ORDER — HYDROCHLOROTHIAZIDE 25 MG PO TABS: 25.0000 mg | ORAL_TABLET | Freq: Every day | ORAL | 1 refills | Status: DC

## 2024-02-08 NOTE — Progress Notes (Signed)
 Patient left before being seen. N/S

## 2024-02-22 ENCOUNTER — Ambulatory Visit (INDEPENDENT_AMBULATORY_CARE_PROVIDER_SITE_OTHER): Payer: 59 | Admitting: Internal Medicine

## 2024-02-22 ENCOUNTER — Encounter: Payer: Self-pay | Admitting: Internal Medicine

## 2024-02-22 VITALS — BP 149/84 | HR 62 | Temp 98.4°F | Ht 65.0 in | Wt 253.8 lb

## 2024-02-22 DIAGNOSIS — K862 Cyst of pancreas: Secondary | ICD-10-CM | POA: Diagnosis not present

## 2024-02-22 DIAGNOSIS — Z1211 Encounter for screening for malignant neoplasm of colon: Secondary | ICD-10-CM

## 2024-02-22 DIAGNOSIS — K7689 Other specified diseases of liver: Secondary | ICD-10-CM | POA: Diagnosis not present

## 2024-02-22 NOTE — Patient Instructions (Signed)
 For your pancreatic cyst, we will plan on surveillance MRI next year.  For colon cancer screening we have 2 options including colonoscopy or Cologuard stool testing.  If you would like to have either 1 of these done then let us know we can make arrangements.  Follow-up in 1 year or sooner if needed.  It was very nice meeting you today.  Dr. Marletta Lor

## 2024-02-23 NOTE — Progress Notes (Signed)
 Primary Care Physician:  Arrie Senate, FNP Primary Gastroenterologist:  Dr. Marletta Lor  Chief Complaint  Patient presents with   New Patient (Initial Visit)    Referred for hepatic and pancreatic cysts    HPI:   Rhonda Payne is a 68 y.o. female who presents to clinic today by referral from nephrology Johnn Hai NP for evaluation.  Patient presented to [facility] ER 07/21/2023 with acute onset of abdominal pain and nausea and vomiting.  She has a history of hysterectomy, hernia repair x 2 and cholecystectomy.  CT abdomen pelvis with contrast which I personally reviewed showed small bowel obstruction with transition point related to large complex ventral hernia.  She was transferred to [facility] and treated conservatively with NG tube decompression.  She did not follow-up with surgery after her discharge and she has been doing well.  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  Also multiple scattered hepatic cysts measuring up to 4.8 cm.  Patient denies any family history of pancreatic malignancy.  In regards to colon cancer screening, patient has never had a colonoscopy nor has she completed stool testing of any kind.  No family history of colorectal malignancy.  No melena hematochezia.  Denies any issues with constipation or diarrhea.  Overall feels well from a GI standpoint.  Past Medical History:  Diagnosis Date   Anemia    History of blood transfusion    "when I was a child; related to sickle cell trait"   Hypertension    Sickle cell trait (HCC)     Past Surgical History:  Procedure Laterality Date   CHOLECYSTECTOMY N/A 03/20/2015   Procedure: LAPAROSCOPIC CHOLECYSTECTOMY WITH INTRAOPERATIVE CHOLANGIOGRAM;  Surgeon: Ovidio Kin, MD;  Location: [facility] OR;  Service: General;  Laterality: N/A;   DILATION AND CURETTAGE OF UTERUS  1990's   INCISIONAL HERNIA REPAIR N/A  07/31/2019   Procedure: OPEN INCISIONAL HERNIA REPAIR WITH MESH;  Surgeon: Harriette Bouillon, MD;  Location: [facility] OR;  Service: General;  Laterality: N/A;   LAPAROSCOPIC CHOLECYSTECTOMY  03/20/2015   w/IOC   LAPAROSCOPY N/A 07/23/2019   Procedure: laparotomy with hernia repair with mesh;  Surgeon: Axel Filler, MD;  Location: [facility] OR;  Service: General;  Laterality: N/A;   VAGINAL HYSTERECTOMY  1990's    Current Outpatient Medications  Medication Sig Dispense Refill   Cholecalciferol (VITAMIN D3 PO) Take 1 tablet by mouth daily.      hydrochlorothiazide (HYDRODIURIL) 25 MG tablet Take 1 tablet (25 mg total) by mouth daily. 90 tablet 1   rosuvastatin (CRESTOR) 10 MG tablet Take 1 tablet (10 mg total) by mouth daily. 90 tablet 0   amLODipine (NORVASC) 10 MG tablet Take 1 tablet (10 mg total) by mouth daily. (Patient not taking: Reported on 02/22/2024) 90 tablet 0   docusate sodium (COLACE) 100 MG capsule Take 1 capsule (100 mg total) by mouth 2 (two) times daily. (Patient not taking: Reported on 02/22/2024) 10 capsule 0   levocetirizine (XYZAL ALLERGY 24HR) 5 MG tablet Take 1 tablet (5 mg total) by mouth every evening. (Patient not taking: Reported on 02/22/2024) 15 tablet 1   No current facility-administered medications for this visit.    Allergies as of 02/22/2024 - Review Complete 02/22/2024  Allergen Reaction Noted   Hydrocodone-acetaminophen Hives 05/04/2023   Losartan Hives 01/09/2024   Aspirin Rash and Other (See Comments) 01/19/2012    Family History  Problem Relation Age of Onset   Diabetes Mother    Cancer Father        no sure but thinks it's prostate    Social History   Socioeconomic History   Marital status: Widowed    Spouse name: Not on file   Number of children: Not on file   Years of education: Not on file   Highest education level: Not on file  Occupational History   Not on file  Tobacco Use   Smoking status: Never   Smokeless tobacco: Never  Vaping Use    Vaping status: Never Used  Substance and Sexual Activity   Alcohol use: No   Drug use: No   Sexual activity: Never  Other Topics Concern   Not on file  Social History Narrative   Not on file   Social Drivers of Health   Financial Resource Strain: Low Risk  (12/20/2023)   Overall Financial Resource Strain (CARDIA)    Difficulty of Paying Living Expenses: Not hard at all  Food Insecurity: No Food Insecurity (12/20/2023)   Hunger Vital Sign    Worried About Running Out of Food in the Last Year: Never true    Ran Out of Food in the Last Year: Never true  Transportation Needs: No Transportation Needs (12/20/2023)   PRAPARE - Administrator, Civil Service (Medical): No    Lack of Transportation (Non-Medical): No  Physical Activity: Insufficiently Active (09/13/2023)   Exercise Vital Sign    Days of Exercise per Week: 4 days    Minutes of Exercise per Session: 30 min  Stress: No Stress Concern Present (12/20/2023)   [employer] of Occupational Health - Occupational Stress Questionnaire    Feeling of Stress : Not at all  Social Connections: Unknown (12/20/2023)   Social Connection and Isolation Panel [NHANES]    Frequency of Communication with Friends and Family: More than three times a week    Frequency of Social Gatherings with Friends and Family: Three times a week    Attends Religious Services: Patient declined    Active Member of Clubs or Organizations: Patient declined    Attends Banker Meetings: Not on file    Marital Status: Widowed  Intimate Partner Violence: Not At Risk (09/13/2023)   Humiliation, Afraid, Rape, and Kick questionnaire    Fear of Current or Ex-Partner: No    Emotionally Abused: No    Physically Abused: No    Sexually Abused: No    Subjective: Review of Systems  Constitutional:  Negative for chills and fever.  HENT:  Negative for congestion and hearing loss.   Eyes:  Negative for blurred vision and double vision.  Respiratory:   Negative for cough and shortness of breath.   Cardiovascular:  Negative for chest pain and palpitations.  Gastrointestinal:  Negative for abdominal pain, blood in stool, constipation, diarrhea, heartburn, melena and vomiting.  Genitourinary:  Negative for dysuria and urgency.  Musculoskeletal:  Negative for joint pain and myalgias.  Skin:  Negative for itching and rash.  Neurological:  Negative for dizziness and headaches.  Psychiatric/Behavioral:  Negative for depression. The patient is not nervous/anxious.        Objective: BP (!) 149/84   Pulse 62   Temp 98.4 F (36.9 C)   Ht 5\' 5"  (1.651 m)   Wt 253 lb 12.8 oz (115.1 kg)   BMI 42.23 kg/m  Physical Exam Constitutional:      Appearance: Normal appearance.  HENT:  Head: Normocephalic and atraumatic.  Eyes:     Extraocular Movements: Extraocular movements intact.     Conjunctiva/sclera: Conjunctivae normal.  Cardiovascular:     Rate and Rhythm: Normal rate and regular rhythm.  Pulmonary:     Effort: Pulmonary effort is normal.     Breath sounds: Normal breath sounds.  Abdominal:     General: Bowel sounds are normal.     Palpations: Abdomen is soft.  Musculoskeletal:        General: No swelling. Normal range of motion.     Cervical back: Normal range of motion and neck supple.  Skin:    General: Skin is warm and dry.     Coloration: Skin is not jaundiced.  Neurological:     General: No focal deficit present.     Mental Status: She is alert and oriented to person, place, and time.  Psychiatric:        Mood and Affect: Mood normal.        Behavior: Behavior normal.      Assessment/Plan:  1.  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  Would recommend MRI pancreas in 2 years for surveillance purposes (August 2026).  2.  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  3.  Colon cancer screening-discussed in  depth with patient today.  Discussed colonoscopy as well as Cologuard testing.  She would like to think about both options.  If she would like to proceed with either 1 and then all she has to do is call the office and we can arrange.  Thank you Johnn Hai for the kind referral.   02/23/2024 11:04 AM   Disclaimer: This note was dictated with voice recognition software. Similar sounding words can inadvertently be transcribed and may not be corrected upon review.

## 2024-02-28 ENCOUNTER — Ambulatory Visit (INDEPENDENT_AMBULATORY_CARE_PROVIDER_SITE_OTHER): Admitting: Family Medicine

## 2024-02-28 ENCOUNTER — Encounter: Payer: Self-pay | Admitting: Family Medicine

## 2024-02-28 VITALS — BP 146/76 | HR 67 | Temp 98.2°F | Ht 65.0 in | Wt 255.0 lb

## 2024-02-28 DIAGNOSIS — N189 Chronic kidney disease, unspecified: Secondary | ICD-10-CM

## 2024-02-28 DIAGNOSIS — I10 Essential (primary) hypertension: Secondary | ICD-10-CM

## 2024-02-28 DIAGNOSIS — E785 Hyperlipidemia, unspecified: Secondary | ICD-10-CM | POA: Diagnosis not present

## 2024-02-28 NOTE — Progress Notes (Signed)
 Subjective:  Patient ID: Rhonda Payne, female    DOB: May 16, 1956, 68 y.o.   MRN: [id number]  Patient Care Team: Arrie Senate, FNP as PCP - General (Family Medicine)   Chief Complaint:  Medical Management of Chronic Issues (Blood presssure/Discuss medications/Pt did not like how amlodipine made her feel./She stopped amlodipine and restarted her losartan)   HPI: Rhonda Payne is a 68 y.o. female presenting on 02/28/2024 for Medical Management of Chronic Issues (Blood presssure/Discuss medications/Pt did not like how amlodipine made her feel./She stopped amlodipine and restarted her losartan)  1. Primary hypertension (Primary) Has BP monitor at home Yes BP at home average Not checking ROS Denies fatigue, peripheral edema, changes to vision, chest pain, headaches, palpitations, sweats, SOB, PND, orthopnea Endorses some anxiety states that it comes and goes.  Stopped amlodipine 3 days ago as she was having fatigue. States that it was giving her a headache and made her sleep all day.  Restarted Losartan and hydrochlorothiazide States that she is not having a rash. She is taking half in the morning and half in the afternoons.  CAD risks hypertension, hypercholesterolemia/hyperlipidemia  2. Chronic renal impairment, unspecified CKD stage She is following with Dr. Wolfgang Phoenix. She has follow up with him in June.   3. Dyslipidemia Lipid/Cholesterol, Follow-up  Last lipid panel Other pertinent labs  Lab Results  Component Value Date   CHOL 117 12/21/2023   HDL 54 12/21/2023   LDLCALC 48 12/21/2023   TRIG 70 12/21/2023   CHOLHDL 2.2 12/21/2023   Lab Results  Component Value Date   ALT 11 12/21/2023   AST 23 12/21/2023   PLT 183 12/21/2023   TSH 2.180 04/06/2023     Management includes crestor.  She reports excellent compliance with treatment. She is not having side effects.   Symptoms: No chest pain No chest pressure/discomfort  No dyspnea No lower extremity  edema  No numbness or tingling of extremity No orthopnea  No palpitations No paroxysmal nocturnal dyspnea  No speech difficulty No syncope   Current diet:  trying to cut out sugars  Current exercise:  waling on treadmill.   The ASCVD Risk score (Arnett DK, et al., 2019) failed to calculate for the following reasons:   The valid total cholesterol range is 130 to 320 mg/dL   Relevant past medical, surgical, family, and social history reviewed and updated as indicated.  Allergies and medications reviewed and updated. Data reviewed: Chart in Epic.   Past Medical History:  Diagnosis Date   Anemia    History of blood transfusion    "when I was a child; related to sickle cell trait"   Hypertension    Sickle cell trait (HCC)     Past Surgical History:  Procedure Laterality Date   CHOLECYSTECTOMY N/A 03/20/2015   Procedure: LAPAROSCOPIC CHOLECYSTECTOMY WITH INTRAOPERATIVE CHOLANGIOGRAM;  Surgeon: Ovidio Kin, MD;  Location: [facility] OR;  Service: General;  Laterality: N/A;   DILATION AND CURETTAGE OF UTERUS  1990's   INCISIONAL HERNIA REPAIR N/A 07/31/2019   Procedure: OPEN INCISIONAL HERNIA REPAIR WITH MESH;  Surgeon: Harriette Bouillon, MD;  Location: [facility] OR;  Service: General;  Laterality: N/A;   LAPAROSCOPIC CHOLECYSTECTOMY  03/20/2015   w/IOC   LAPAROSCOPY N/A 07/23/2019   Procedure: laparotomy with hernia repair with mesh;  Surgeon: Axel Filler, MD;  Location: [facility] OR;  Service: General;  Laterality: N/A;   VAGINAL HYSTERECTOMY  1990's    Social History   Socioeconomic History   Marital status:  Widowed    Spouse name: Not on file   Number of children: Not on file   Years of education: Not on file   Highest education level: Not on file  Occupational History   Not on file  Tobacco Use   Smoking status: Never   Smokeless tobacco: Never  Vaping Use   Vaping status: Never Used  Substance and Sexual Activity   Alcohol use: No   Drug use: No   Sexual activity: Never  Other Topics  Concern   Not on file  Social History Narrative   Not on file   Social Drivers of Health   Financial Resource Strain: Low Risk  (12/20/2023)   Overall Financial Resource Strain (CARDIA)    Difficulty of Paying Living Expenses: Not hard at all  Food Insecurity: No Food Insecurity (12/20/2023)   Hunger Vital Sign    Worried About Running Out of Food in the Last Year: Never true    Ran Out of Food in the Last Year: Never true  Transportation Needs: No Transportation Needs (12/20/2023)   PRAPARE - Administrator, Civil Service (Medical): No    Lack of Transportation (Non-Medical): No  Physical Activity: Insufficiently Active (09/13/2023)   Exercise Vital Sign    Days of Exercise per Week: 4 days    Minutes of Exercise per Session: 30 min  Stress: No Stress Concern Present (12/20/2023)   [employer] of Occupational Health - Occupational Stress Questionnaire    Feeling of Stress : Not at all  Social Connections: Unknown (12/20/2023)   Social Connection and Isolation Panel [NHANES]    Frequency of Communication with Friends and Family: More than three times a week    Frequency of Social Gatherings with Friends and Family: Three times a week    Attends Religious Services: Patient declined    Active Member of Clubs or Organizations: Patient declined    Attends Banker Meetings: Not on file    Marital Status: Widowed  Intimate Partner Violence: Not At Risk (09/13/2023)   Humiliation, Afraid, Rape, and Kick questionnaire    Fear of Current or Ex-Partner: No    Emotionally Abused: No    Physically Abused: No    Sexually Abused: No    Outpatient Encounter Medications as of 02/28/2024  Medication Sig   Cholecalciferol (VITAMIN D3 PO) Take 1 tablet by mouth daily.    hydrochlorothiazide (HYDRODIURIL) 25 MG tablet Take 1 tablet (25 mg total) by mouth daily.   rosuvastatin (CRESTOR) 10 MG tablet Take 1 tablet (10 mg total) by mouth daily.   amLODipine (NORVASC) 10 MG  tablet Take 1 tablet (10 mg total) by mouth daily. (Patient not taking: Reported on 02/28/2024)   docusate sodium (COLACE) 100 MG capsule Take 1 capsule (100 mg total) by mouth 2 (two) times daily. (Patient not taking: Reported on 02/28/2024)   levocetirizine (XYZAL ALLERGY 24HR) 5 MG tablet Take 1 tablet (5 mg total) by mouth every evening. (Patient not taking: Reported on 02/28/2024)   No facility-administered encounter medications on file as of 02/28/2024.    Allergies  Allergen Reactions   Hydrocodone-Acetaminophen Hives   Losartan Hives   Aspirin Rash and Other (See Comments)    Caused pain and rash    Review of Systems As per HPI  Objective:  BP (!) 146/76   Pulse 67   Temp 98.2 F (36.8 C)   Ht 5\' 5"  (1.651 m)   Wt 255 lb (115.7 kg)  SpO2 97%   BMI 42.43 kg/m    Wt Readings from Last 3 Encounters:  02/28/24 255 lb (115.7 kg)  02/22/24 253 lb 12.8 oz (115.1 kg)  02/06/24 253 lb (114.8 kg)    Physical Exam Constitutional:      General: She is awake. She is not in acute distress.    Appearance: Normal appearance. She is well-developed and well-groomed. She is obese. She is not ill-appearing, toxic-appearing or diaphoretic.  Cardiovascular:     Rate and Rhythm: Normal rate and regular rhythm.     Pulses: Normal pulses.          Radial pulses are 2+ on the right side and 2+ on the left side.       Posterior tibial pulses are 2+ on the right side and 2+ on the left side.     Heart sounds: Normal heart sounds. No murmur heard.    No gallop.  Pulmonary:     Effort: Pulmonary effort is normal. No respiratory distress.     Breath sounds: Normal breath sounds. No stridor. No wheezing, rhonchi or rales.  Musculoskeletal:     Cervical back: Full passive range of motion without pain and neck supple.     Right lower leg: No edema.     Left lower leg: No edema.  Skin:    General: Skin is warm.     Capillary Refill: Capillary refill takes less than 2 seconds.  Neurological:      General: No focal deficit present.     Mental Status: She is alert, oriented to person, place, and time and easily aroused. Mental status is at baseline.     GCS: GCS eye subscore is 4. GCS verbal subscore is 5. GCS motor subscore is 6.     Motor: No weakness.  Psychiatric:        Attention and Perception: Attention and perception normal.        Mood and Affect: Mood and affect normal.        Speech: Speech normal.        Behavior: Behavior normal. Behavior is cooperative.        Thought Content: Thought content normal. Thought content does not include homicidal or suicidal ideation. Thought content does not include homicidal or suicidal plan.        Cognition and Memory: Cognition and memory normal.        Judgment: Judgment normal.      Results for orders placed or performed in visit on 12/21/23  CMP14+EGFR   Collection Time: 12/21/23 10:37 AM  Result Value Ref Range   Glucose 94 70 - 99 mg/dL   BUN 11 8 - 27 mg/dL   Creatinine, Ser 8.65 (H) 0.57 - 1.00 mg/dL   eGFR 53 (L) >78 IO/NGE/9.52   BUN/Creatinine Ratio 10 (L) 12 - 28   Sodium 146 (H) 134 - 144 mmol/L   Potassium 4.4 3.5 - 5.2 mmol/L   Chloride 105 96 - 106 mmol/L   CO2 24 20 - 29 mmol/L   Calcium 9.3 8.7 - 10.3 mg/dL   Total Protein 7.1 6.0 - 8.5 g/dL   Albumin 4.0 3.9 - 4.9 g/dL   Globulin, Total 3.1 1.5 - 4.5 g/dL   Bilirubin Total 1.0 0.0 - 1.2 mg/dL   Alkaline Phosphatase 64 44 - 121 IU/L   AST 23 0 - 40 IU/L   ALT 11 0 - 32 IU/L  CBC with Differential/Platelet   Collection Time: 12/21/23 10:37 AM  Result Value Ref Range   WBC 4.2 3.4 - 10.8 x10E3/uL   RBC 4.90 3.77 - 5.28 x10E6/uL   Hemoglobin 12.9 11.1 - 15.9 g/dL   Hematocrit 96.0 45.4 - 46.6 %   MCV 82 79 - 97 fL   MCH 26.3 (L) 26.6 - 33.0 pg   MCHC 32.0 31.5 - 35.7 g/dL   RDW 09.8 11.9 - 14.7 %   Platelets 183 150 - 450 x10E3/uL   Neutrophils 62 Not Estab. %   Lymphs 29 Not Estab. %   Monocytes 6 Not Estab. %   Eos 2 Not Estab. %   Basos 1  Not Estab. %   Neutrophils Absolute 2.6 1.4 - 7.0 x10E3/uL   Lymphocytes Absolute 1.2 0.7 - 3.1 x10E3/uL   Monocytes Absolute 0.3 0.1 - 0.9 x10E3/uL   EOS (ABSOLUTE) 0.1 0.0 - 0.4 x10E3/uL   Basophils Absolute 0.0 0.0 - 0.2 x10E3/uL   Immature Granulocytes 0 Not Estab. %   Immature Grans (Abs) 0.0 0.0 - 0.1 x10E3/uL  Lipid panel   Collection Time: 12/21/23 10:37 AM  Result Value Ref Range   Cholesterol, Total 117 100 - 199 mg/dL   Triglycerides 70 0 - 149 mg/dL   HDL 54 >82 mg/dL   VLDL Cholesterol Cal 15 5 - 40 mg/dL   LDL Chol Calc (NIH) 48 0 - 99 mg/dL   Chol/HDL Ratio 2.2 0.0 - 4.4 ratio       02/28/2024    9:47 AM 02/06/2024   11:01 AM 12/21/2023   10:01 AM 09/13/2023    9:31 AM 07/28/2023    9:42 AM  Depression screen PHQ 2/9  Decreased Interest 0 0 0 0 0  Down, Depressed, Hopeless 0 0 0 0 0  PHQ - 2 Score 0 0 0 0 0  Altered sleeping  0  0 0  Tired, decreased energy  0  0 0  Change in appetite  0  0 0  Feeling bad or failure about yourself   0  0 0  Trouble concentrating  0  0 0  Moving slowly or fidgety/restless  0  0 0  Suicidal thoughts  0  0 0  PHQ-9 Score  0  0 0  Difficult doing work/chores  Not difficult at all  Not difficult at all Not difficult at all       02/28/2024    9:47 AM 02/06/2024   11:01 AM 12/21/2023   10:02 AM 09/13/2023    9:31 AM  GAD 7 : Generalized Anxiety Score  Nervous, Anxious, on Edge 0 0 0 0  Control/stop worrying 0 0 0 0  Worry too much - different things 0 0 0 0  Trouble relaxing 0 0 0 0  Restless 0 0 0 0  Easily annoyed or irritable 0 0 0 0  Afraid - awful might happen 0 0 0 0  Total GAD 7 Score 0 0 0 0  Anxiety Difficulty Not difficult at all   Not difficult at all   Pertinent labs & imaging results that were available during my care of the patient were reviewed by me and considered in my medical decision making.  Assessment & Plan:  Rhonda Payne was seen today for medical management of chronic issues.  Diagnoses and all orders  for this visit:  1. Primary hypertension (Primary) Not at goal in office. Encouraged patient to check BP at home. Given history of renal impairment, would like patient to stop hydrochlorothiazide. Patient can continue losartan  as long as she does not continue to have reactions. Discussed red flags with patient. Encouraged her to follow up in 2-4 weeks for repeat CMP with losartan restart and to discuss increasing dose.   2. Chronic renal impairment, unspecified CKD stage Established with bhutani, MD. Patient to follow up with specialty.   3. Dyslipidemia Reviewed ASCVD risk score. Continue current regimen.    Continue all other maintenance medications.  Follow up plan: Return in about 4 weeks (around 03/27/2024) for Chronic Condition Follow up.   Continue healthy lifestyle choices, including diet (rich in fruits, vegetables, and lean proteins, and low in salt and simple carbohydrates) and exercise (at least 30 minutes of moderate physical activity daily).  Written and verbal instructions provided   The above assessment and management plan was discussed with the patient. The patient verbalized understanding of and has agreed to the management plan. Patient is aware to call the clinic if they develop any new symptoms or if symptoms persist or worsen. Patient is aware when to return to the clinic for a follow-up visit. Patient educated on when it is appropriate to go to the emergency department.   Neale Burly, DNP-FNP [facility] [address] [place], [place] [address] [phone]

## 2024-02-28 NOTE — Patient Instructions (Signed)

## 2024-03-12 ENCOUNTER — Other Ambulatory Visit: Payer: Self-pay | Admitting: Family Medicine

## 2024-03-12 DIAGNOSIS — E785 Hyperlipidemia, unspecified: Secondary | ICD-10-CM

## 2024-03-19 ENCOUNTER — Encounter: Payer: Self-pay | Admitting: Family Medicine

## 2024-03-19 DIAGNOSIS — I10 Essential (primary) hypertension: Secondary | ICD-10-CM

## 2024-03-20 MED ORDER — AMLODIPINE BESYLATE 10 MG PO TABS: 10.0000 mg | ORAL_TABLET | Freq: Every day | ORAL | 0 refills | Status: DC

## 2024-03-27 ENCOUNTER — Ambulatory Visit (INDEPENDENT_AMBULATORY_CARE_PROVIDER_SITE_OTHER): Admitting: Family Medicine

## 2024-03-27 ENCOUNTER — Encounter: Payer: Self-pay | Admitting: Family Medicine

## 2024-03-27 VITALS — BP 162/90 | HR 72 | Temp 97.8°F | Ht 65.0 in | Wt 255.0 lb

## 2024-03-27 DIAGNOSIS — N1831 Chronic kidney disease, stage 3a: Secondary | ICD-10-CM

## 2024-03-27 DIAGNOSIS — I10 Essential (primary) hypertension: Secondary | ICD-10-CM | POA: Diagnosis not present

## 2024-03-27 MED ORDER — LISINOPRIL 10 MG PO TABS: 10.0000 mg | ORAL_TABLET | Freq: Every day | ORAL | 0 refills | Status: DC

## 2024-03-27 MED ORDER — HYDROCHLOROTHIAZIDE 25 MG PO TABS: 12.5000 mg | ORAL_TABLET | Freq: Every day | ORAL | Status: DC

## 2024-03-27 NOTE — Progress Notes (Signed)
 Subjective:  Patient ID: Rhonda Payne, female    DOB: [birth year], 68 y.o.   MRN: [id number]  Patient Care Team: Ellamae Sia Aleen Campi, FNP as PCP - General (Family Medicine)   Chief Complaint:  Medical Management of Chronic Issues (B/p and cholesterol)   HPI: Rhonda Payne is a 68 y.o. female presenting on 03/27/2024 for Medical Management of Chronic Issues (B/p and cholesterol)  HPI 1. Primary hypertension Recently tried to increase losartan and had rash again. Instructed to stop losartan and restart amlodipine at night to mitigate side effect of fatigue. States that even if she is taking it at night it making her feel sleepy and tired. She is checking her BP at home. She has the omron BP monitor. 130-147/80-85 at home. She states that she is anxious right now so she wonders if her BP is up. She has been taking amlodipine for 3 days. She admits to restarting hydrochlorothiazide due to having some fluid. States that she is taking the 25 mg tablet daily. She states that she does not remember having a bad reaction to lisinopril.   Relevant past medical, surgical, family, and social history reviewed and updated as indicated.  Allergies and medications reviewed and updated. Data reviewed: Chart in Epic.   Past Medical History:  Diagnosis Date   Anemia    History of blood transfusion    "when I was a child; related to sickle cell trait"   Hypertension    Sickle cell trait (HCC)    Small bowel obstruction (HCC) 07/22/2023    Past Surgical History:  Procedure Laterality Date   CHOLECYSTECTOMY N/A 03/20/2015   Procedure: LAPAROSCOPIC CHOLECYSTECTOMY WITH INTRAOPERATIVE CHOLANGIOGRAM;  Surgeon: Ovidio Kin, MD;  Location: [facility] OR;  Service: General;  Laterality: N/A;   DILATION AND CURETTAGE OF UTERUS  1990's   INCISIONAL HERNIA REPAIR N/A 07/31/2019   Procedure: OPEN INCISIONAL HERNIA REPAIR WITH MESH;  Surgeon: Harriette Bouillon, MD;  Location: [facility] OR;  Service: General;  Laterality:  N/A;   LAPAROSCOPIC CHOLECYSTECTOMY  03/20/2015   w/IOC   LAPAROSCOPY N/A 07/23/2019   Procedure: laparotomy with hernia repair with mesh;  Surgeon: Axel Filler, MD;  Location: [facility] OR;  Service: General;  Laterality: N/A;   VAGINAL HYSTERECTOMY  1990's    Social History   Socioeconomic History   Marital status: Widowed    Spouse name: Not on file   Number of children: Not on file   Years of education: Not on file   Highest education level: Not on file  Occupational History   Not on file  Tobacco Use   Smoking status: Never   Smokeless tobacco: Never  Vaping Use   Vaping status: Never Used  Substance and Sexual Activity   Alcohol use: No   Drug use: No   Sexual activity: Never  Other Topics Concern   Not on file  Social History Narrative   Not on file   Social Drivers of Health   Financial Resource Strain: Low Risk  (12/20/2023)   Overall Financial Resource Strain (CARDIA)    Difficulty of Paying Living Expenses: Not hard at all  Food Insecurity: No Food Insecurity (12/20/2023)   Hunger Vital Sign    Worried About Running Out of Food in the Last Year: Never true    Ran Out of Food in the Last Year: Never true  Transportation Needs: No Transportation Needs (12/20/2023)   PRAPARE - Transportation    Lack of Transportation (Medical): No    Lack  of Transportation (Non-Medical): No  Physical Activity: Insufficiently Active (09/13/2023)   Exercise Vital Sign    Days of Exercise per Week: 4 days    Minutes of Exercise per Session: 30 min  Stress: No Stress Concern Present (12/20/2023)   [employer] of Occupational Health - Occupational Stress Questionnaire    Feeling of Stress : Not at all  Social Connections: Unknown (12/20/2023)   Social Connection and Isolation Panel [NHANES]    Frequency of Communication with Friends and Family: More than three times a week    Frequency of Social Gatherings with Friends and Family: Three times a week    Attends Religious Services:  Patient declined    Active Member of Clubs or Organizations: Patient declined    Attends Banker Meetings: Not on file    Marital Status: Widowed  Intimate Partner Violence: Not At Risk (09/13/2023)   Humiliation, Afraid, Rape, and Kick questionnaire    Fear of Current or Ex-Partner: No    Emotionally Abused: No    Physically Abused: No    Sexually Abused: No    Outpatient Encounter Medications as of 03/27/2024  Medication Sig   amLODipine (NORVASC) 10 MG tablet Take 1 tablet (10 mg total) by mouth daily.   Cholecalciferol (VITAMIN D3 PO) Take 1 tablet by mouth daily.    docusate sodium (COLACE) 100 MG capsule Take 1 capsule (100 mg total) by mouth 2 (two) times daily.   levocetirizine (XYZAL ALLERGY 24HR) 5 MG tablet Take 1 tablet (5 mg total) by mouth every evening.   rosuvastatin (CRESTOR) 10 MG tablet Take 1 tablet by mouth once daily   No facility-administered encounter medications on file as of 03/27/2024.    Allergies  Allergen Reactions   Hydrocodone-Acetaminophen Hives   Losartan Hives   Aspirin Rash and Other (See Comments)    Caused pain and rash    Review of Systems As per HPI  Objective:  BP (!) 162/90   Pulse 72   Temp 97.8 F (36.6 C)   Ht 5\' 5"  (1.651 m)   Wt 255 lb (115.7 kg)   SpO2 97%   BMI 42.43 kg/m    Wt Readings from Last 3 Encounters:  03/27/24 255 lb (115.7 kg)  02/28/24 255 lb (115.7 kg)  02/22/24 253 lb 12.8 oz (115.1 kg)   Physical Exam Constitutional:      General: She is awake. She is not in acute distress.    Appearance: Normal appearance. She is well-developed and well-groomed. She is obese. She is not ill-appearing, toxic-appearing or diaphoretic.  Cardiovascular:     Rate and Rhythm: Normal rate and regular rhythm.     Pulses: Normal pulses.          Radial pulses are 2+ on the right side and 2+ on the left side.       Posterior tibial pulses are 2+ on the right side and 2+ on the left side.     Heart sounds: Normal  heart sounds. No murmur heard.    No gallop.  Pulmonary:     Effort: Pulmonary effort is normal. No respiratory distress.     Breath sounds: Normal breath sounds. No stridor. No wheezing, rhonchi or rales.  Musculoskeletal:     Cervical back: Full passive range of motion without pain and neck supple.     Right lower leg: No edema.     Left lower leg: No edema.  Skin:    General: Skin is warm.  Capillary Refill: Capillary refill takes less than 2 seconds.  Neurological:     General: No focal deficit present.     Mental Status: She is alert, oriented to person, place, and time and easily aroused. Mental status is at baseline.     GCS: GCS eye subscore is 4. GCS verbal subscore is 5. GCS motor subscore is 6.     Motor: No weakness.  Psychiatric:        Attention and Perception: Attention and perception normal.        Mood and Affect: Mood and affect normal.        Speech: Speech normal.        Behavior: Behavior normal. Behavior is cooperative.        Thought Content: Thought content normal. Thought content does not include homicidal or suicidal ideation. Thought content does not include homicidal or suicidal plan.        Cognition and Memory: Cognition and memory normal.        Judgment: Judgment normal.    Results for orders placed or performed in visit on 12/21/23  CMP14+EGFR   Collection Time: 12/21/23 10:37 AM  Result Value Ref Range   Glucose 94 70 - 99 mg/dL   BUN 11 8 - 27 mg/dL   Creatinine, Ser 5.36 (H) 0.57 - 1.00 mg/dL   eGFR 53 (L) >64 QI/HKV/4.25   BUN/Creatinine Ratio 10 (L) 12 - 28   Sodium 146 (H) 134 - 144 mmol/L   Potassium 4.4 3.5 - 5.2 mmol/L   Chloride 105 96 - 106 mmol/L   CO2 24 20 - 29 mmol/L   Calcium 9.3 8.7 - 10.3 mg/dL   Total Protein 7.1 6.0 - 8.5 g/dL   Albumin 4.0 3.9 - 4.9 g/dL   Globulin, Total 3.1 1.5 - 4.5 g/dL   Bilirubin Total 1.0 0.0 - 1.2 mg/dL   Alkaline Phosphatase 64 44 - 121 IU/L   AST 23 0 - 40 IU/L   ALT 11 0 - 32 IU/L  CBC  with Differential/Platelet   Collection Time: 12/21/23 10:37 AM  Result Value Ref Range   WBC 4.2 3.4 - 10.8 x10E3/uL   RBC 4.90 3.77 - 5.28 x10E6/uL   Hemoglobin 12.9 11.1 - 15.9 g/dL   Hematocrit 95.6 38.7 - 46.6 %   MCV 82 79 - 97 fL   MCH 26.3 (L) 26.6 - 33.0 pg   MCHC 32.0 31.5 - 35.7 g/dL   RDW 56.4 33.2 - 95.1 %   Platelets 183 150 - 450 x10E3/uL   Neutrophils 62 Not Estab. %   Lymphs 29 Not Estab. %   Monocytes 6 Not Estab. %   Eos 2 Not Estab. %   Basos 1 Not Estab. %   Neutrophils Absolute 2.6 1.4 - 7.0 x10E3/uL   Lymphocytes Absolute 1.2 0.7 - 3.1 x10E3/uL   Monocytes Absolute 0.3 0.1 - 0.9 x10E3/uL   EOS (ABSOLUTE) 0.1 0.0 - 0.4 x10E3/uL   Basophils Absolute 0.0 0.0 - 0.2 x10E3/uL   Immature Granulocytes 0 Not Estab. %   Immature Grans (Abs) 0.0 0.0 - 0.1 x10E3/uL  Lipid panel   Collection Time: 12/21/23 10:37 AM  Result Value Ref Range   Cholesterol, Total 117 100 - 199 mg/dL   Triglycerides 70 0 - 149 mg/dL   HDL 54 >88 mg/dL   VLDL Cholesterol Cal 15 5 - 40 mg/dL   LDL Chol Calc (NIH) 48 0 - 99 mg/dL   Chol/HDL Ratio 2.2 0.0 -  4.4 ratio       03/27/2024    9:41 AM 02/28/2024    9:47 AM 02/06/2024   11:01 AM 12/21/2023   10:01 AM 09/13/2023    9:31 AM  Depression screen PHQ 2/9  Decreased Interest 0 0 0 0 0  Down, Depressed, Hopeless  0 0 0 0  PHQ - 2 Score 0 0 0 0 0  Altered sleeping   0  0  Tired, decreased energy   0  0  Change in appetite   0  0  Feeling bad or failure about yourself    0  0  Trouble concentrating   0  0  Moving slowly or fidgety/restless   0  0  Suicidal thoughts   0  0  PHQ-9 Score   0  0  Difficult doing work/chores   Not difficult at all  Not difficult at all       03/27/2024    9:41 AM 02/28/2024    9:47 AM 02/06/2024   11:01 AM 12/21/2023   10:02 AM  GAD 7 : Generalized Anxiety Score  Nervous, Anxious, on Edge 0 0 0 0  Control/stop worrying 0 0 0 0  Worry too much - different things 0 0 0 0  Trouble relaxing 0 0 0 0   Restless 0 0 0 0  Easily annoyed or irritable 0 0 0 0  Afraid - awful might happen 0 0 0 0  Total GAD 7 Score 0 0 0 0  Anxiety Difficulty Not difficult at all Not difficult at all     Pertinent labs & imaging results that were available during my care of the patient were reviewed by me and considered in my medical decision making.  Assessment & Plan:  Kaidyn was seen today for medical management of chronic issues.  Diagnoses and all orders for this visit:  Primary hypertension Will restart lisinopril as below as this was previously tolerated by patient. Will stop amlodipine. Do not wish for patient to continue hydrochlorothiazide, however will add back to list as she admits that she is still taking it. She is to follow up with Wolfgang Phoenix, MD next month. Will have close follow up and completed CMP in 2 weeks.  -     lisinopril (ZESTRIL) 10 MG tablet; Take 1 tablet (10 mg total) by mouth daily. -     hydrochlorothiazide (HYDRODIURIL) 25 MG tablet; Take 0.5 tablets (12.5 mg total) by mouth daily.  Chronic renal impairment, stage 3a (HCC) As above.   Continue all other maintenance medications.  Follow up plan: Return in about 2 weeks (around 04/10/2024) for BP follow up . With labs    Continue healthy lifestyle choices, including diet (rich in fruits, vegetables, and lean proteins, and low in salt and simple carbohydrates) and exercise (at least 30 minutes of moderate physical activity daily).  Written and verbal instructions provided   The above assessment and management plan was discussed with the patient. The patient verbalized understanding of and has agreed to the management plan. Patient is aware to call the clinic if they develop any new symptoms or if symptoms persist or worsen. Patient is aware when to return to the clinic for a follow-up visit. Patient educated on when it is appropriate to go to the emergency department.   Neale Burly, DNP-FNP [facility]  Medicine [address] [place], [place] [address] [phone]

## 2024-03-27 NOTE — Patient Instructions (Addendum)
 [facility] - Dr. Carrolyn Clan [address], [place] [address] [phone]

## 2024-04-02 ENCOUNTER — Other Ambulatory Visit: Payer: Medicare Other

## 2024-04-10 ENCOUNTER — Ambulatory Visit: Admitting: Family Medicine

## 2024-04-17 ENCOUNTER — Encounter: Payer: Self-pay | Admitting: Family Medicine

## 2024-04-17 ENCOUNTER — Ambulatory Visit (INDEPENDENT_AMBULATORY_CARE_PROVIDER_SITE_OTHER): Admitting: Family Medicine

## 2024-04-17 VITALS — BP 165/87 | HR 61 | Temp 98.2°F | Ht 65.0 in | Wt 251.0 lb

## 2024-04-17 DIAGNOSIS — I10 Essential (primary) hypertension: Secondary | ICD-10-CM | POA: Diagnosis not present

## 2024-04-17 NOTE — Progress Notes (Addendum)
 Subjective:  Patient ID: Rhonda Payne, female    DOB: [birth year], 68 y.o.   MRN: [id number]  Patient Care Team: Rosalynn Come Winda Hastings, FNP as PCP - General (Family Medicine)   Chief Complaint:  Medical Management of Chronic Issues (labwork)   HPI: Rhonda Payne is a 68 y.o. female presenting on 04/17/2024 for Medical Management of Chronic Issues (labwork)  HPI States that she tried the lisinopril . States that it caused her to have headache and facial pain, swelling, redness after 3 days. Stopped it and resolved after 3-4 days. She has tried amlodipine , had fatigue. Tried losartan , had hives. She is still taking hctz. However, she had CKD. She has an appt with Dr. Carrolyn Clan on 05/10/24. She has the omron BP monitor at home. States that she was taking it at home, but it was making her upset and anxious, so she stopped. She is trying to walk more. States that she has had clonidine in the past and that did not work for her as well.   Of note, patient's niece passed away this morning from complications of leukemia.   Relevant past medical, surgical, family, and social history reviewed and updated as indicated.  Allergies and medications reviewed and updated. Data reviewed: Chart in Epic.   Past Medical History:  Diagnosis Date   Anemia    History of blood transfusion    "when I was a child; related to sickle cell trait"   Hypertension    Sickle cell trait (HCC)    Small bowel obstruction (HCC) 07/22/2023    Past Surgical History:  Procedure Laterality Date   CHOLECYSTECTOMY N/A 03/20/2015   Procedure: LAPAROSCOPIC CHOLECYSTECTOMY WITH INTRAOPERATIVE CHOLANGIOGRAM;  Surgeon: Juanita Norlander, MD;  Location: [facility] OR;  Service: General;  Laterality: N/A;   DILATION AND CURETTAGE OF UTERUS  1990's   INCISIONAL HERNIA REPAIR N/A 07/31/2019   Procedure: OPEN INCISIONAL HERNIA REPAIR WITH MESH;  Surgeon: Sim Dryer, MD;  Location: [facility] OR;  Service: General;  Laterality: N/A;    LAPAROSCOPIC CHOLECYSTECTOMY  03/20/2015   w/IOC   LAPAROSCOPY N/A 07/23/2019   Procedure: laparotomy with hernia repair with mesh;  Surgeon: Shela Derby, MD;  Location: [facility] OR;  Service: General;  Laterality: N/A;   VAGINAL HYSTERECTOMY  1990's    Social History   Socioeconomic History   Marital status: Widowed    Spouse name: Not on file   Number of children: Not on file   Years of education: Not on file   Highest education level: Not on file  Occupational History   Not on file  Tobacco Use   Smoking status: Never   Smokeless tobacco: Never  Vaping Use   Vaping status: Never Used  Substance and Sexual Activity   Alcohol use: No   Drug use: No   Sexual activity: Never  Other Topics Concern   Not on file  Social History Narrative   Not on file   Social Drivers of Health   Financial Resource Strain: Low Risk  (12/20/2023)   Overall Financial Resource Strain (CARDIA)    Difficulty of Paying Living Expenses: Not hard at all  Food Insecurity: No Food Insecurity (12/20/2023)   Hunger Vital Sign    Worried About Running Out of Food in the Last Year: Never true    Ran Out of Food in the Last Year: Never true  Transportation Needs: No Transportation Needs (12/20/2023)   PRAPARE - Administrator, Civil Service (Medical): No  Lack of Transportation (Non-Medical): No  Physical Activity: Insufficiently Active (09/13/2023)   Exercise Vital Sign    Days of Exercise per Week: 4 days    Minutes of Exercise per Session: 30 min  Stress: No Stress Concern Present (12/20/2023)   [employer] of Occupational Health - Occupational Stress Questionnaire    Feeling of Stress : Not at all  Social Connections: Unknown (12/20/2023)   Social Connection and Isolation Panel [NHANES]    Frequency of Communication with Friends and Family: More than three times a week    Frequency of Social Gatherings with Friends and Family: Three times a week    Attends Religious Services: Patient  declined    Active Member of Clubs or Organizations: Patient declined    Attends Banker Meetings: Not on file    Marital Status: Widowed  Intimate Partner Violence: Not At Risk (09/13/2023)   Humiliation, Afraid, Rape, and Kick questionnaire    Fear of Current or Ex-Partner: No    Emotionally Abused: No    Physically Abused: No    Sexually Abused: No    Outpatient Encounter Medications as of 04/17/2024  Medication Sig   Cholecalciferol (VITAMIN D3 PO) Take 1 tablet by mouth daily.    docusate sodium  (COLACE) 100 MG capsule Take 1 capsule (100 mg total) by mouth 2 (two) times daily.   hydrochlorothiazide  (HYDRODIURIL ) 25 MG tablet Take 0.5 tablets (12.5 mg total) by mouth daily.   levocetirizine (XYZAL  ALLERGY 24HR) 5 MG tablet Take 1 tablet (5 mg total) by mouth every evening.   lisinopril  (ZESTRIL ) 10 MG tablet Take 1 tablet (10 mg total) by mouth daily.   rosuvastatin  (CRESTOR ) 10 MG tablet Take 1 tablet by mouth once daily   No facility-administered encounter medications on file as of 04/17/2024.    Allergies  Allergen Reactions   Hydrocodone -Acetaminophen  Hives   Losartan  Hives   Aspirin Rash and Other (See Comments)    Caused pain and rash    Review of Systems As per HPI  Objective:  BP (!) 165/87   Pulse 61   Temp 98.2 F (36.8 C)   Ht 5\' 5"  (1.651 m)   Wt 251 lb (113.9 kg)   SpO2 97%   BMI 41.77 kg/m    Wt Readings from Last 3 Encounters:  04/17/24 251 lb (113.9 kg)  03/27/24 255 lb (115.7 kg)  02/28/24 255 lb (115.7 kg)   Physical Exam Constitutional:      General: She is awake. She is not in acute distress.    Appearance: Normal appearance. She is well-developed and well-groomed. She is obese. She is not ill-appearing, toxic-appearing or diaphoretic.  Cardiovascular:     Rate and Rhythm: Normal rate and regular rhythm.     Pulses: Normal pulses.          Radial pulses are 2+ on the right side and 2+ on the left side.       Posterior tibial  pulses are 2+ on the right side and 2+ on the left side.     Heart sounds: Normal heart sounds. No murmur heard.    No gallop.  Pulmonary:     Effort: Pulmonary effort is normal. No respiratory distress.     Breath sounds: Normal breath sounds. No stridor. No wheezing, rhonchi or rales.  Musculoskeletal:     Cervical back: Full passive range of motion without pain and neck supple.     Right lower leg: No edema.  Left lower leg: No edema.  Skin:    General: Skin is warm.     Capillary Refill: Capillary refill takes less than 2 seconds.  Neurological:     General: No focal deficit present.     Mental Status: She is alert, oriented to person, place, and time and easily aroused. Mental status is at baseline.     GCS: GCS eye subscore is 4. GCS verbal subscore is 5. GCS motor subscore is 6.     Motor: No weakness.  Psychiatric:        Attention and Perception: Attention and perception normal.        Mood and Affect: Mood and affect normal.        Speech: Speech normal.        Behavior: Behavior normal. Behavior is cooperative.        Thought Content: Thought content normal. Thought content does not include homicidal or suicidal ideation. Thought content does not include homicidal or suicidal plan.        Cognition and Memory: Cognition and memory normal.        Judgment: Judgment normal.      Results for orders placed or performed in visit on 12/21/23  CMP14+EGFR   Collection Time: 12/21/23 10:37 AM  Result Value Ref Range   Glucose 94 70 - 99 mg/dL   BUN 11 8 - 27 mg/dL   Creatinine, Ser 4.09 (H) 0.57 - 1.00 mg/dL   eGFR 53 (L) >81 XB/JYN/8.29   BUN/Creatinine Ratio 10 (L) 12 - 28   Sodium 146 (H) 134 - 144 mmol/L   Potassium 4.4 3.5 - 5.2 mmol/L   Chloride 105 96 - 106 mmol/L   CO2 24 20 - 29 mmol/L   Calcium  9.3 8.7 - 10.3 mg/dL   Total Protein 7.1 6.0 - 8.5 g/dL   Albumin 4.0 3.9 - 4.9 g/dL   Globulin, Total 3.1 1.5 - 4.5 g/dL   Bilirubin Total 1.0 0.0 - 1.2 mg/dL    Alkaline Phosphatase 64 44 - 121 IU/L   AST 23 0 - 40 IU/L   ALT 11 0 - 32 IU/L  CBC with Differential/Platelet   Collection Time: 12/21/23 10:37 AM  Result Value Ref Range   WBC 4.2 3.4 - 10.8 x10E3/uL   RBC 4.90 3.77 - 5.28 x10E6/uL   Hemoglobin 12.9 11.1 - 15.9 g/dL   Hematocrit 56.2 13.0 - 46.6 %   MCV 82 79 - 97 fL   MCH 26.3 (L) 26.6 - 33.0 pg   MCHC 32.0 31.5 - 35.7 g/dL   RDW 86.5 78.4 - 69.6 %   Platelets 183 150 - 450 x10E3/uL   Neutrophils 62 Not Estab. %   Lymphs 29 Not Estab. %   Monocytes 6 Not Estab. %   Eos 2 Not Estab. %   Basos 1 Not Estab. %   Neutrophils Absolute 2.6 1.4 - 7.0 x10E3/uL   Lymphocytes Absolute 1.2 0.7 - 3.1 x10E3/uL   Monocytes Absolute 0.3 0.1 - 0.9 x10E3/uL   EOS (ABSOLUTE) 0.1 0.0 - 0.4 x10E3/uL   Basophils Absolute 0.0 0.0 - 0.2 x10E3/uL   Immature Granulocytes 0 Not Estab. %   Immature Grans (Abs) 0.0 0.0 - 0.1 x10E3/uL  Lipid panel   Collection Time: 12/21/23 10:37 AM  Result Value Ref Range   Cholesterol, Total 117 100 - 199 mg/dL   Triglycerides 70 0 - 149 mg/dL   HDL 54 >29 mg/dL   VLDL Cholesterol Cal 15 5 -  40 mg/dL   LDL Chol Calc (NIH) 48 0 - 99 mg/dL   Chol/HDL Ratio 2.2 0.0 - 4.4 ratio       04/17/2024   10:22 AM 03/27/2024    9:41 AM 02/28/2024    9:47 AM 02/06/2024   11:01 AM 12/21/2023   10:01 AM  Depression screen PHQ 2/9  Decreased Interest 0 0 0 0 0  Down, Depressed, Hopeless 0  0 0 0  PHQ - 2 Score 0 0 0 0 0  Altered sleeping    0   Tired, decreased energy    0   Change in appetite    0   Feeling bad or failure about yourself     0   Trouble concentrating    0   Moving slowly or fidgety/restless    0   Suicidal thoughts    0   PHQ-9 Score    0   Difficult doing work/chores    Not difficult at all        04/17/2024   10:22 AM 03/27/2024    9:41 AM 02/28/2024    9:47 AM 02/06/2024   11:01 AM  GAD 7 : Generalized Anxiety Score  Nervous, Anxious, on Edge 0 0 0 0  Control/stop worrying 0 0 0 0  Worry too much  - different things 0 0 0 0  Trouble relaxing 0 0 0 0  Restless 0 0 0 0  Easily annoyed or irritable 0 0 0 0  Afraid - awful might happen 0 0 0 0  Total GAD 7 Score 0 0 0 0  Anxiety Difficulty Not difficult at all Not difficult at all Not difficult at all    Pertinent labs & imaging results that were available during my care of the patient were reviewed by me and considered in my medical decision making.  Assessment & Plan:  Tyger was seen today for medical management of chronic issues.  Diagnoses and all orders for this visit:  Primary hypertension Patient not at goal. Will reach out to pharmacy and to [facility], MD for recommendations. May consider referral to cardiology   Continue all other maintenance medications.  Follow up plan: Return in about 4 weeks (around 05/15/2024).   Continue healthy lifestyle choices, including diet (rich in fruits, vegetables, and lean proteins, and low in salt and simple carbohydrates) and exercise (at least 30 minutes of moderate physical activity daily).  Written and verbal instructions provided   The above assessment and management plan was discussed with the patient. The patient verbalized understanding of and has agreed to the management plan. Patient is aware to call the clinic if they develop any new symptoms or if symptoms persist or worsen. Patient is aware when to return to the clinic for a follow-up visit. Patient educated on when it is appropriate to go to the emergency department.   Jacqualyn Mates, DNP-FNP [facility] [address] [place], [place] [address] [phone]

## 2024-04-18 ENCOUNTER — Encounter: Payer: Self-pay | Admitting: Family Medicine

## 2024-04-19 ENCOUNTER — Telehealth: Payer: Self-pay | Admitting: Family Medicine

## 2024-04-19 MED ORDER — AMLODIPINE BESYLATE 2.5 MG PO TABS: 2.5000 mg | ORAL_TABLET | Freq: Every day | ORAL | 0 refills | Status: DC

## 2024-04-19 NOTE — Telephone Encounter (Signed)
 Please call patient and tell her that I spoke with Dr. Carrolyn Clan and he recommended retrying amlodipine  at 2.5 mg MWF at night to see if she can tolerate that. I will send in medication to [facility] in [place].

## 2024-04-19 NOTE — Telephone Encounter (Signed)
 Left a detailed message per signed DPR. I advised patient to call back or send us  a mychart message if she has any questions or if she is able to tolerate amlodopine.

## 2024-04-25 ENCOUNTER — Encounter: Payer: Self-pay | Admitting: Pharmacist

## 2024-04-25 ENCOUNTER — Other Ambulatory Visit (INDEPENDENT_AMBULATORY_CARE_PROVIDER_SITE_OTHER)

## 2024-04-25 DIAGNOSIS — I10 Essential (primary) hypertension: Secondary | ICD-10-CM

## 2024-04-25 NOTE — Progress Notes (Signed)
   04/25/2024 Name: Rhonda Payne MRN: [id number] DOB: 1956/03/15  Chief Complaint  Patient presents with   Hypertension    Rhonda Payne is a 68 y.o. year old female who presented for a telephone visit.   They were referred to the pharmacist by their PCP for assistance in managing hypertension.   Subjective:  Reports difficulty with adjusting to certain medications for hypertension.  She reports hives with Losartan  and reactions (facial swelling/redness) with Lisinopril . Her recent blood pressures have been 130-140s over 80-90.   Care Team: Primary Care Provider: Chrystine Crate, FNP ; Next Scheduled Visit: 05/2024    Medication Access/Adherence  Current Pharmacy:  [facility] [address], [place] - [address] [place] [address] Phone: [phone] Fax: [phone]  Patient reports affordability concerns with their medications: No  Patient reports access/transportation concerns to their pharmacy: No  Patient reports adherence concerns with their medications:  No    Hypertension:  Current medications: amlodipine  2.5mg  daily   Medications previously tried: combo lisinopril /hydrochlorothiazide , clonidine, losartan   Patient has a validated, automated, upper arm home BP cuff Current blood pressure readings readings: 160s/80-90s  Patient denies hypotensive s/sx including dizziness, lightheadedness.   Current meal patterns: encouraged low salt  Current physical activity: increase as able   Objective:  Lab Results  Component Value Date   HGBA1C 5.6 04/06/2023    Lab Results  Component Value Date   CREATININE 1.14 (H) 12/21/2023   BUN 11 12/21/2023   NA 146 (H) 12/21/2023   K 4.4 12/21/2023   CL 105 12/21/2023   CO2 24 12/21/2023    Lab Results  Component Value Date   CHOL 117 12/21/2023   HDL 54 12/21/2023   LDLCALC 48 12/21/2023   TRIG 70 12/21/2023   CHOLHDL 2.2 12/21/2023    Medications Reviewed Today      Reviewed by Delilah Fend, [facility] (Pharmacist) on 05/16/24 at 1235  Med List Status: <None>   Medication Order Taking? Sig Documenting Provider Last Dose Status Informant  amLODipine  (NORVASC ) 2.5 MG tablet [id number]  Take 1 tablet by mouth once daily Milian, Winda Hastings, FNP  Active   Cholecalciferol (VITAMIN D3 PO) [id number] No Take 1 tablet by mouth daily.  [provider] Taking Active Self  docusate sodium  (COLACE) 100 MG capsule [id number] No Take 1 capsule (100 mg total) by mouth 2 (two) times daily. Chrystine Crate, FNP Taking Active   hydrochlorothiazide  (HYDRODIURIL ) 25 MG tablet [id number] No Take 0.5 tablets (12.5 mg total) by mouth daily. Chrystine Crate, FNP Taking Active   levocetirizine (XYZAL  ALLERGY 24HR) 5 MG tablet [id number] No Take 1 tablet (5 mg total) by mouth every evening. Del Amber Bail, Rogerio Clay, FNP Taking Active   rosuvastatin  (CRESTOR ) 10 MG tablet [id number] No Take 1 tablet by mouth once daily Milian, Winda Hastings, FNP Taking Active               Assessment/Plan:   Hypertension: - Currently uncontrolled - Reviewed long term cardiovascular and renal outcomes of uncontrolled blood pressure - Reviewed appropriate blood pressure monitoring technique and reviewed goal blood pressure. Recommended to check home blood pressure and heart rate daily at home or if symptomatic - Recommend to : Continue amlodipine  2.5mg  daily START hydrochlorothiazide  12.5mg  daily (was previously on 25mg )    Follow Up Plan: 1 month PCP 05/2024  Marvell Slider, PharmD, BCACP, CPP Clinical Pharmacist, [facility]

## 2024-05-03 DIAGNOSIS — R809 Proteinuria, unspecified: Secondary | ICD-10-CM | POA: Diagnosis not present

## 2024-05-03 DIAGNOSIS — N189 Chronic kidney disease, unspecified: Secondary | ICD-10-CM | POA: Diagnosis not present

## 2024-05-03 DIAGNOSIS — D631 Anemia in chronic kidney disease: Secondary | ICD-10-CM | POA: Diagnosis not present

## 2024-05-05 ENCOUNTER — Other Ambulatory Visit: Payer: Self-pay | Admitting: Family Medicine

## 2024-05-05 DIAGNOSIS — I10 Essential (primary) hypertension: Secondary | ICD-10-CM

## 2024-05-10 DIAGNOSIS — I129 Hypertensive chronic kidney disease with stage 1 through stage 4 chronic kidney disease, or unspecified chronic kidney disease: Secondary | ICD-10-CM | POA: Diagnosis not present

## 2024-05-10 DIAGNOSIS — N281 Cyst of kidney, acquired: Secondary | ICD-10-CM | POA: Diagnosis not present

## 2024-05-10 DIAGNOSIS — K862 Cyst of pancreas: Secondary | ICD-10-CM | POA: Diagnosis not present

## 2024-05-10 DIAGNOSIS — N182 Chronic kidney disease, stage 2 (mild): Secondary | ICD-10-CM | POA: Diagnosis not present

## 2024-05-11 ENCOUNTER — Other Ambulatory Visit: Payer: Self-pay | Admitting: Family Medicine

## 2024-05-15 ENCOUNTER — Telehealth: Payer: Self-pay | Admitting: Pharmacist

## 2024-05-15 NOTE — Telephone Encounter (Signed)
   This patient is appearing on a report for being at risk of failing the adherence measure for hypertension (ACEi/ARB) medications this calendar year.   Medication: lisinopril  Last fill date: 03/27/24 for 30 day supply  Not refilled Failed measure

## 2024-05-24 ENCOUNTER — Encounter: Payer: Self-pay | Admitting: Family Medicine

## 2024-05-24 ENCOUNTER — Ambulatory Visit (INDEPENDENT_AMBULATORY_CARE_PROVIDER_SITE_OTHER): Admitting: Family Medicine

## 2024-05-24 VITALS — BP 159/79 | HR 68 | Temp 98.2°F | Ht 65.0 in | Wt 255.0 lb

## 2024-05-24 DIAGNOSIS — K862 Cyst of pancreas: Secondary | ICD-10-CM | POA: Diagnosis not present

## 2024-05-24 DIAGNOSIS — K7689 Other specified diseases of liver: Secondary | ICD-10-CM

## 2024-05-24 DIAGNOSIS — I10 Essential (primary) hypertension: Secondary | ICD-10-CM

## 2024-05-24 DIAGNOSIS — N1831 Chronic kidney disease, stage 3a: Secondary | ICD-10-CM

## 2024-05-24 DIAGNOSIS — N281 Cyst of kidney, acquired: Secondary | ICD-10-CM | POA: Diagnosis not present

## 2024-05-24 DIAGNOSIS — E785 Hyperlipidemia, unspecified: Secondary | ICD-10-CM

## 2024-05-24 MED ORDER — HYDROCHLOROTHIAZIDE 25 MG PO TABS: 12.5000 mg | ORAL_TABLET | Freq: Every day | ORAL | 1 refills | Status: AC

## 2024-05-24 MED ORDER — AMLODIPINE BESYLATE 2.5 MG PO TABS: 2.5000 mg | ORAL_TABLET | ORAL | 1 refills | Status: AC

## 2024-05-24 MED ORDER — AMLODIPINE BESYLATE 2.5 MG PO TABS: 2.5000 mg | ORAL_TABLET | Freq: Every day | ORAL | 1 refills | Status: DC

## 2024-05-24 NOTE — Progress Notes (Signed)
 Subjective:  Patient ID: Rhonda Payne, female    DOB: 1956-11-06, 68 y.o.   MRN: [id number]  Patient Care Team: Chrystine Crate, FNP as PCP - General (Family Medicine)   Chief Complaint:  Medical Management of Chronic Issues (4 week follow up)   HPI: Rhonda Payne is a 68 y.o. female presenting on 05/24/2024 for Medical Management of Chronic Issues (4 week follow up)  HPI Primary hypertension Has BP monitor at home Yes BP at home average - states that at home it is much better than here.  She reports measurements averaging 135/87. States that she will rise to 140/90 if she is having a stressful day. She is not taking amlodipine  every day. She is not having side effects as much. She is taking amlodipine  at night and still feels drowsy, but state that it is manageable. She is taking amlodipine  MWF. She is taking 1/2 hydrochlorothiazide  twice per week as needed for swelling. She is noticing some swelling, but it is controlled with hydrochlorothiazide . She is wearing compression stockings intermittently. Established with bhutani, MD. Saw him 05/03/24. Has follow up with him November.  CAD risks hypertension  Dyslipidemia States that she has cut back on sweets.  Adding in fruits.  States that her son has high BP too and they are working on it together.  She is not taking crestor . States that she was taking it with the lisinopril  and wonders if it was interacting and making her feel bad.      Relevant past medical, surgical, family, and social history reviewed and updated as indicated.  Allergies and medications reviewed and updated. Data reviewed: Chart in Epic.   Past Medical History:  Diagnosis Date   Anemia    History of blood transfusion    when I was a child; related to sickle cell trait   Hypertension    Incarcerated hernia 07/23/2019   Sickle cell trait (HCC)    Small bowel obstruction (HCC) 07/22/2023    Past Surgical History:  Procedure Laterality Date    CHOLECYSTECTOMY N/A 03/20/2015   Procedure: LAPAROSCOPIC CHOLECYSTECTOMY WITH INTRAOPERATIVE CHOLANGIOGRAM;  Surgeon: Juanita Norlander, MD;  Location: [facility] OR;  Service: General;  Laterality: N/A;   DILATION AND CURETTAGE OF UTERUS  1990's   INCISIONAL HERNIA REPAIR N/A 07/31/2019   Procedure: OPEN INCISIONAL HERNIA REPAIR WITH MESH;  Surgeon: Sim Dryer, MD;  Location: [facility] OR;  Service: General;  Laterality: N/A;   LAPAROSCOPIC CHOLECYSTECTOMY  03/20/2015   w/IOC   LAPAROSCOPY N/A 07/23/2019   Procedure: laparotomy with hernia repair with mesh;  Surgeon: Shela Derby, MD;  Location: [facility] OR;  Service: General;  Laterality: N/A;   VAGINAL HYSTERECTOMY  1990's    Social History   Socioeconomic History   Marital status: Widowed    Spouse name: Not on file   Number of children: Not on file   Years of education: Not on file   Highest education level: Not on file  Occupational History   Not on file  Tobacco Use   Smoking status: Never   Smokeless tobacco: Never  Vaping Use   Vaping status: Never Used  Substance and Sexual Activity   Alcohol use: No   Drug use: No   Sexual activity: Never  Other Topics Concern   Not on file  Social History Narrative   Not on file   Social Drivers of Health   Financial Resource Strain: Low Risk  (05/23/2024)   Overall Financial Resource Strain (CARDIA)  Difficulty of Paying Living Expenses: Not hard at all  Food Insecurity: No Food Insecurity (05/23/2024)   Hunger Vital Sign    Worried About Running Out of Food in the Last Year: Never true    Ran Out of Food in the Last Year: Never true  Transportation Needs: No Transportation Needs (05/23/2024)   PRAPARE - Administrator, Civil Service (Medical): No    Lack of Transportation (Non-Medical): No  Physical Activity: Insufficiently Active (09/13/2023)   Exercise Vital Sign    Days of Exercise per Week: 4 days    Minutes of Exercise per Session: 30 min  Stress: No Stress Concern Present  (05/23/2024)   [employer] of Occupational Health - Occupational Stress Questionnaire    Feeling of Stress: Not at all  Social Connections: Unknown (05/23/2024)   Social Connection and Isolation Panel    Frequency of Communication with Friends and Family: More than three times a week    Frequency of Social Gatherings with Friends and Family: Not on file    Attends Religious Services: Not on file    Active Member of Clubs or Organizations: No    Attends Banker Meetings: Not on file    Marital Status: Widowed  Intimate Partner Violence: Not At Risk (09/13/2023)   Humiliation, Afraid, Rape, and Kick questionnaire    Fear of Current or Ex-Partner: No    Emotionally Abused: No    Physically Abused: No    Sexually Abused: No    Outpatient Encounter Medications as of 05/24/2024  Medication Sig   amLODipine  (NORVASC ) 2.5 MG tablet Take 1 tablet by mouth once daily   Cholecalciferol (VITAMIN D3 PO) Take 1 tablet by mouth daily.    docusate sodium  (COLACE) 100 MG capsule Take 1 capsule (100 mg total) by mouth 2 (two) times daily.   hydrochlorothiazide  (HYDRODIURIL ) 25 MG tablet Take 0.5 tablets (12.5 mg total) by mouth daily.   levocetirizine (XYZAL  ALLERGY 24HR) 5 MG tablet Take 1 tablet (5 mg total) by mouth every evening.   rosuvastatin  (CRESTOR ) 10 MG tablet Take 1 tablet by mouth once daily   No facility-administered encounter medications on file as of 05/24/2024.    Allergies  Allergen Reactions   Hydrocodone -Acetaminophen  Hives   Lisinopril  Other (See Comments)    headache and facial pain, swelling, redness after 3 days   Losartan  Hives   Aspirin Rash and Other (See Comments)    Caused pain and rash    Review of Systems As per HPI  Objective:  BP (!) 159/79   Pulse 68   Temp 98.2 F (36.8 C)   Ht 5' 5 (1.651 m)   Wt 255 lb (115.7 kg)   SpO2 97%   BMI 42.43 kg/m    Wt Readings from Last 3 Encounters:  05/24/24 255 lb (115.7 kg)  04/17/24 251 lb  (113.9 kg)  03/27/24 255 lb (115.7 kg)   Physical Exam Constitutional:      General: She is awake. She is not in acute distress.    Appearance: Normal appearance. She is well-developed and well-groomed. She is obese. She is not ill-appearing, toxic-appearing or diaphoretic.   Cardiovascular:     Rate and Rhythm: Normal rate and regular rhythm.     Pulses: Normal pulses.          Radial pulses are 2+ on the right side and 2+ on the left side.       Posterior tibial pulses are 2+ on the  right side and 2+ on the left side.     Heart sounds: Normal heart sounds. No murmur heard.    No gallop.  Pulmonary:     Effort: Pulmonary effort is normal. No respiratory distress.     Breath sounds: Normal breath sounds. No stridor. No wheezing, rhonchi or rales.   Musculoskeletal:     Cervical back: Full passive range of motion without pain and neck supple.     Right lower leg: No edema.     Left lower leg: No edema.   Skin:    General: Skin is warm.     Capillary Refill: Capillary refill takes less than 2 seconds.   Neurological:     General: No focal deficit present.     Mental Status: She is alert, oriented to person, place, and time and easily aroused. Mental status is at baseline.     GCS: GCS eye subscore is 4. GCS verbal subscore is 5. GCS motor subscore is 6.     Motor: No weakness.   Psychiatric:        Attention and Perception: Attention and perception normal.        Mood and Affect: Mood and affect normal.        Speech: Speech normal.        Behavior: Behavior normal. Behavior is cooperative.        Thought Content: Thought content normal. Thought content does not include homicidal or suicidal ideation. Thought content does not include homicidal or suicidal plan.        Cognition and Memory: Cognition and memory normal.        Judgment: Judgment normal.      Results for orders placed or performed in visit on 12/21/23  CMP14+EGFR   Collection Time: 12/21/23 10:37 AM   Result Value Ref Range   Glucose 94 70 - 99 mg/dL   BUN 11 8 - 27 mg/dL   Creatinine, Ser 4.01 (H) 0.57 - 1.00 mg/dL   eGFR 53 (L) >02 VO/ZDG/6.44   BUN/Creatinine Ratio 10 (L) 12 - 28   Sodium 146 (H) 134 - 144 mmol/L   Potassium 4.4 3.5 - 5.2 mmol/L   Chloride 105 96 - 106 mmol/L   CO2 24 20 - 29 mmol/L   Calcium  9.3 8.7 - 10.3 mg/dL   Total Protein 7.1 6.0 - 8.5 g/dL   Albumin 4.0 3.9 - 4.9 g/dL   Globulin, Total 3.1 1.5 - 4.5 g/dL   Bilirubin Total 1.0 0.0 - 1.2 mg/dL   Alkaline Phosphatase 64 44 - 121 IU/L   AST 23 0 - 40 IU/L   ALT 11 0 - 32 IU/L  CBC with Differential/Platelet   Collection Time: 12/21/23 10:37 AM  Result Value Ref Range   WBC 4.2 3.4 - 10.8 x10E3/uL   RBC 4.90 3.77 - 5.28 x10E6/uL   Hemoglobin 12.9 11.1 - 15.9 g/dL   Hematocrit 03.4 74.2 - 46.6 %   MCV 82 79 - 97 fL   MCH 26.3 (L) 26.6 - 33.0 pg   MCHC 32.0 31.5 - 35.7 g/dL   RDW 59.5 63.8 - 75.6 %   Platelets 183 150 - 450 x10E3/uL   Neutrophils 62 Not Estab. %   Lymphs 29 Not Estab. %   Monocytes 6 Not Estab. %   Eos 2 Not Estab. %   Basos 1 Not Estab. %   Neutrophils Absolute 2.6 1.4 - 7.0 x10E3/uL   Lymphocytes Absolute 1.2 0.7 - 3.1 x10E3/uL  Monocytes Absolute 0.3 0.1 - 0.9 x10E3/uL   EOS (ABSOLUTE) 0.1 0.0 - 0.4 x10E3/uL   Basophils Absolute 0.0 0.0 - 0.2 x10E3/uL   Immature Granulocytes 0 Not Estab. %   Immature Grans (Abs) 0.0 0.0 - 0.1 x10E3/uL  Lipid panel   Collection Time: 12/21/23 10:37 AM  Result Value Ref Range   Cholesterol, Total 117 100 - 199 mg/dL   Triglycerides 70 0 - 149 mg/dL   HDL 54 >81 mg/dL   VLDL Cholesterol Cal 15 5 - 40 mg/dL   LDL Chol Calc (NIH) 48 0 - 99 mg/dL   Chol/HDL Ratio 2.2 0.0 - 4.4 ratio       05/24/2024    9:23 AM 04/17/2024   10:22 AM 03/27/2024    9:41 AM 02/28/2024    9:47 AM 02/06/2024   11:01 AM  Depression screen PHQ 2/9  Decreased Interest 0 0 0 0 0  Down, Depressed, Hopeless 0 0  0 0  PHQ - 2 Score 0 0 0 0 0  Altered sleeping     0   Tired, decreased energy     0  Change in appetite     0  Feeling bad or failure about yourself      0  Trouble concentrating     0  Moving slowly or fidgety/restless     0  Suicidal thoughts     0  PHQ-9 Score     0  Difficult doing work/chores     Not difficult at all       05/24/2024    9:23 AM 04/17/2024   10:22 AM 03/27/2024    9:41 AM 02/28/2024    9:47 AM  GAD 7 : Generalized Anxiety Score  Nervous, Anxious, on Edge 0 0 0 0  Control/stop worrying 0 0 0 0  Worry too much - different things 0 0 0 0  Trouble relaxing 0 0 0 0  Restless 0 0 0 0  Easily annoyed or irritable 0 0 0 0  Afraid - awful might happen 0 0 0 0  Total GAD 7 Score 0 0 0 0  Anxiety Difficulty Not difficult at all Not difficult at all Not difficult at all Not difficult at all   Pertinent labs & imaging results that were available during my care of the patient were reviewed by me and considered in my medical decision making.  Assessment & Plan:  [place] was seen today for medical management of chronic issues.  Diagnoses and all orders for this visit: 1. Primary hypertension (Primary) BP is slightly above goal. However, due to patient unable to tolerate multiple antihypertensives (lisinopril , losartan , clonidine) due to side effects will continue on current regimen and slowly work to increase dose/frequency if needed. Praised patient on dietary modifications and wearing compression stockings.  - hydrochlorothiazide  (HYDRODIURIL ) 25 MG tablet; Take 0.5 tablets (12.5 mg total) by mouth daily.  Dispense: 45 tablet; Refill: 1 - amLODipine  (NORVASC ) 2.5 MG tablet; Take 1 tablet (2.5 mg total) by mouth every Monday, Wednesday, and Friday.  Dispense: 90 tablet; Refill: 1  2. Chronic renal impairment, stage 3a (HCC) Established with Carrolyn Clan, MD. Continue to follow with specialty. Will keep patient on low dose, as needed hydrochlorothiazide , but we have discussed limited use to protect her renal function.   3.  Dyslipidemia Very well controlled. Does not wish to start a statin. Discussed alternatives with patient. Patient to read and decide about red yeast rice and zetia.  Reviewed ASCVD  risk score.   4. Hepatic cyst Per imaging 07/21/2023, Multiple scattered hepatic cysts, measuring up to 4.8 cm in the central right liver Per up to date, surveillance for symptom such as abdominal pain. No symptoms currently. Will continue to assess   Show images for CT ABDOMEN PELVIS W CONTRAST   5. Pancreatic cyst Per imaging 07/21/2023, 14 mm unilocular cyst along the posterior aspect of the pancreatic tail, likely reflecting a pseudocyst or benign side branch IPMN. Follow-up MRI abdomen with/without contrast is suggested in 2 years. Due for repeat 07/20/2025.   Show images for CT ABDOMEN PELVIS W CONTRAST   6. Renal cyst Per imaging 07/21/2023, Bilateral simple renal cysts, measuring up to 3.4 cm in the right upper kidney (series 2/image 32), benign (Bosniak I). No follow-up is recommended.  Show images for CT ABDOMEN PELVIS W CONTRAST   Continue all other maintenance medications.  Follow up plan: Return in about 3 months (around 08/24/2024) for Chronic Condition Follow up.   Continue healthy lifestyle choices, including diet (rich in fruits, vegetables, and lean proteins, and low in salt and simple carbohydrates) and exercise (at least 30 minutes of moderate physical activity daily).  Written and verbal instructions provided   The above assessment and management plan was discussed with the patient. The patient verbalized understanding of and has agreed to the management plan. Patient is aware to call the clinic if they develop any new symptoms or if symptoms persist or worsen. Patient is aware when to return to the clinic for a follow-up visit. Patient educated on when it is appropriate to go to the emergency department.   Jacqualyn Mates, DNP-FNP [facility] [address] [place], [place] [address] [phone]

## 2024-07-27 DIAGNOSIS — R079 Chest pain, unspecified: Secondary | ICD-10-CM | POA: Diagnosis not present

## 2024-07-27 DIAGNOSIS — R142 Eructation: Secondary | ICD-10-CM | POA: Diagnosis not present

## 2024-07-29 ENCOUNTER — Other Ambulatory Visit: Payer: Self-pay

## 2024-07-29 ENCOUNTER — Emergency Department (HOSPITAL_COMMUNITY)
Admission: EM | Admit: 2024-07-29 | Discharge: 2024-07-29 | Disposition: A | Discharge status: Home / Self Care | Attending: Emergency Medicine | Admitting: Emergency Medicine

## 2024-07-29 ENCOUNTER — Emergency Department (HOSPITAL_COMMUNITY)

## 2024-07-29 ENCOUNTER — Ambulatory Visit: Payer: Self-pay

## 2024-07-29 DIAGNOSIS — R079 Chest pain, unspecified: Secondary | ICD-10-CM | POA: Diagnosis not present

## 2024-07-29 DIAGNOSIS — R0789 Other chest pain: Secondary | ICD-10-CM | POA: Diagnosis not present

## 2024-07-29 DIAGNOSIS — I10 Essential (primary) hypertension: Secondary | ICD-10-CM | POA: Diagnosis not present

## 2024-07-29 DIAGNOSIS — R002 Palpitations: Secondary | ICD-10-CM | POA: Diagnosis not present

## 2024-07-29 DIAGNOSIS — R42 Dizziness and giddiness: Secondary | ICD-10-CM | POA: Diagnosis not present

## 2024-07-29 DIAGNOSIS — I700 Atherosclerosis of aorta: Secondary | ICD-10-CM | POA: Diagnosis not present

## 2024-07-29 DIAGNOSIS — Z79899 Other long term (current) drug therapy: Secondary | ICD-10-CM | POA: Insufficient documentation

## 2024-07-29 LAB — CBC WITH DIFFERENTIAL/PLATELET
Abs Immature Granulocytes: 0.02 K/uL (ref 0.00–0.07)
Basophils Absolute: 0 K/uL (ref 0.0–0.1)
Basophils Relative: 0 %
Eosinophils Absolute: 0.1 K/uL (ref 0.0–0.5)
Eosinophils Relative: 1 %
HCT: 39 % (ref 36.0–46.0)
Hemoglobin: 12.5 g/dL (ref 12.0–15.0)
Immature Granulocytes: 0 %
Lymphocytes Relative: 21 %
Lymphs Abs: 1.4 K/uL (ref 0.7–4.0)
MCH: 26 pg (ref 26.0–34.0)
MCHC: 32.1 g/dL (ref 30.0–36.0)
MCV: 81.3 fL (ref 80.0–100.0)
Monocytes Absolute: 0.5 K/uL (ref 0.1–1.0)
Monocytes Relative: 7 %
Neutro Abs: 4.8 K/uL (ref 1.7–7.7)
Neutrophils Relative %: 71 %
Platelets: 210 K/uL (ref 150–400)
RBC: 4.8 MIL/uL (ref 3.87–5.11)
RDW: 14.1 % (ref 11.5–15.5)
WBC: 6.7 K/uL (ref 4.0–10.5)
nRBC: 0 % (ref 0.0–0.2)

## 2024-07-29 LAB — TROPONIN I (HIGH SENSITIVITY)
Troponin I (High Sensitivity): 11 ng/L (ref <18)
Troponin I (High Sensitivity): 10 ng/L (ref <18)

## 2024-07-29 LAB — COMPREHENSIVE METABOLIC PANEL WITH GFR
ALT: 14 U/L (ref 0–44)
AST: 21 U/L (ref 15–41)
Albumin: 3.5 g/dL (ref 3.5–5.0)
Alkaline Phosphatase: 50 U/L (ref 38–126)
Anion gap: 11 (ref 5–15)
BUN: 14 mg/dL (ref 8–23)
CO2: 25 mmol/L (ref 22–32)
Calcium: 9.1 mg/dL (ref 8.9–10.3)
Chloride: 103 mmol/L (ref 98–111)
Creatinine, Ser: 1.05 mg/dL — ABNORMAL HIGH (ref 0.44–1.00)
GFR, Estimated: 59 mL/min — ABNORMAL LOW (ref >60)
Glucose, Bld: 92 mg/dL (ref 70–99)
Potassium: 3.2 mmol/L — ABNORMAL LOW (ref 3.5–5.1)
Sodium: 139 mmol/L (ref 135–145)
Total Bilirubin: <0.2 mg/dL (ref 0.0–1.2)
Total Protein: 7.7 g/dL (ref 6.5–8.1)

## 2024-07-29 LAB — BRAIN NATRIURETIC PEPTIDE: B Natriuretic Peptide: 119 pg/mL — ABNORMAL HIGH (ref 0.0–100.0)

## 2024-07-29 NOTE — Telephone Encounter (Signed)
 Noted

## 2024-07-29 NOTE — ED Provider Notes (Signed)
 [place] EMERGENCY DEPARTMENT AT [facility] Provider Note   CSN: [id number] Arrival date & time: 07/29/24  1005     Patient presents with: Chest Pain   Rhonda Payne is a 68 y.o. female.   HPI Patient with history of hypertension, presents with episodic chest pain, racing heart rate.  Onset was 9 days ago, since that time she initially had associated GI belching sensation which has resolved, but is subsequently developed only episodes of episodic chest pain with tachycardia like sensation. Currently she feels essentially unremarkable, but had an episode as recently as just prior to ED arrival. Patient went to urgent care 2 days ago, now presents with recurrent symptoms as above.     Prior to Admission medications   Medication Sig Start Date End Date Taking? Authorizing Provider  amLODipine  (NORVASC ) 2.5 MG tablet Take 1 tablet (2.5 mg total) by mouth every Monday, Wednesday, and Friday. 05/24/24   Milian, Marry Lenis, FNP  Cholecalciferol (VITAMIN D3 PO) Take 1 tablet by mouth daily.     [provider]  docusate sodium  (COLACE) 100 MG capsule Take 1 capsule (100 mg total) by mouth 2 (two) times daily. 07/28/23   Milian, Marry Lenis, FNP  hydrochlorothiazide  (HYDRODIURIL ) 25 MG tablet Take 0.5 tablets (12.5 mg total) by mouth daily. 05/24/24   Milian, Marry Lenis, FNP  levocetirizine (XYZAL  ALLERGY 24HR) 5 MG tablet Take 1 tablet (5 mg total) by mouth every evening. 02/03/23   Del Orbe Polanco, Iliana, FNP    Allergies: Hydrocodone -acetaminophen , Lisinopril , Losartan , and Aspirin    Review of Systems  Updated Vital Signs BP (!) 150/91   Pulse 73   Temp 98.3 F (36.8 C) (Oral)   Resp 19   Ht 5' 5 (1.651 m)   Wt 115.2 kg   SpO2 97%   BMI 42.27 kg/m   Physical Exam Vitals and nursing note reviewed.  Constitutional:      General: She is not in acute distress.    Appearance: She is well-developed. She is obese.  HENT:     Head:  Normocephalic and atraumatic.  Eyes:     Conjunctiva/sclera: Conjunctivae normal.  Cardiovascular:     Rate and Rhythm: Normal rate and regular rhythm.  Pulmonary:     Effort: Pulmonary effort is normal. No respiratory distress.     Breath sounds: Normal breath sounds. No stridor.  Abdominal:     General: There is no distension.  Skin:    General: Skin is warm and dry.  Neurological:     Mental Status: She is alert and oriented to person, place, and time.     Cranial Nerves: No cranial nerve deficit.  Psychiatric:        Mood and Affect: Mood normal.     (all labs ordered are listed, but only abnormal results are displayed) Labs Reviewed  COMPREHENSIVE METABOLIC PANEL WITH GFR - Abnormal; Notable for the following components:      Result Value   Potassium 3.2 (*)    Creatinine, Ser 1.05 (*)    GFR, Estimated 59 (*)    All other components within normal limits  BRAIN NATRIURETIC PEPTIDE - Abnormal; Notable for the following components:   B Natriuretic Peptide 119.0 (*)    All other components within normal limits  CBC WITH DIFFERENTIAL/PLATELET  TROPONIN I (HIGH SENSITIVITY)  TROPONIN I (HIGH SENSITIVITY)    EKG: EKG Interpretation Date/Time:  Monday July 29 2024 10:42:18 EDT Ventricular Rate:  93 PR Interval:  177  QRS Duration:  94 QT Interval:  352 QTC Calculation: 438 R Axis:   -11  Text Interpretation: Sinus rhythm Abnormal R-wave progression, early transition Confirmed by Garrick Charleston [phone]) on 07/29/2024 10:50:17 AM  Radiology: DG Chest 2 View Result Date: 07/29/2024 CLINICAL DATA:  Chest pain for 9 days. Palpitations and dizziness today. EXAM: CHEST - 2 VIEW COMPARISON:  Chest radiographs 05/25/2015. FINDINGS: The heart size and mediastinal contours are stable with aortic atherosclerosis. Stable mild blunting of the right costophrenic angle on the frontal examination. Mild hypoaeration of both lung bases, best seen on the lateral view. No confluent  airspace disease, significant pleural effusion or pneumothorax. There are mild degenerative changes in the spine without evidence of acute osseous abnormality. IMPRESSION: Mild hypoaeration of the lung bases. No acute cardiopulmonary process. Electronically Signed   By: Elsie Perone M.D.   On: 07/29/2024 11:52     Procedures   Medications Ordered in the ED - No data to display                                  Medical Decision Making Adult female presents with episodic chest pain, racing heart sensation. Patient is awake, alert, neurologically unremarkable, little evidence for CNS disruption, no evidence for sustained arrhythmia, though intermittent arrhythmia is a possibility/consideration. Less likely ACS given the denial of any persistent pain.  Pneumonia or other infectious or dehydration causes considered as well. Cardiac 95 sinus normal pulse ox 100% room air normal  Amount and/or Complexity of Data Reviewed External Data Reviewed: notes. Labs: ordered. Decision-making details documented in ED Course. Radiology: ordered and independent interpretation performed. Decision-making details documented in ED Course. ECG/medicine tests: ordered and independent interpretation performed. Decision-making details documented in ED Course.  Risk Prescription drug management. Decision regarding hospitalization. Diagnosis or treatment significantly limited by social determinants of health.   1:12 PM On repeat exam patient is awake, alert, in no distress.  Labs reviewed, discussed, troponin normal, EKG nonischemic, x-ray without pneumonia, no evidence for bacteremia, sepsis with reassuring findings here patient comfortable with outpatient follow-up.     Final diagnoses:  Atypical chest pain    ED Discharge Orders     None          Garrick Charleston, MD 07/29/24 1312

## 2024-07-29 NOTE — ED Triage Notes (Signed)
 Pt arrived to ED via POV. Pt states 9 days ago, she had chest pain that started. Pt woke up this morning and stated she was having palpatations that were casuing her to feel dizzy. Pt states that she's at a 3/10 pain currently. Pt NSR on monitor with occasional PACs. Lung sounds clear upon auscultation.

## 2024-07-29 NOTE — Telephone Encounter (Signed)
 FYI Only or Action Required?: FYI only for provider.  Patient was last seen in primary care on 05/24/2024 by Rhonda Marry Lenis, FNP.  Called Nurse Triage reporting Gastroesophageal Reflux and Palpitations.  Symptoms began July 20, 2024.  Interventions attempted: Prescription medications: Gas-x and Rest, hydration, or home remedies.  Symptoms are: gradually worsening.  Triage Disposition: Go to ED Now (or PCP Triage)  Patient/caregiver understands and will follow disposition?: Yes  Copied from CRM [id number]. Topic: Clinical - Red Word Triage >> Jul 29, 2024  8:20 AM Rhonda Payne wrote: Red Word that prompted transfer to Nurse Triage: Patient went to Urgent Care on Saturday. Patient is still having issues with soreness in chest, belching, heart fluttering, dizzy, soreness in back. Having [name] going to sleep, not eating Payne lot. The prescriber Saturday prescribed Gas X and gave her Payne EKG, did not see anything wrong with her heart. Reason for Disposition  [1] Chest pain lasts > 5 minutes AND [2] occurred in past 3 days (72 hours) (Exception: Feels exactly the same as previously diagnosed heartburn and has accompanying sour taste in mouth.)  Answer Assessment - Initial Assessment Questions 1. LOCATION: Where does it hurt?      Middle of breast bone when palpitations occur 2. RADIATION: Does the pain go anywhere else? (e.g., into neck, jaw, arms, back)     back 3. ONSET: When did the chest pain begin? (Minutes, hours or days)      Started August 9th 4. PATTERN: Does the pain come and go, or has it been constant since it started?  Does it get worse with exertion?      Comes and goes 5. DURATION: How long does it last (e.g., seconds, minutes, hours)     Lasts for maybe [name] 6. SEVERITY: How bad is the pain?  (e.g., Scale 1-10; mild, moderate, or severe)     No pain currently 7. CARDIAC RISK FACTORS: Do you have any history of heart problems or risk  factors for heart disease? (e.g., angina, prior heart attack; diabetes, high blood pressure, high cholesterol, smoker, or strong family history of heart disease)     HTN, patient's children have cardiac issues.  8. PULMONARY RISK FACTORS: Do you have any history of lung disease?  (e.g., blood clots in lung, asthma, emphysema, birth control pills)     no 9. CAUSE: What do you think is causing the chest pain?     unsure 10. OTHER SYMPTOMS: Do you have any other symptoms? (e.g., dizziness, nausea, vomiting, sweating, fever, difficulty breathing, cough)       Palpitations, dizziness, belching  Patient was seen in Urgent Care on Saturday. Given Maalox with viscous lidocaine -patient states helped somewhat. Patient was discharged after getting an EKG. Given Gas-X prescription which patient states didn't help. Recommended to Emergency Department for evaluation which patient verbalized and agreed with.  Protocols used: Chest Pain-Payne-AH

## 2024-07-29 NOTE — Discharge Instructions (Addendum)
 As discussed, your evaluation today has been largely reassuring.  But, it is important that you monitor your condition carefully, and do not hesitate to return to the ED if you develop new, or concerning changes in your condition. ? ?Otherwise, please follow-up with your physician for appropriate ongoing care. ? ?

## 2024-08-28 ENCOUNTER — Ambulatory Visit: Admitting: Family Medicine

## 2024-08-28 ENCOUNTER — Encounter: Payer: Self-pay | Admitting: Family Medicine

## 2024-08-28 VITALS — BP 131/81 | HR 69 | Temp 98.3°F | Ht 65.0 in | Wt 255.2 lb

## 2024-08-28 DIAGNOSIS — N1831 Chronic kidney disease, stage 3a: Secondary | ICD-10-CM | POA: Diagnosis not present

## 2024-08-28 DIAGNOSIS — E785 Hyperlipidemia, unspecified: Secondary | ICD-10-CM

## 2024-08-28 DIAGNOSIS — Z0001 Encounter for general adult medical examination with abnormal findings: Secondary | ICD-10-CM

## 2024-08-28 DIAGNOSIS — I10 Essential (primary) hypertension: Secondary | ICD-10-CM | POA: Diagnosis not present

## 2024-08-28 DIAGNOSIS — Z0000 Encounter for general adult medical examination without abnormal findings: Secondary | ICD-10-CM

## 2024-08-28 DIAGNOSIS — E559 Vitamin D deficiency, unspecified: Secondary | ICD-10-CM

## 2024-08-28 DIAGNOSIS — I700 Atherosclerosis of aorta: Secondary | ICD-10-CM

## 2024-08-28 LAB — LIPID PANEL

## 2024-08-28 LAB — BAYER DCA HB A1C WAIVED: HB A1C (BAYER DCA - WAIVED): 5.3 % (ref 4.8–5.6)

## 2024-08-28 NOTE — Progress Notes (Signed)
 Complete physical exam  Patient: Rhonda Payne   DOB: October 06, 1956   68 y.o. Female  MRN: [id number]  Subjective:    Chief Complaint  Patient presents with   Medical Management of Chronic Issues   Annual Exam    Rhonda Payne is a 68 y.o. female who presents today for a complete physical exam. She reports consuming a general diet. The patient does not participate in regular exercise at present. She generally feels well. She reports sleeping fairly well. She does not have additional problems to discuss today.   Hypertension - Blood pressure slightly elevated during the visit - Reports white coat syndrome - Blood pressure usually well-controlled at home with amlodipine  and hydrochlorothiazide  - No chest pain, shortness of breath, trouble swallowing, or peripheral edema  Anxiety and sleep disturbance - Anxiety affecting sleep, related to personal losses - Feels she is managing well despite these challenges  Diet and physical activity - Diet includes increased vegetables and limited meat consumption - Walks on a treadmill at home, though not frequently  Chronic kidney disease - Established with nephrology  Vitamin supplementation - Takes vitamin D3 supplement  Aspirin intolerance and bleeding history - History of bleeding and blood clots in her thirties associated with aspirin use and hypertension diagnosis - Does not currently take aspirin - No prior testing for a bleeding disorder      Most recent fall risk assessment:    08/28/2024   10:20 AM  Fall Risk   Falls in the past year? 0     Most recent depression screenings:    08/28/2024   10:20 AM 05/24/2024    9:23 AM  PHQ 2/9 Scores  PHQ - 2 Score 0 0  PHQ- 9 Score 0     Vision:Within last year    Patient Care Team: Joesph Annabella HERO, FNP as PCP - General (Family Medicine)   Outpatient Medications Prior to Visit  Medication Sig   amLODipine  (NORVASC ) 2.5 MG tablet Take 1 tablet (2.5 mg total) by mouth  every Monday, Wednesday, and Friday.   Cholecalciferol (VITAMIN D3 PO) Take 1 tablet by mouth daily.    docusate sodium  (COLACE) 100 MG capsule Take 1 capsule (100 mg total) by mouth 2 (two) times daily.   hydrochlorothiazide  (HYDRODIURIL ) 25 MG tablet Take 0.5 tablets (12.5 mg total) by mouth daily.   levocetirizine (XYZAL  ALLERGY 24HR) 5 MG tablet Take 1 tablet (5 mg total) by mouth every evening.   No facility-administered medications prior to visit.    ROS Negative unless specially indicated above in HPI.     Objective:     BP 131/81 Comment: at home reading per pt  Pulse 69   Temp 98.3 F (36.8 C) (Temporal)   Ht 5' 5 (1.651 m)   Wt 255 lb 3.2 oz (115.8 kg)   SpO2 97%   BMI 42.47 kg/m    Physical Exam Vitals and nursing note reviewed.  Constitutional:      General: She is not in acute distress.    Appearance: She is obese. She is not ill-appearing, toxic-appearing or diaphoretic.  HENT:     Head: Normocephalic.     Right Ear: Tympanic membrane, ear canal and external ear normal.     Left Ear: Tympanic membrane, ear canal and external ear normal.     Nose: Nose normal.     Mouth/Throat:     Mouth: Mucous membranes are moist.     Pharynx: Oropharynx is clear.  Eyes:  Extraocular Movements: Extraocular movements intact.     Conjunctiva/sclera: Conjunctivae normal.     Pupils: Pupils are equal, round, and reactive to light.  Neck:     Thyroid: No thyroid mass, thyromegaly or thyroid tenderness.  Cardiovascular:     Rate and Rhythm: Normal rate and regular rhythm.     Pulses: Normal pulses.     Heart sounds: Normal heart sounds. No murmur heard.    No friction rub. No gallop.  Pulmonary:     Effort: Pulmonary effort is normal.     Breath sounds: Normal breath sounds.  Abdominal:     General: Bowel sounds are normal. There is no distension.     Palpations: Abdomen is soft. There is no mass.     Tenderness: There is no abdominal tenderness. There is no  guarding.  Musculoskeletal:     Cervical back: Normal range of motion and neck supple. No tenderness.     Right lower leg: No edema.     Left lower leg: No edema.  Skin:    General: Skin is warm and dry.     Capillary Refill: Capillary refill takes less than 2 seconds.     Findings: No lesion or rash.  Neurological:     General: No focal deficit present.     Mental Status: She is alert and oriented to person, place, and time.     Cranial Nerves: No cranial nerve deficit.     Motor: No weakness.     Gait: Gait normal.  Psychiatric:        Mood and Affect: Mood normal.        Behavior: Behavior normal.        Thought Content: Thought content normal.        Judgment: Judgment normal.      No results found for any visits on 08/28/24.     Assessment & Plan:    Routine Health Maintenance and Physical Exam  Rhonda Payne was seen today for medical management of chronic issues and annual exam.  Diagnoses and all orders for this visit:  Routine general medical examination at a health care facility  Primary hypertension Home BP at goal. Diet, exercise, weight loss. Labs pending.  -     CBC with Differential/Platelet -     CMP14+EGFR -     TSH  Morbid obesity (HCC) Diet, exercise, weight loss.  -     Bayer DCA Hb A1c Waived  Dyslipidemia On statin.  -     Lipid panel  CKD stage 3a, GFR 45-59 ml/min (HCC) Established with nephrology.  -     CBC with Differential/Platelet -     CMP14+EGFR -     VITAMIN D  25 Hydroxy (Vit-D Deficiency, Fractures)  Aortic atherosclerosis (HCC) Doesn't tolerate aspirin. On statin.   Vitamin D  deficiency On oral supplement.  -     VITAMIN D  25 Hydroxy (Vit-D Deficiency, Fractures)    There is no immunization history on file for this patient.  Health Maintenance  Topic Date Due   Medicare Annual Wellness (AWV)  09/12/2024   DTaP/Tdap/Td (1 - Tdap) 09/12/2024 (Originally 01/09/1977)   Influenza Vaccine  03/11/2025 (Originally 07/12/2024)    Pneumococcal Vaccine: 50+ Years (1 of 2 - PCV) 03/27/2025 (Originally 01/09/1977)   Mammogram  03/27/2025 (Originally 01/09/1998)   Colonoscopy  03/27/2025 (Originally 01/09/2003)   COVID-19 Vaccine (1 - 2024-25 season) 09/13/2025 (Originally 08/12/2024)   Zoster Vaccines- Shingrix (1 of 2) 11/27/2025 (Originally 01/10/2008)   DEXA  SCAN  12/20/2025   Hepatitis C Screening  Completed   HPV VACCINES  Aged Out   Meningococcal B Vaccine  Aged Out    Discussed health benefits of physical activity, and encouraged her to engage in regular exercise appropriate for her age and condition.  Problem List Items Addressed This Visit       Cardiovascular and Mediastinum   Primary hypertension   Relevant Orders   CBC with Differential/Platelet   CMP14+EGFR   TSH     Genitourinary   CKD stage 3a, GFR 45-59 ml/min (HCC)     Other   Dyslipidemia   Relevant Orders   Lipid panel   Morbid obesity (HCC)   Relevant Orders   Bayer DCA Hb A1c Waived   Other Visit Diagnoses       Routine general medical examination at a health care facility    -  Primary     Aortic atherosclerosis (HCC)         Vitamin D  deficiency       Relevant Orders   VITAMIN D  25 Hydroxy (Vit-D Deficiency, Fractures)      Return in 6 months (on 02/25/2025) for chronic follow up.   The patient indicates understanding of these issues and agrees with the plan.  Annabella CHRISTELLA Search, FNP

## 2024-08-28 NOTE — Patient Instructions (Signed)

## 2024-08-29 ENCOUNTER — Telehealth: Payer: Self-pay

## 2024-08-29 ENCOUNTER — Ambulatory Visit: Payer: Self-pay | Admitting: Family Medicine

## 2024-08-29 LAB — CMP14+EGFR
ALT: 11 IU/L (ref 0–32)
AST: 23 IU/L (ref 0–40)
Albumin: 4.1 g/dL (ref 3.9–4.9)
Alkaline Phosphatase: 60 IU/L (ref 49–135)
BUN/Creatinine Ratio: 17 (ref 12–28)
BUN: 18 mg/dL (ref 8–27)
Bilirubin Total: 1 mg/dL (ref 0.0–1.2)
CO2: 24 mmol/L (ref 20–29)
Calcium: 9.7 mg/dL (ref 8.7–10.3)
Chloride: 105 mmol/L (ref 96–106)
Creatinine, Ser: 1.07 mg/dL — AB (ref 0.57–1.00)
Globulin, Total: 3.5 g/dL (ref 1.5–4.5)
Glucose: 93 mg/dL (ref 70–99)
Potassium: 4.5 mmol/L (ref 3.5–5.2)
Sodium: 143 mmol/L (ref 134–144)
Total Protein: 7.6 g/dL (ref 6.0–8.5)
eGFR: 57 mL/min/1.73 — AB (ref >59)

## 2024-08-29 LAB — TSH: TSH: 2.81 u[IU]/mL (ref 0.450–4.500)

## 2024-08-29 LAB — CBC WITH DIFFERENTIAL/PLATELET
Basophils Absolute: 0 x10E3/uL (ref 0.0–0.2)
Basos: 1 %
EOS (ABSOLUTE): 0.2 x10E3/uL (ref 0.0–0.4)
Eos: 4 %
Hematocrit: 43.7 % (ref 34.0–46.6)
Hemoglobin: 13.6 g/dL (ref 11.1–15.9)
Immature Grans (Abs): 0 x10E3/uL (ref 0.0–0.1)
Immature Granulocytes: 0 %
Lymphocytes Absolute: 1.6 x10E3/uL (ref 0.7–3.1)
Lymphs: 36 %
MCH: 25.6 pg — ABNORMAL LOW (ref 26.6–33.0)
MCHC: 31.1 g/dL — ABNORMAL LOW (ref 31.5–35.7)
MCV: 82 fL (ref 79–97)
Monocytes Absolute: 0.3 x10E3/uL (ref 0.1–0.9)
Monocytes: 6 %
Neutrophils Absolute: 2.4 x10E3/uL (ref 1.4–7.0)
Neutrophils: 53 %
Platelets: 200 x10E3/uL (ref 150–450)
RBC: 5.32 x10E6/uL — ABNORMAL HIGH (ref 3.77–5.28)
RDW: 14.5 % (ref 11.7–15.4)
WBC: 4.6 x10E3/uL (ref 3.4–10.8)

## 2024-08-29 LAB — VITAMIN D 25 HYDROXY (VIT D DEFICIENCY, FRACTURES): Vit D, 25-Hydroxy: 29.3 ng/mL — AB (ref 30.0–100.0)

## 2024-08-29 LAB — LIPID PANEL
Cholesterol, Total: 203 mg/dL — AB (ref 100–199)
HDL: 50 mg/dL (ref >39)
LDL CALC COMMENT:: 4.1 ratio (ref 0.0–4.4)
LDL Chol Calc (NIH): 135 mg/dL — AB (ref 0–99)
Triglycerides: 102 mg/dL (ref 0–149)
VLDL Cholesterol Cal: 18 mg/dL (ref 5–40)

## 2024-08-29 NOTE — Telephone Encounter (Signed)
 Copied from CRM [id number]. Topic: Clinical - Lab/Test Results >> Aug 29, 2024  3:43 PM Mia F wrote: Reason for CRM: Pt called back and lab results were relayed. Pt agrees to start Crestor . She is asking if she can do 10MG  instead of 20MG .  Pt says she takes Vitamin D3 5000IU per day. Please send to [facility] [address] [place] - [address] [place] [address] Phone: [phone] Fax: [phone]

## 2024-08-29 NOTE — Telephone Encounter (Signed)
 Ok to start with 10 mg if she prefers.

## 2024-08-29 NOTE — Telephone Encounter (Signed)
 Please advise on dose change

## 2024-08-30 MED ORDER — ROSUVASTATIN CALCIUM 10 MG PO TABS: 10.0000 mg | ORAL_TABLET | Freq: Every day | ORAL | 1 refills | Status: DC

## 2024-08-30 NOTE — Telephone Encounter (Signed)
 Crestor  10mg  sent to pharmacy. Called and left detailed message on patients voicemail that rx was called in and to contact the office with any questions

## 2024-08-30 NOTE — Addendum Note (Signed)
 Addended by: VIKTORIA ALAN MATSU on: 08/30/2024 02:19 PM   Modules accepted: Orders

## 2024-09-25 ENCOUNTER — Ambulatory Visit

## 2024-11-21 ENCOUNTER — Ambulatory Visit: Admitting: Family Medicine

## 2024-12-16 ENCOUNTER — Ambulatory Visit: Payer: Self-pay

## 2024-12-20 ENCOUNTER — Ambulatory Visit: Admitting: Family Medicine

## 2024-12-20 ENCOUNTER — Encounter: Payer: Self-pay | Admitting: Family Medicine

## 2024-12-20 VITALS — BP 151/82 | HR 74 | Temp 97.5°F | Wt 258.4 lb

## 2024-12-20 DIAGNOSIS — I10 Essential (primary) hypertension: Secondary | ICD-10-CM

## 2024-12-20 DIAGNOSIS — E785 Hyperlipidemia, unspecified: Secondary | ICD-10-CM

## 2024-12-20 DIAGNOSIS — N1831 Chronic kidney disease, stage 3a: Secondary | ICD-10-CM | POA: Diagnosis not present

## 2024-12-20 NOTE — Progress Notes (Signed)
 "  Established Patient Office Visit  Subjective   Patient ID: Rhonda Payne, female    DOB: [birth year]  Age: 69 y.o. MRN: [id number]  Chief Complaint  Patient presents with   Medical Management of Chronic Issues    HPI  History of Present Illness   Rhonda Payne is a 69 year old female with hyperlipidemia and hypertension who presents for a follow-up on her cholesterol medication and blood pressure management.  Hyperlipidemia - Started crestor  a few months ago - Tolerating without side effects - Diet excludes meat except for occasional turkey - High intake of vegetables  - Avoids fast food - Reduced intake of sweets - Regular consumption of apple juice, orange juice, and cranberry juice  Hypertension and antihypertensive medication side effects - Blood pressure readings often elevated in office settings - Home blood pressure readings usually normal though she hasn't taken her blood pressure in some time as she becomes obsessive about it - Experiences anxiety in medical settings, attributed to past experiences - No chest pain or shortness of breath - Amlodipine  taken three times a week due to side effects when taken daily - Hydrochlorothiazide  used as needed for swelling in feet and ankles  Exercise routine - Recently started using a stamina bike for exercise - Rides for approximately thirty minutes daily  Vitamin d  supplementation and gastrointestinal effects - Takes vitamin D  supplement 5000 IU every other day - Constipation occurs when taken daily  Ophthalmologic symptoms - No recent headaches - History of floaters in vision        ROS As per HPI.    Objective:     BP (!) 151/82   Pulse 74   Temp (!) 97.5 F (36.4 C) (Temporal)   Wt 258 lb 6.4 oz (117.2 kg)   SpO2 97%   BMI 43.00 kg/m  BP Readings from Last 3 Encounters:  12/20/24 (!) 151/82  08/28/24 131/81  07/29/24 (!) 150/91   Wt Readings from Last 3 Encounters:  12/20/24 258 lb 6.4 oz  (117.2 kg)  08/28/24 255 lb 3.2 oz (115.8 kg)  07/29/24 254 lb (115.2 kg)     Physical Exam Vitals and nursing note reviewed.  Constitutional:      General: She is not in acute distress.    Appearance: She is obese. She is not ill-appearing, toxic-appearing or diaphoretic.  Cardiovascular:     Rate and Rhythm: Normal rate and regular rhythm.     Heart sounds: Normal heart sounds. No murmur heard. Pulmonary:     Effort: Pulmonary effort is normal. No respiratory distress.     Breath sounds: Normal breath sounds. No wheezing, rhonchi or rales.  Musculoskeletal:     Right lower leg: No edema.     Left lower leg: No edema.  Skin:    General: Skin is warm and dry.  Neurological:     General: No focal deficit present.     Mental Status: She is alert and oriented to person, place, and time.  Psychiatric:        Mood and Affect: Mood normal.        Behavior: Behavior normal.      No results found for any visits on 12/20/24.    The 10-year ASCVD risk score (Arnett DK, et al., 2019) is: 14.3%    Assessment & Plan:   Kobe was seen today for medical management of chronic issues.  Diagnoses and all orders for this visit:  Dyslipidemia -     Lipid panel  CKD stage 3a, GFR 45-59 ml/min (HCC) -     CMP14+EGFR -     VITAMIN D  25 Hydroxy (Vit-D Deficiency, Fractures)  Primary hypertension  Morbid obesity (HCC)   Assessment and Plan    Dyslipidemia Now on statin.  - Rechecked cholesterol levels.  Primary hypertension Elevated blood pressure today, likely due to anxiety. Current regimen includes amlodipine  3x a week and hydrochlorothiazide  as needed. - Check blood pressure at home once daily over the weekend. - Call on Monday with home blood pressure readings.  Chronic kidney disease, stage 3a Stage 3a CKD monitored.  - Checked kidney and liver function with lab work.     Morbid obesity Well balanced diet with daily exercise encouraged.    Return in about 3  months (around 03/20/2025) for chronic follow up.   The patient indicates understanding of these issues and agrees with the plan.  Rhonda CHRISTELLA Search, FNP "

## 2024-12-21 LAB — CMP14+EGFR
ALT: 11 IU/L (ref 0–32)
AST: 19 IU/L (ref 0–40)
Albumin: 4 g/dL (ref 3.9–4.9)
Alkaline Phosphatase: 62 IU/L (ref 49–135)
BUN/Creatinine Ratio: 10 — ABNORMAL LOW (ref 12–28)
BUN: 10 mg/dL (ref 8–27)
Bilirubin Total: 1.1 mg/dL (ref 0.0–1.2)
CO2: 25 mmol/L (ref 20–29)
Calcium: 9.1 mg/dL (ref 8.7–10.3)
Chloride: 104 mmol/L (ref 96–106)
Creatinine, Ser: 1.02 mg/dL — ABNORMAL HIGH (ref 0.57–1.00)
Globulin, Total: 3 g/dL (ref 1.5–4.5)
Glucose: 90 mg/dL (ref 70–99)
Potassium: 3.8 mmol/L (ref 3.5–5.2)
Sodium: 142 mmol/L (ref 134–144)
Total Protein: 7 g/dL (ref 6.0–8.5)
eGFR: 61 mL/min/1.73

## 2024-12-21 LAB — VITAMIN D 25 HYDROXY (VIT D DEFICIENCY, FRACTURES): Vit D, 25-Hydroxy: 32.6 ng/mL (ref 30.0–100.0)

## 2024-12-21 LAB — LIPID PANEL
Chol/HDL Ratio: 3.8 ratio (ref 0.0–4.4)
Cholesterol, Total: 163 mg/dL (ref 100–199)
HDL: 43 mg/dL
LDL Chol Calc (NIH): 103 mg/dL — ABNORMAL HIGH (ref 0–99)
Triglycerides: 90 mg/dL (ref 0–149)
VLDL Cholesterol Cal: 17 mg/dL (ref 5–40)

## 2024-12-23 ENCOUNTER — Ambulatory Visit: Payer: Self-pay | Admitting: Family Medicine

## 2024-12-23 DIAGNOSIS — E785 Hyperlipidemia, unspecified: Secondary | ICD-10-CM

## 2024-12-23 MED ORDER — ROSUVASTATIN CALCIUM 20 MG PO TABS: 20.0000 mg | ORAL_TABLET | Freq: Every day | ORAL | 3 refills | Status: AC

## 2025-01-09 ENCOUNTER — Ambulatory Visit

## 2025-01-21 ENCOUNTER — Ambulatory Visit

## 2025-03-24 ENCOUNTER — Other Ambulatory Visit
